# Patient Record
Sex: Female | Born: 1960 | Race: White | Hispanic: No | Marital: Married | Smoking: Never smoker
Health system: Southern US, Community
[De-identification: ages and names within clinical notes are randomized; demographics above are authoritative.]

## PROBLEM LIST (undated history)

## (undated) DIAGNOSIS — Z8489 Family history of other specified conditions: Secondary | ICD-10-CM

## (undated) DIAGNOSIS — K529 Noninfective gastroenteritis and colitis, unspecified: Secondary | ICD-10-CM

## (undated) DIAGNOSIS — Z923 Personal history of irradiation: Secondary | ICD-10-CM

## (undated) DIAGNOSIS — Z9221 Personal history of antineoplastic chemotherapy: Secondary | ICD-10-CM

## (undated) DIAGNOSIS — D1803 Hemangioma of intra-abdominal structures: Secondary | ICD-10-CM

## (undated) DIAGNOSIS — C50919 Malignant neoplasm of unspecified site of unspecified female breast: Secondary | ICD-10-CM

## (undated) DIAGNOSIS — F419 Anxiety disorder, unspecified: Secondary | ICD-10-CM

## (undated) DIAGNOSIS — K219 Gastro-esophageal reflux disease without esophagitis: Secondary | ICD-10-CM

## (undated) DIAGNOSIS — I10 Essential (primary) hypertension: Secondary | ICD-10-CM

## (undated) DIAGNOSIS — F32A Depression, unspecified: Secondary | ICD-10-CM

## (undated) DIAGNOSIS — K635 Polyp of colon: Secondary | ICD-10-CM

## (undated) HISTORY — DX: Hemangioma of intra-abdominal structures: D18.03

## (undated) HISTORY — PX: ABDOMINAL HYSTERECTOMY: SHX81

## (undated) HISTORY — PX: NASAL SINUS SURGERY: SHX719

## (undated) HISTORY — DX: Noninfective gastroenteritis and colitis, unspecified: K52.9

## (undated) HISTORY — PX: OTHER SURGICAL HISTORY: SHX169

## (undated) HISTORY — DX: Essential (primary) hypertension: I10

## (undated) HISTORY — DX: Polyp of colon: K63.5

---

## 2002-04-22 ENCOUNTER — Encounter: Payer: Self-pay | Admitting: Neurology

## 2002-04-22 ENCOUNTER — Encounter: Admission: RE | Admit: 2002-04-22 | Discharge: 2002-04-22 | Payer: Self-pay | Admitting: Neurology

## 2003-05-30 ENCOUNTER — Encounter: Admission: RE | Admit: 2003-05-30 | Discharge: 2003-05-30 | Payer: Self-pay | Admitting: Neurology

## 2004-07-30 ENCOUNTER — Encounter: Admission: RE | Admit: 2004-07-30 | Discharge: 2004-07-30 | Payer: Self-pay | Admitting: Neurology

## 2005-09-19 ENCOUNTER — Encounter: Admission: RE | Admit: 2005-09-19 | Discharge: 2005-09-19 | Payer: Self-pay | Admitting: Neurology

## 2006-10-07 ENCOUNTER — Encounter: Admission: RE | Admit: 2006-10-07 | Discharge: 2006-10-07 | Payer: Self-pay | Admitting: Neurology

## 2007-10-11 ENCOUNTER — Encounter: Admission: RE | Admit: 2007-10-11 | Discharge: 2007-10-11 | Payer: Self-pay | Admitting: Neurology

## 2008-04-07 DIAGNOSIS — K529 Noninfective gastroenteritis and colitis, unspecified: Secondary | ICD-10-CM

## 2008-04-07 DIAGNOSIS — D1803 Hemangioma of intra-abdominal structures: Secondary | ICD-10-CM

## 2008-04-07 HISTORY — DX: Hemangioma of intra-abdominal structures: D18.03

## 2008-04-07 HISTORY — DX: Noninfective gastroenteritis and colitis, unspecified: K52.9

## 2008-10-19 ENCOUNTER — Encounter: Admission: RE | Admit: 2008-10-19 | Discharge: 2008-10-19 | Payer: Self-pay | Admitting: Neurology

## 2009-01-21 ENCOUNTER — Emergency Department (HOSPITAL_COMMUNITY): Admission: EM | Admit: 2009-01-21 | Discharge: 2009-01-21 | Payer: Self-pay | Admitting: Emergency Medicine

## 2009-01-24 ENCOUNTER — Encounter (INDEPENDENT_AMBULATORY_CARE_PROVIDER_SITE_OTHER): Payer: Self-pay | Admitting: *Deleted

## 2009-11-07 ENCOUNTER — Encounter: Admission: RE | Admit: 2009-11-07 | Discharge: 2009-11-07 | Payer: Self-pay | Admitting: Neurology

## 2010-04-28 ENCOUNTER — Encounter: Payer: Self-pay | Admitting: Interventional Radiology

## 2010-07-11 LAB — CBC
Hemoglobin: 14 g/dL (ref 12.0–15.0)
MCV: 95.1 fL (ref 78.0–100.0)
Platelets: 295 10*3/uL (ref 150–400)
RBC: 4.31 MIL/uL (ref 3.87–5.11)
RDW: 13.1 % (ref 11.5–15.5)

## 2010-07-11 LAB — COMPREHENSIVE METABOLIC PANEL
Albumin: 4.2 g/dL (ref 3.5–5.2)
Chloride: 101 mEq/L (ref 96–112)
Creatinine, Ser: 0.86 mg/dL (ref 0.4–1.2)
Total Bilirubin: 1.3 mg/dL — ABNORMAL HIGH (ref 0.3–1.2)
Total Protein: 7.7 g/dL (ref 6.0–8.3)

## 2010-07-11 LAB — DIFFERENTIAL
Basophils Absolute: 0.2 10*3/uL — ABNORMAL HIGH (ref 0.0–0.1)
Eosinophils Absolute: 0 10*3/uL (ref 0.0–0.7)
Eosinophils Relative: 0 % (ref 0–5)
Lymphocytes Relative: 7 % — ABNORMAL LOW (ref 12–46)

## 2010-09-26 ENCOUNTER — Encounter: Payer: Self-pay | Admitting: Internal Medicine

## 2010-11-14 ENCOUNTER — Ambulatory Visit (INDEPENDENT_AMBULATORY_CARE_PROVIDER_SITE_OTHER): Payer: BLUE CROSS/BLUE SHIELD | Admitting: Internal Medicine

## 2010-11-14 ENCOUNTER — Encounter: Payer: Self-pay | Admitting: Internal Medicine

## 2010-11-14 DIAGNOSIS — K625 Hemorrhage of anus and rectum: Secondary | ICD-10-CM

## 2010-11-14 DIAGNOSIS — R198 Other specified symptoms and signs involving the digestive system and abdomen: Secondary | ICD-10-CM

## 2010-11-14 DIAGNOSIS — R933 Abnormal findings on diagnostic imaging of other parts of digestive tract: Secondary | ICD-10-CM

## 2010-11-14 DIAGNOSIS — R194 Change in bowel habit: Secondary | ICD-10-CM

## 2010-11-14 MED ORDER — PEG-KCL-NACL-NASULF-NA ASC-C 100 G PO SOLR: 1.0000 | Freq: Once | ORAL | Status: DC

## 2010-11-14 NOTE — Patient Instructions (Signed)
You have been scheduled for a colonoscopy. Please follow written instructions given to you at your visit today.  Please pick up your Moviprep kit at the pharmacy within the next 2-3 days. CC: Dr Marcy Siren

## 2010-11-14 NOTE — Progress Notes (Signed)
Erika [name] [birth year] MRN [id number]    History of Present Illness:  This is a 50 year old white female with a change in bowel habits toward loose and more frequent stools. She also sees mucous and blood per rectum. This has been occurring for about 6 months. There has been no weight loss or abdominal pain. She occasionally has rectal soreness and itching. She denies any history of hemorrhoids. Her father had colon polyps and underwent several colonoscopies with findings of hyperplastic and adenomatous polyps. She has a history of acute colitis dating back to 2010. A CT scan of the abdomen in the emergency room showed thickening of the left colon consistent with acute colitis. She takes Imodium when necessary.   Past Medical History  Diagnosis Date  . Hypertension   . Colitis 2010    seen on CT (at ER visit)  . Liver hemangioma 2010    seen on CT   Past Surgical History  Procedure Date  . Cesarean section   . "freezing of cervix"     reports that she has quit smoking. She has never used smokeless tobacco. She reports that she drinks alcohol. She reports that she does not use illicit drugs. family history includes Colon polyps in her father and sister; Diabetes in her paternal grandmother; Heart disease in her father; and Hypertension in her brother and father. Allergies  Allergen Reactions  . Penicillins Nausea Only        Review of Systems: Denies any upper GI symptoms of heartburn, dysphagia, negative for shortness of breath or chest pain  The remainder of the 10  point ROS is negative except as outlined in H&P   Physical Exam: General appearance  Well developed, in no distress. Eyes- non icteric. HEENT nontraumatic, normocephalic. Mouth no lesions, tongue papillated, no cheilosis. Neck supple without adenopathy, thyroid not enlarged, no carotid bruits, no JVD. Lungs Clear to auscultation bilaterally. Cor normal S1 normal S2, regular rhythm , no murmur,  quiet  precordium. Abdomen soft relaxed abdomen with no particular tenderness. No distention. Bowel sounds are normal. Liver edge at costal margin. Rectal: And anoscopic exam reveals normal perianal area. Normal rectal sphincter tone. Normal rectal ampulla with no evidence of proctitis or hemorrhoids. There is no prolapse of the mucosa. Stool is Hemoccult negative. Extremities no pedal edema. Skin no lesions. Neurological alert and oriented x 3. Psychological normal mood and affect.  Assessment and Plan:  Problem #1 Change in bowel habits and intermittent rectal bleeding not documented on today's exam. Patient has a recent history of an abnormal CT scan suggestive of acute colitis. I suspect either segmental colitis or a left colon polyp which produces some mucus. She will be 50 years old this year this year and has a family history of colon polyps. He will proceed with a colonoscopy. I have discussed the procedure, the sedation and the prep with the patient.   11/14/2010 Lina Sar

## 2010-12-06 ENCOUNTER — Other Ambulatory Visit: Payer: Self-pay | Admitting: Neurology

## 2010-12-06 DIAGNOSIS — Z1231 Encounter for screening mammogram for malignant neoplasm of breast: Secondary | ICD-10-CM

## 2010-12-13 ENCOUNTER — Ambulatory Visit (AMBULATORY_SURGERY_CENTER): Payer: BLUE CROSS/BLUE SHIELD | Admitting: Internal Medicine

## 2010-12-13 ENCOUNTER — Encounter: Payer: Self-pay | Admitting: Internal Medicine

## 2010-12-13 DIAGNOSIS — D126 Benign neoplasm of colon, unspecified: Secondary | ICD-10-CM

## 2010-12-13 DIAGNOSIS — K625 Hemorrhage of anus and rectum: Secondary | ICD-10-CM

## 2010-12-13 DIAGNOSIS — R198 Other specified symptoms and signs involving the digestive system and abdomen: Secondary | ICD-10-CM

## 2010-12-13 DIAGNOSIS — Z8371 Family history of colonic polyps: Secondary | ICD-10-CM

## 2010-12-13 MED ORDER — SODIUM CHLORIDE 0.9 % IV SOLN: 500.0000 mL | INTRAVENOUS | Status: DC

## 2010-12-13 NOTE — Progress Notes (Signed)
PT IS REQUESTING A PEDIATRIC SCOPE IF POSSIBLE. EWM

## 2010-12-13 NOTE — Patient Instructions (Signed)
RESUME ALL MEDICATIONS. INFORMATION GIVEN ON POLYPS AND HIGH FIBER DIET. INFORMATION ON IBS GIVEN. D/C INSTRUCTIONS GIVEN.

## 2010-12-16 ENCOUNTER — Telehealth: Payer: Self-pay | Admitting: *Deleted

## 2010-12-16 NOTE — Telephone Encounter (Signed)
Identifier on home phone, message left.

## 2010-12-18 ENCOUNTER — Encounter: Payer: Self-pay | Admitting: Internal Medicine

## 2010-12-26 ENCOUNTER — Ambulatory Visit: Payer: BLUE CROSS/BLUE SHIELD

## 2010-12-30 ENCOUNTER — Ambulatory Visit: Payer: BLUE CROSS/BLUE SHIELD

## 2010-12-31 ENCOUNTER — Ambulatory Visit: Payer: BLUE CROSS/BLUE SHIELD

## 2011-01-06 ENCOUNTER — Ambulatory Visit
Admission: RE | Admit: 2011-01-06 | Discharge: 2011-01-06 | Disposition: A | Discharge status: Home / Self Care | Payer: BC Managed Care – PPO | Source: Ambulatory Visit | Attending: Neurology | Admitting: Neurology

## 2011-01-06 DIAGNOSIS — Z1231 Encounter for screening mammogram for malignant neoplasm of breast: Secondary | ICD-10-CM

## 2011-12-29 ENCOUNTER — Other Ambulatory Visit: Payer: Self-pay | Admitting: Neurology

## 2011-12-29 DIAGNOSIS — Z1231 Encounter for screening mammogram for malignant neoplasm of breast: Secondary | ICD-10-CM

## 2012-01-13 ENCOUNTER — Ambulatory Visit
Admission: RE | Admit: 2012-01-13 | Discharge: 2012-01-13 | Disposition: A | Discharge status: Home / Self Care | Payer: BC Managed Care – PPO | Source: Ambulatory Visit | Attending: Neurology | Admitting: Neurology

## 2012-01-13 DIAGNOSIS — Z1231 Encounter for screening mammogram for malignant neoplasm of breast: Secondary | ICD-10-CM

## 2012-12-29 ENCOUNTER — Other Ambulatory Visit: Payer: Self-pay

## 2012-12-29 DIAGNOSIS — Z1231 Encounter for screening mammogram for malignant neoplasm of breast: Secondary | ICD-10-CM

## 2013-01-14 ENCOUNTER — Ambulatory Visit: Payer: Self-pay

## 2013-02-01 ENCOUNTER — Ambulatory Visit
Admission: RE | Admit: 2013-02-01 | Discharge: 2013-02-01 | Disposition: A | Discharge status: Home / Self Care | Payer: BC Managed Care – PPO | Source: Ambulatory Visit

## 2013-02-01 DIAGNOSIS — Z1231 Encounter for screening mammogram for malignant neoplasm of breast: Secondary | ICD-10-CM

## 2013-05-17 ENCOUNTER — Encounter: Payer: Self-pay | Admitting: *Deleted

## 2013-05-31 ENCOUNTER — Encounter: Payer: Self-pay | Admitting: Internal Medicine

## 2013-05-31 ENCOUNTER — Ambulatory Visit (INDEPENDENT_AMBULATORY_CARE_PROVIDER_SITE_OTHER): Payer: BC Managed Care – PPO | Admitting: Internal Medicine

## 2013-05-31 VITALS — BP 114/72 | HR 100 | Ht 67.0 in | Wt 176.4 lb

## 2013-05-31 DIAGNOSIS — K219 Gastro-esophageal reflux disease without esophagitis: Secondary | ICD-10-CM

## 2013-05-31 DIAGNOSIS — K625 Hemorrhage of anus and rectum: Secondary | ICD-10-CM

## 2013-05-31 MED ORDER — HYDROCORTISONE ACETATE 25 MG RE SUPP: 25.0000 mg | Freq: Every day | RECTAL | Status: DC

## 2013-05-31 NOTE — Progress Notes (Addendum)
ELANTRA CAPRARA 03-06-1961 [id number]  Note: This dictation was prepared with Dragon digital system. Any transcriptional errors that result from this procedure are unintentional.   History of Present Illness:  This is a 53 year old white female with intermittent low-volume rectal bleeding at times mixed with mucus. There is occasional abdominal discomfort as well as rectal discomfort. She underwent a screening colonoscopy in September 2012 because of her family history of colon polyps in her father and heme p[ositive stool. She was found to have 2 rectal polyps, both hyperplastic. Her last episode of rectal bleeding occurred 10 days ago. Her bowel habits are more frequent than usual but there has been no diarrhea. Her weight has been stable. She has occasional heartburn and dyspepsia. There is a family history of gallbladder disease in her mother.    Past Medical History  Diagnosis Date  . Hypertension   . Colitis 2010    seen on CT (at ER visit)  . Liver hemangioma 2010    seen on CT  . Hyperplastic colon polyp     Past Surgical History  Procedure Laterality Date  . Cesarean section    . "freezing of cervix"      Allergies  Allergen Reactions  . Penicillins Nausea Only    Family history and social history have been reviewed.  Review of Systems: Small-volume hematochezia  The remainder of the 10 point ROS is negative except as outlined in the H&P  Physical Exam: General Appearance Well developed, in no distress Eyes  Non icteric  HEENT  Non traumatic, normocephalic  Mouth No lesion, tongue papillated, no cheilosis Neck Supple without adenopathy, thyroid not enlarged, no carotid bruits, no JVD Lungs Clear to auscultation bilaterally COR Normal S1, normal S2, regular rhythm, no murmur, quiet precordium Abdomen soft nontender abdomen with normal active bowel sounds. No distention. Liver edge at costal margin. Lower abdomen normal Rectal and anoscopic exam reveals normal  perianal area. Normal rectal sphincter tone. No internal hemorrhoids. No evidence of proctitis. No stool in the ampulla Extremities  No pedal edema Skin No lesions Neurological Alert and oriented x 3 Psychological Normal mood and affect  Assessment and Plan:   Problem #1 Low-volume hematochezia in a 53 year old female who is up-to-date on her colonoscopy. Today's anoscopy exam fails to reveal a source of bleeding. From the description of the bleeding as well as and from the fact that she had recently a normal colonoscopy, I feel that she most likely has an anorectal source of bleeding. I suggest  empirical treatment with Anusol-HC suppositories and for her to start on Benefiber one heaping teaspoon daily to regulate her bowel habits. We will give her Hemoccult cards after she completes the course of suppositories. If  bleeding continues, I will consider repeating her colonoscopy.  Problem #2 Dyspepsia and gastroesophageal reflux. She takes Ranitidine 150 mg OTC on an as necessary basis. If symptoms become progressive or if she doesn't respond to PPIs, I suggest an upper endoscopy and upper abdominal ultrasound. I have discussed antireflux measures including dietary modifications and head of the bed elevation.  Delfin Edis 05/31/2013

## 2013-05-31 NOTE — Patient Instructions (Signed)
We have sent the following medications to your pharmacy for you to pick up at your convenience: Anusol Suppositories  Please purchase the following medications over the counter and take as directed: Benefiber 1 heaping teaspoon daily  Please complete hemoccult cards as directed and return to the lab.  CC: Dr Doreene Burke Nnodi

## 2014-01-10 ENCOUNTER — Other Ambulatory Visit: Payer: Self-pay

## 2014-01-10 DIAGNOSIS — Z1231 Encounter for screening mammogram for malignant neoplasm of breast: Secondary | ICD-10-CM

## 2014-02-02 ENCOUNTER — Encounter (INDEPENDENT_AMBULATORY_CARE_PROVIDER_SITE_OTHER): Payer: Self-pay

## 2014-02-02 ENCOUNTER — Ambulatory Visit
Admission: RE | Admit: 2014-02-02 | Discharge: 2014-02-02 | Disposition: A | Discharge status: Home / Self Care | Payer: BC Managed Care – PPO | Source: Ambulatory Visit

## 2014-02-02 DIAGNOSIS — Z1231 Encounter for screening mammogram for malignant neoplasm of breast: Secondary | ICD-10-CM

## 2014-02-03 ENCOUNTER — Ambulatory Visit: Payer: BC Managed Care – PPO

## 2014-02-08 ENCOUNTER — Ambulatory Visit: Payer: BC Managed Care – PPO

## 2015-01-18 ENCOUNTER — Other Ambulatory Visit: Payer: Self-pay

## 2015-01-18 DIAGNOSIS — Z1231 Encounter for screening mammogram for malignant neoplasm of breast: Secondary | ICD-10-CM

## 2015-02-06 ENCOUNTER — Ambulatory Visit
Admission: RE | Admit: 2015-02-06 | Discharge: 2015-02-06 | Disposition: A | Discharge status: Home / Self Care | Payer: BLUE CROSS/BLUE SHIELD | Source: Ambulatory Visit

## 2015-02-06 DIAGNOSIS — Z1231 Encounter for screening mammogram for malignant neoplasm of breast: Secondary | ICD-10-CM

## 2015-02-08 ENCOUNTER — Other Ambulatory Visit: Payer: Self-pay | Admitting: Neurology

## 2015-02-08 DIAGNOSIS — R928 Other abnormal and inconclusive findings on diagnostic imaging of breast: Secondary | ICD-10-CM

## 2015-02-13 ENCOUNTER — Other Ambulatory Visit: Payer: Self-pay | Admitting: Neurology

## 2015-02-13 ENCOUNTER — Ambulatory Visit
Admission: RE | Admit: 2015-02-13 | Discharge: 2015-02-13 | Disposition: A | Discharge status: Home / Self Care | Payer: BLUE CROSS/BLUE SHIELD | Source: Ambulatory Visit | Attending: Neurology | Admitting: Neurology

## 2015-02-13 DIAGNOSIS — R928 Other abnormal and inconclusive findings on diagnostic imaging of breast: Secondary | ICD-10-CM

## 2015-07-31 ENCOUNTER — Other Ambulatory Visit: Payer: Self-pay | Admitting: Neurology

## 2015-07-31 DIAGNOSIS — R921 Mammographic calcification found on diagnostic imaging of breast: Secondary | ICD-10-CM

## 2015-08-14 ENCOUNTER — Ambulatory Visit
Admission: RE | Admit: 2015-08-14 | Discharge: 2015-08-14 | Disposition: A | Discharge status: Home / Self Care | Payer: BLUE CROSS/BLUE SHIELD | Source: Ambulatory Visit | Attending: Neurology | Admitting: Neurology

## 2015-08-14 DIAGNOSIS — R921 Mammographic calcification found on diagnostic imaging of breast: Secondary | ICD-10-CM | POA: Diagnosis not present

## 2015-12-25 DIAGNOSIS — L03032 Cellulitis of left toe: Secondary | ICD-10-CM | POA: Diagnosis not present

## 2016-01-14 ENCOUNTER — Other Ambulatory Visit: Payer: Self-pay | Admitting: Urology

## 2016-01-14 ENCOUNTER — Other Ambulatory Visit: Payer: Self-pay

## 2016-01-14 DIAGNOSIS — R921 Mammographic calcification found on diagnostic imaging of breast: Secondary | ICD-10-CM

## 2016-01-22 ENCOUNTER — Ambulatory Visit: Payer: BLUE CROSS/BLUE SHIELD | Admitting: Sports Medicine

## 2016-01-25 DIAGNOSIS — R079 Chest pain, unspecified: Secondary | ICD-10-CM | POA: Diagnosis not present

## 2016-01-28 DIAGNOSIS — R079 Chest pain, unspecified: Secondary | ICD-10-CM | POA: Diagnosis not present

## 2016-02-07 ENCOUNTER — Ambulatory Visit
Admission: RE | Admit: 2016-02-07 | Discharge: 2016-02-07 | Disposition: A | Discharge status: Home / Self Care | Payer: BLUE CROSS/BLUE SHIELD | Source: Ambulatory Visit | Attending: Urology | Admitting: Urology

## 2016-02-07 DIAGNOSIS — R921 Mammographic calcification found on diagnostic imaging of breast: Secondary | ICD-10-CM

## 2016-02-11 DIAGNOSIS — N951 Menopausal and female climacteric states: Secondary | ICD-10-CM | POA: Diagnosis not present

## 2016-02-11 DIAGNOSIS — Z01411 Encounter for gynecological examination (general) (routine) with abnormal findings: Secondary | ICD-10-CM | POA: Diagnosis not present

## 2016-02-14 ENCOUNTER — Other Ambulatory Visit: Payer: Self-pay | Admitting: Cardiology

## 2016-02-14 DIAGNOSIS — E781 Pure hyperglyceridemia: Secondary | ICD-10-CM | POA: Diagnosis not present

## 2016-02-14 DIAGNOSIS — Z8249 Family history of ischemic heart disease and other diseases of the circulatory system: Secondary | ICD-10-CM | POA: Diagnosis not present

## 2016-02-14 DIAGNOSIS — R0789 Other chest pain: Secondary | ICD-10-CM | POA: Diagnosis not present

## 2016-02-14 DIAGNOSIS — R079 Chest pain, unspecified: Secondary | ICD-10-CM

## 2016-02-14 DIAGNOSIS — I10 Essential (primary) hypertension: Secondary | ICD-10-CM | POA: Diagnosis not present

## 2016-02-22 ENCOUNTER — Ambulatory Visit
Admission: RE | Admit: 2016-02-22 | Discharge: 2016-02-22 | Disposition: A | Discharge status: Home / Self Care | Payer: No Typology Code available for payment source | Source: Ambulatory Visit | Attending: Cardiology | Admitting: Cardiology

## 2016-02-22 ENCOUNTER — Other Ambulatory Visit: Payer: BLUE CROSS/BLUE SHIELD

## 2016-02-22 DIAGNOSIS — R079 Chest pain, unspecified: Secondary | ICD-10-CM

## 2016-03-25 DIAGNOSIS — I10 Essential (primary) hypertension: Secondary | ICD-10-CM | POA: Diagnosis not present

## 2016-03-25 DIAGNOSIS — F419 Anxiety disorder, unspecified: Secondary | ICD-10-CM | POA: Diagnosis not present

## 2016-03-25 DIAGNOSIS — E781 Pure hyperglyceridemia: Secondary | ICD-10-CM | POA: Diagnosis not present

## 2016-05-12 DIAGNOSIS — Z23 Encounter for immunization: Secondary | ICD-10-CM | POA: Diagnosis not present

## 2017-01-06 ENCOUNTER — Other Ambulatory Visit: Payer: Self-pay | Admitting: Urology

## 2017-01-06 DIAGNOSIS — Z1231 Encounter for screening mammogram for malignant neoplasm of breast: Secondary | ICD-10-CM

## 2017-02-09 ENCOUNTER — Ambulatory Visit
Admission: RE | Admit: 2017-02-09 | Discharge: 2017-02-09 | Disposition: A | Discharge status: Home / Self Care | Payer: BLUE CROSS/BLUE SHIELD | Source: Ambulatory Visit | Attending: Urology | Admitting: Urology

## 2017-02-09 DIAGNOSIS — Z1231 Encounter for screening mammogram for malignant neoplasm of breast: Secondary | ICD-10-CM | POA: Diagnosis not present

## 2017-02-16 DIAGNOSIS — N951 Menopausal and female climacteric states: Secondary | ICD-10-CM | POA: Diagnosis not present

## 2017-02-16 DIAGNOSIS — Z01411 Encounter for gynecological examination (general) (routine) with abnormal findings: Secondary | ICD-10-CM | POA: Diagnosis not present

## 2017-05-19 DIAGNOSIS — E781 Pure hyperglyceridemia: Secondary | ICD-10-CM | POA: Diagnosis not present

## 2017-05-19 DIAGNOSIS — I10 Essential (primary) hypertension: Secondary | ICD-10-CM | POA: Diagnosis not present

## 2017-05-19 DIAGNOSIS — F419 Anxiety disorder, unspecified: Secondary | ICD-10-CM | POA: Diagnosis not present

## 2017-06-16 DIAGNOSIS — J029 Acute pharyngitis, unspecified: Secondary | ICD-10-CM | POA: Diagnosis not present

## 2017-10-14 DIAGNOSIS — I10 Essential (primary) hypertension: Secondary | ICD-10-CM | POA: Diagnosis not present

## 2017-10-14 DIAGNOSIS — R0789 Other chest pain: Secondary | ICD-10-CM | POA: Diagnosis not present

## 2017-10-14 DIAGNOSIS — Z8249 Family history of ischemic heart disease and other diseases of the circulatory system: Secondary | ICD-10-CM | POA: Diagnosis not present

## 2017-10-27 DIAGNOSIS — I10 Essential (primary) hypertension: Secondary | ICD-10-CM | POA: Diagnosis not present

## 2017-10-27 DIAGNOSIS — R0789 Other chest pain: Secondary | ICD-10-CM | POA: Diagnosis not present

## 2017-10-27 DIAGNOSIS — R9439 Abnormal result of other cardiovascular function study: Secondary | ICD-10-CM | POA: Diagnosis not present

## 2017-10-27 DIAGNOSIS — E781 Pure hyperglyceridemia: Secondary | ICD-10-CM | POA: Diagnosis not present

## 2017-11-03 ENCOUNTER — Other Ambulatory Visit: Payer: Self-pay | Admitting: Cardiology

## 2017-11-03 DIAGNOSIS — Z8249 Family history of ischemic heart disease and other diseases of the circulatory system: Secondary | ICD-10-CM

## 2017-11-03 DIAGNOSIS — R9430 Abnormal result of cardiovascular function study, unspecified: Secondary | ICD-10-CM

## 2017-11-27 ENCOUNTER — Ambulatory Visit (HOSPITAL_COMMUNITY)
Admission: RE | Admit: 2017-11-27 | Discharge: 2017-11-27 | Disposition: A | Discharge status: Home / Self Care | Payer: BLUE CROSS/BLUE SHIELD | Source: Ambulatory Visit | Attending: Cardiology | Admitting: Cardiology

## 2017-11-27 DIAGNOSIS — R948 Abnormal results of function studies of other organs and systems: Secondary | ICD-10-CM | POA: Insufficient documentation

## 2017-11-27 DIAGNOSIS — I2510 Atherosclerotic heart disease of native coronary artery without angina pectoris: Secondary | ICD-10-CM | POA: Insufficient documentation

## 2017-11-27 DIAGNOSIS — R9430 Abnormal result of cardiovascular function study, unspecified: Secondary | ICD-10-CM

## 2017-11-27 DIAGNOSIS — Z8249 Family history of ischemic heart disease and other diseases of the circulatory system: Secondary | ICD-10-CM | POA: Diagnosis not present

## 2017-11-27 DIAGNOSIS — R0789 Other chest pain: Secondary | ICD-10-CM | POA: Diagnosis not present

## 2017-11-27 MED ORDER — IOPAMIDOL (ISOVUE-370) INJECTION 76%
100.0000 mL | Freq: Once | INTRAVENOUS | Status: AC | PRN
  Administered 2017-11-27: 80 mL via INTRAVENOUS

## 2017-11-27 MED ORDER — NITROGLYCERIN 0.4 MG SL SUBL
0.8000 mg | SUBLINGUAL_TABLET | Freq: Once | SUBLINGUAL | Status: AC
  Administered 2017-11-27: 0.8 mg via SUBLINGUAL
  Filled 2017-11-27: qty 25

## 2017-11-27 MED ORDER — NITROGLYCERIN 0.4 MG SL SUBL
SUBLINGUAL_TABLET | SUBLINGUAL | Status: AC
  Filled 2017-11-27: qty 2

## 2017-11-27 MED ORDER — METOPROLOL TARTRATE 5 MG/5ML IV SOLN
10.0000 mg | Freq: Once | INTRAVENOUS | Status: AC
  Administered 2017-11-27: 5 mg via INTRAVENOUS
  Filled 2017-11-27: qty 10

## 2017-11-27 MED ORDER — METOPROLOL TARTRATE 5 MG/5ML IV SOLN
INTRAVENOUS | Status: AC
Start: 2017-11-27 — End: 2017-11-27
  Filled 2017-11-27: qty 10

## 2017-11-27 MED ORDER — IOPAMIDOL (ISOVUE-370) INJECTION 76%
INTRAVENOUS | Status: AC
  Filled 2017-11-27: qty 200

## 2017-12-03 DIAGNOSIS — R1032 Left lower quadrant pain: Secondary | ICD-10-CM | POA: Diagnosis not present

## 2017-12-04 DIAGNOSIS — I10 Essential (primary) hypertension: Secondary | ICD-10-CM | POA: Diagnosis not present

## 2017-12-04 DIAGNOSIS — I2510 Atherosclerotic heart disease of native coronary artery without angina pectoris: Secondary | ICD-10-CM | POA: Diagnosis not present

## 2017-12-04 DIAGNOSIS — R0789 Other chest pain: Secondary | ICD-10-CM | POA: Diagnosis not present

## 2017-12-04 DIAGNOSIS — R9439 Abnormal result of other cardiovascular function study: Secondary | ICD-10-CM | POA: Diagnosis not present

## 2017-12-17 DIAGNOSIS — I2510 Atherosclerotic heart disease of native coronary artery without angina pectoris: Secondary | ICD-10-CM | POA: Diagnosis not present

## 2017-12-17 DIAGNOSIS — I10 Essential (primary) hypertension: Secondary | ICD-10-CM | POA: Diagnosis not present

## 2018-01-11 ENCOUNTER — Other Ambulatory Visit: Payer: Self-pay | Admitting: Obstetrics and Gynecology

## 2018-01-11 DIAGNOSIS — Z1231 Encounter for screening mammogram for malignant neoplasm of breast: Secondary | ICD-10-CM

## 2018-01-14 DIAGNOSIS — E781 Pure hyperglyceridemia: Secondary | ICD-10-CM | POA: Diagnosis not present

## 2018-01-14 DIAGNOSIS — I10 Essential (primary) hypertension: Secondary | ICD-10-CM | POA: Diagnosis not present

## 2018-01-14 DIAGNOSIS — E7800 Pure hypercholesterolemia, unspecified: Secondary | ICD-10-CM | POA: Diagnosis not present

## 2018-01-14 DIAGNOSIS — I2510 Atherosclerotic heart disease of native coronary artery without angina pectoris: Secondary | ICD-10-CM | POA: Diagnosis not present

## 2018-02-09 ENCOUNTER — Ambulatory Visit: Payer: BLUE CROSS/BLUE SHIELD

## 2018-03-09 DIAGNOSIS — E782 Mixed hyperlipidemia: Secondary | ICD-10-CM | POA: Diagnosis not present

## 2018-03-09 DIAGNOSIS — I10 Essential (primary) hypertension: Secondary | ICD-10-CM | POA: Diagnosis not present

## 2018-03-09 DIAGNOSIS — I2510 Atherosclerotic heart disease of native coronary artery without angina pectoris: Secondary | ICD-10-CM | POA: Diagnosis not present

## 2018-03-09 DIAGNOSIS — Z8249 Family history of ischemic heart disease and other diseases of the circulatory system: Secondary | ICD-10-CM | POA: Diagnosis not present

## 2018-03-23 ENCOUNTER — Ambulatory Visit
Admission: RE | Admit: 2018-03-23 | Discharge: 2018-03-23 | Disposition: A | Discharge status: Home / Self Care | Payer: BLUE CROSS/BLUE SHIELD | Source: Ambulatory Visit | Attending: Obstetrics and Gynecology | Admitting: Obstetrics and Gynecology

## 2018-03-23 DIAGNOSIS — Z1231 Encounter for screening mammogram for malignant neoplasm of breast: Secondary | ICD-10-CM

## 2018-04-20 DIAGNOSIS — H52223 Regular astigmatism, bilateral: Secondary | ICD-10-CM | POA: Diagnosis not present

## 2018-04-20 DIAGNOSIS — H5203 Hypermetropia, bilateral: Secondary | ICD-10-CM | POA: Diagnosis not present

## 2018-04-20 DIAGNOSIS — H524 Presbyopia: Secondary | ICD-10-CM | POA: Diagnosis not present

## 2018-05-04 DIAGNOSIS — Z6827 Body mass index (BMI) 27.0-27.9, adult: Secondary | ICD-10-CM | POA: Diagnosis not present

## 2018-05-04 DIAGNOSIS — Z1151 Encounter for screening for human papillomavirus (HPV): Secondary | ICD-10-CM | POA: Diagnosis not present

## 2018-05-04 DIAGNOSIS — Z01419 Encounter for gynecological examination (general) (routine) without abnormal findings: Secondary | ICD-10-CM | POA: Diagnosis not present

## 2018-07-22 DIAGNOSIS — I10 Essential (primary) hypertension: Secondary | ICD-10-CM | POA: Diagnosis not present

## 2018-07-22 DIAGNOSIS — E781 Pure hyperglyceridemia: Secondary | ICD-10-CM | POA: Diagnosis not present

## 2018-07-22 DIAGNOSIS — E7800 Pure hypercholesterolemia, unspecified: Secondary | ICD-10-CM | POA: Diagnosis not present

## 2018-07-22 DIAGNOSIS — I2510 Atherosclerotic heart disease of native coronary artery without angina pectoris: Secondary | ICD-10-CM | POA: Diagnosis not present

## 2019-02-22 ENCOUNTER — Other Ambulatory Visit: Payer: Self-pay | Admitting: Obstetrics and Gynecology

## 2019-02-22 DIAGNOSIS — Z1231 Encounter for screening mammogram for malignant neoplasm of breast: Secondary | ICD-10-CM

## 2019-03-29 ENCOUNTER — Other Ambulatory Visit: Payer: Self-pay

## 2019-04-19 ENCOUNTER — Ambulatory Visit: Payer: BLUE CROSS/BLUE SHIELD

## 2019-05-09 ENCOUNTER — Other Ambulatory Visit: Payer: Self-pay

## 2019-05-09 ENCOUNTER — Ambulatory Visit
Admission: RE | Admit: 2019-05-09 | Discharge: 2019-05-09 | Disposition: A | Discharge status: Home / Self Care | Payer: 59 | Source: Ambulatory Visit | Attending: Obstetrics and Gynecology | Admitting: Obstetrics and Gynecology

## 2019-05-09 DIAGNOSIS — Z1231 Encounter for screening mammogram for malignant neoplasm of breast: Secondary | ICD-10-CM

## 2020-05-03 ENCOUNTER — Other Ambulatory Visit: Payer: Self-pay | Admitting: Obstetrics and Gynecology

## 2020-05-03 DIAGNOSIS — Z1231 Encounter for screening mammogram for malignant neoplasm of breast: Secondary | ICD-10-CM

## 2020-05-24 ENCOUNTER — Ambulatory Visit
Admission: RE | Admit: 2020-05-24 | Discharge: 2020-05-24 | Disposition: A | Discharge status: Home / Self Care | Payer: 59 | Source: Ambulatory Visit

## 2020-05-24 ENCOUNTER — Other Ambulatory Visit: Payer: Self-pay

## 2020-05-24 DIAGNOSIS — Z1231 Encounter for screening mammogram for malignant neoplasm of breast: Secondary | ICD-10-CM

## 2020-08-02 IMAGING — MG DIGITAL SCREENING BILATERAL MAMMOGRAM WITH TOMO AND CAD
8 series · 8 of 24 positions shown · non-contrast
Comparison: Previous exam(s).

CLINICAL DATA: Screening.

EXAM:
DIGITAL SCREENING BILATERAL MAMMOGRAM WITH TOMO AND CAD

[L MLO synth-2D]
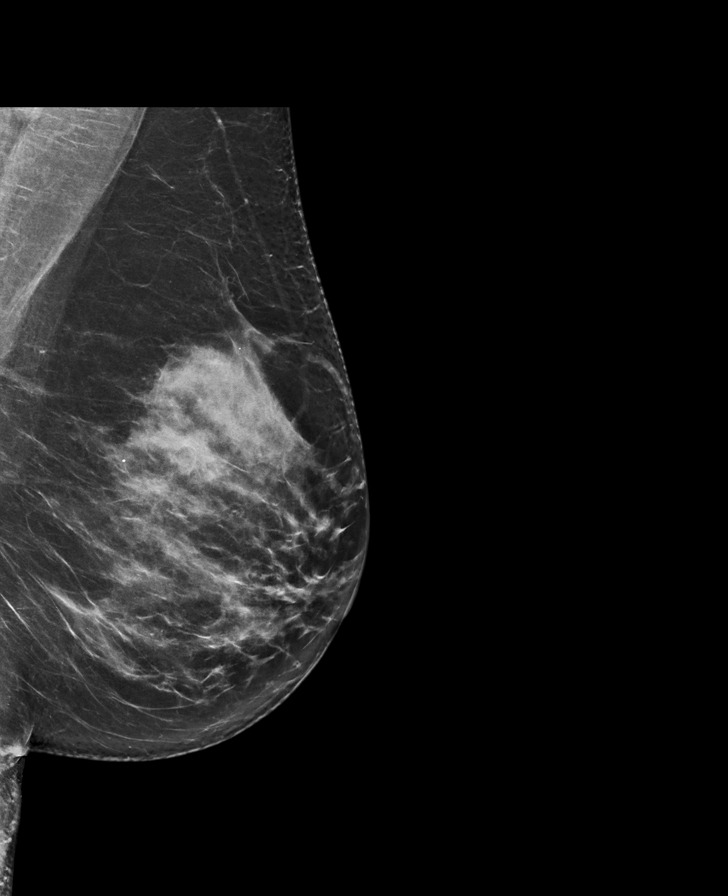

[R CC synth-2D]
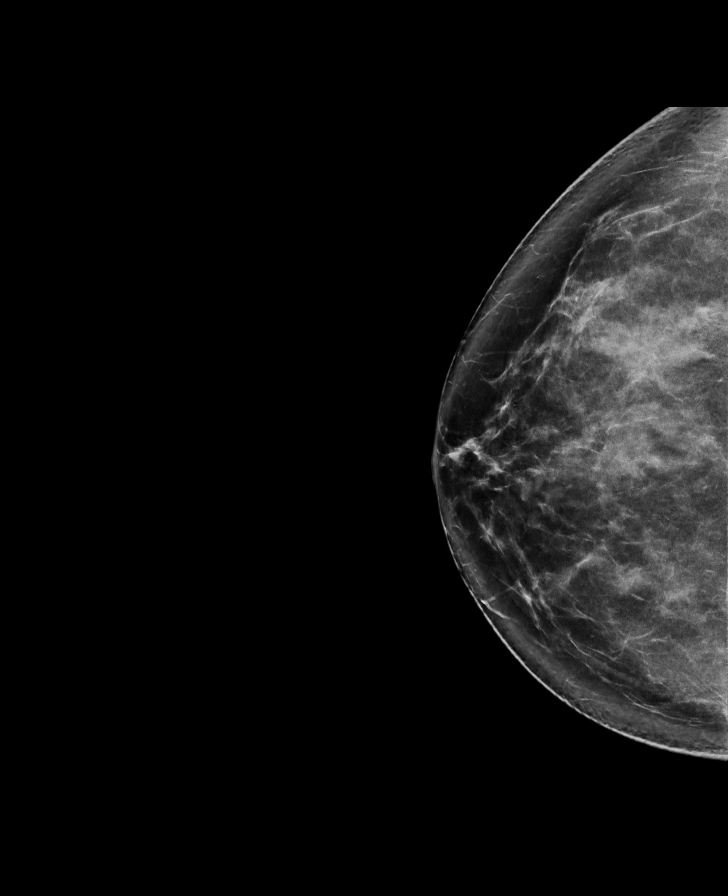

[L CC synth-2D]
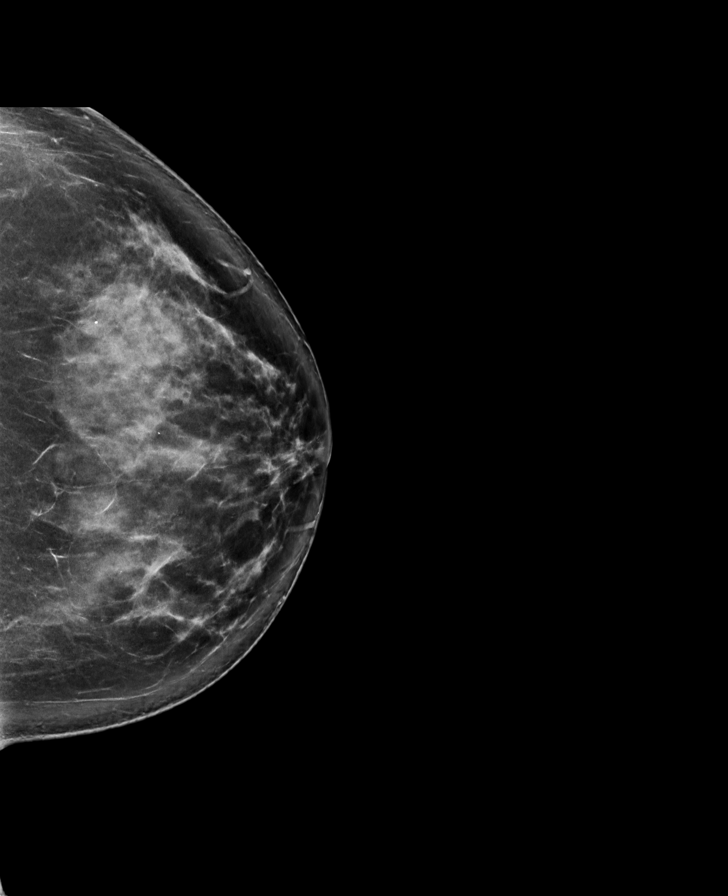

[R MLO synth-2D]
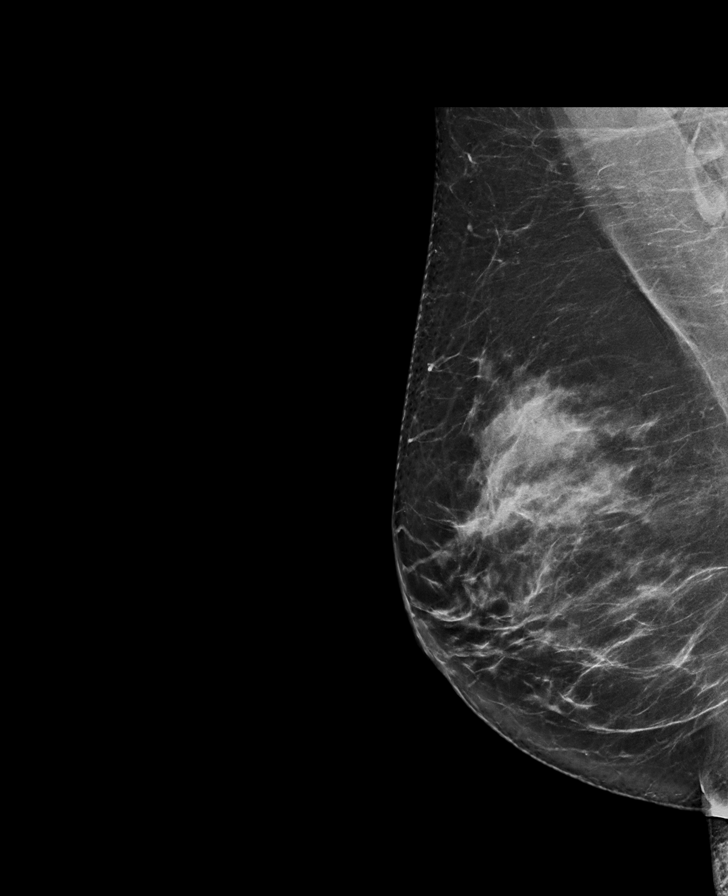

[L MLO tomo · tomo slice 45/88.0]
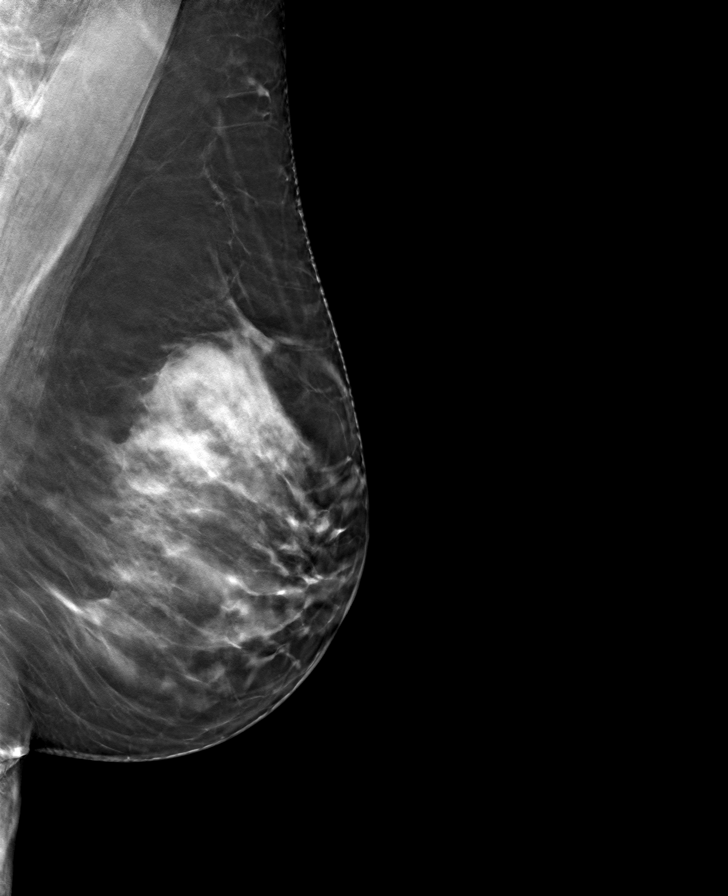

[R MLO tomo · tomo slice 45/89.0]
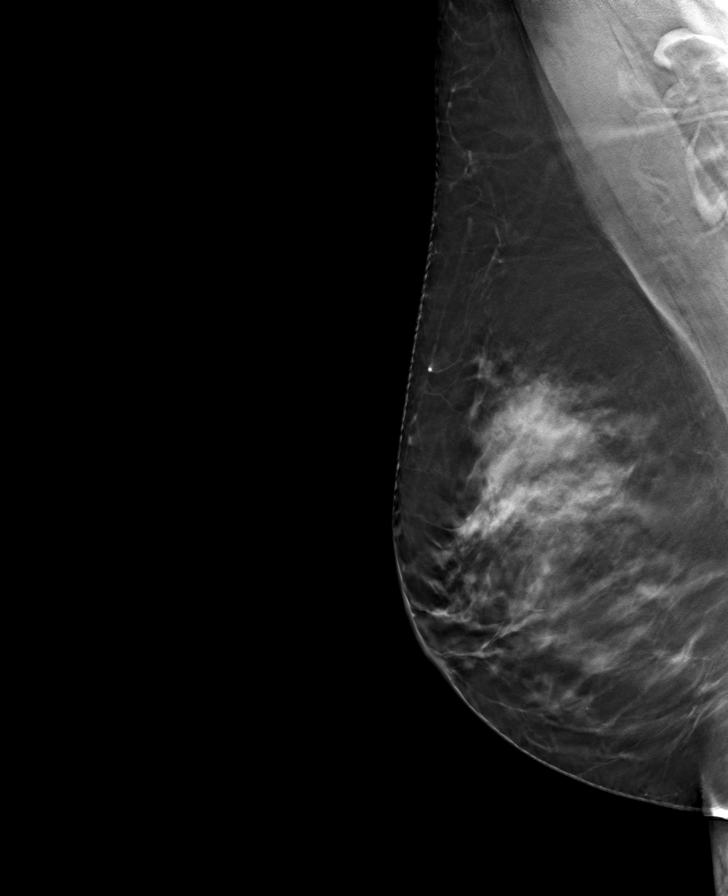

[R CC tomo · tomo slice 47/92.0]
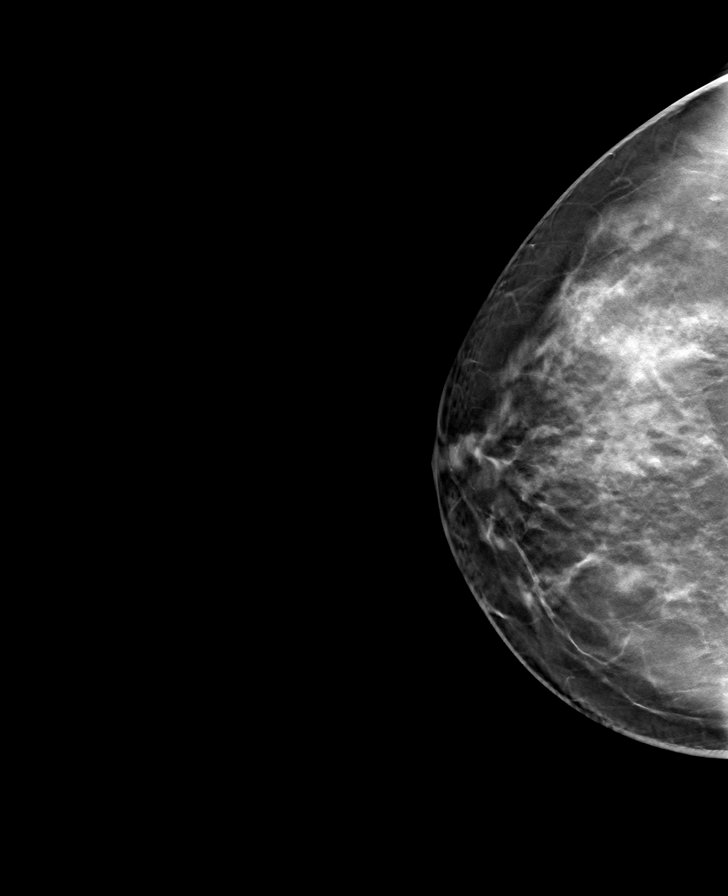

[L CC tomo · tomo slice 47/93.0]
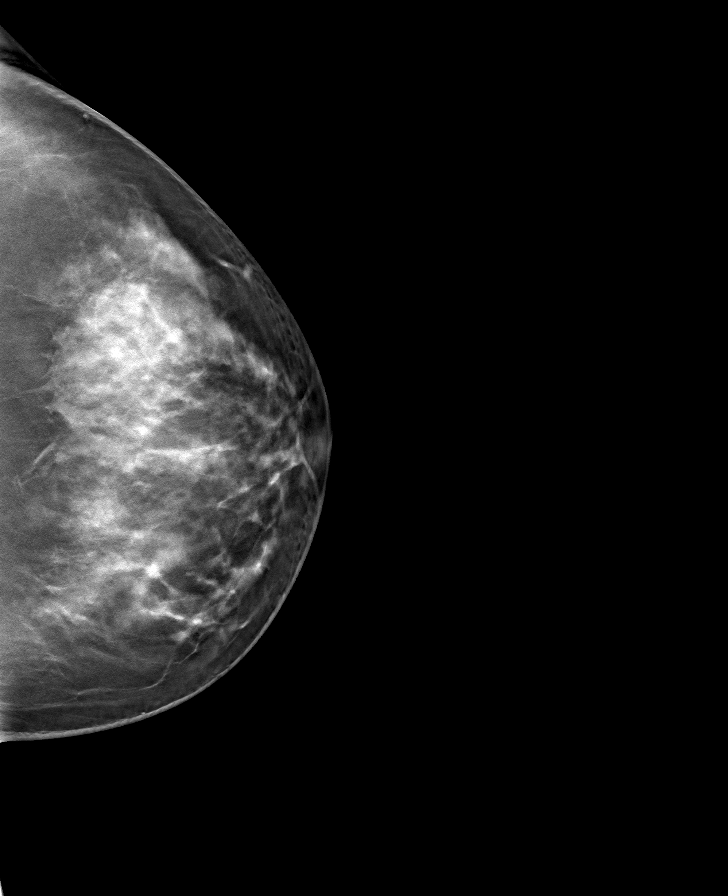

[8 of 24 positions shown; findings below may reference images not displayed]

ACR Breast Density Category c: The breast tissue is heterogeneously
dense, which may obscure small masses.
FINDINGS: There are no findings suspicious for malignancy. Images were
processed with CAD.
IMPRESSION: No mammographic evidence of malignancy. A result letter of this
screening mammogram will be mailed directly to the patient.

RECOMMENDATION:
Screening mammogram in one year. (Code:FT-U-LHB)

BI-RADS CATEGORY  1: Negative.

## 2021-01-15 ENCOUNTER — Encounter: Payer: Self-pay | Admitting: Gastroenterology

## 2021-04-11 ENCOUNTER — Other Ambulatory Visit: Payer: Self-pay | Admitting: Obstetrics and Gynecology

## 2021-04-11 DIAGNOSIS — Z1231 Encounter for screening mammogram for malignant neoplasm of breast: Secondary | ICD-10-CM

## 2021-05-07 ENCOUNTER — Ambulatory Visit
Admission: RE | Admit: 2021-05-07 | Discharge: 2021-05-07 | Disposition: A | Discharge status: Home / Self Care | Payer: 59 | Source: Ambulatory Visit

## 2021-05-07 DIAGNOSIS — Z1231 Encounter for screening mammogram for malignant neoplasm of breast: Secondary | ICD-10-CM

## 2021-05-28 ENCOUNTER — Ambulatory Visit: Payer: 59

## 2021-06-03 ENCOUNTER — Ambulatory Visit: Payer: 59

## 2021-06-06 ENCOUNTER — Other Ambulatory Visit: Payer: Self-pay

## 2021-06-06 ENCOUNTER — Ambulatory Visit
Admission: RE | Admit: 2021-06-06 | Discharge: 2021-06-06 | Disposition: A | Discharge status: Home / Self Care | Payer: 59 | Source: Ambulatory Visit | Attending: Obstetrics and Gynecology | Admitting: Obstetrics and Gynecology

## 2022-04-30 ENCOUNTER — Other Ambulatory Visit: Payer: Self-pay | Admitting: Obstetrics and Gynecology

## 2022-04-30 DIAGNOSIS — N6320 Unspecified lump in the left breast, unspecified quadrant: Secondary | ICD-10-CM

## 2022-05-08 ENCOUNTER — Other Ambulatory Visit: Payer: Self-pay | Admitting: Obstetrics and Gynecology

## 2022-05-08 ENCOUNTER — Ambulatory Visit
Admission: RE | Admit: 2022-05-08 | Discharge: 2022-05-08 | Disposition: A | Discharge status: Home / Self Care | Payer: 59 | Source: Ambulatory Visit | Attending: Obstetrics and Gynecology | Admitting: Obstetrics and Gynecology

## 2022-05-08 DIAGNOSIS — N6320 Unspecified lump in the left breast, unspecified quadrant: Secondary | ICD-10-CM

## 2022-05-08 DIAGNOSIS — R599 Enlarged lymph nodes, unspecified: Secondary | ICD-10-CM

## 2022-05-15 ENCOUNTER — Ambulatory Visit
Admission: RE | Admit: 2022-05-15 | Discharge: 2022-05-15 | Disposition: A | Discharge status: Home / Self Care | Payer: 59 | Source: Ambulatory Visit | Attending: Obstetrics and Gynecology | Admitting: Obstetrics and Gynecology

## 2022-05-15 DIAGNOSIS — R599 Enlarged lymph nodes, unspecified: Secondary | ICD-10-CM

## 2022-05-15 DIAGNOSIS — N6320 Unspecified lump in the left breast, unspecified quadrant: Secondary | ICD-10-CM

## 2022-05-15 HISTORY — PX: BREAST BIOPSY: SHX20

## 2022-05-16 ENCOUNTER — Telehealth: Payer: Self-pay | Admitting: *Deleted

## 2022-05-16 NOTE — Telephone Encounter (Signed)
Spoke with patient to confirm appt for 05/21/12 at 12:15pm and discuss navigation resources. Emailed paperwork per pt. Request.

## 2022-05-19 ENCOUNTER — Other Ambulatory Visit: Payer: Self-pay | Admitting: *Deleted

## 2022-05-19 ENCOUNTER — Encounter: Payer: Self-pay | Admitting: *Deleted

## 2022-05-19 DIAGNOSIS — C50412 Malignant neoplasm of upper-outer quadrant of left female breast: Secondary | ICD-10-CM | POA: Insufficient documentation

## 2022-05-20 NOTE — Progress Notes (Signed)
Radiation Oncology         [phone] ________________________________  Multidisciplinary Breast Oncology Clinic [facility]) Initial Outpatient Consultation  Name: Erika [name] MRN: [id number]  Date: 05/21/2022  DOB: 1960-06-16  CC:Erika Stalker, PA-C  Stark Klein, MD   REFERRING PHYSICIAN: Stark Klein, MD  DIAGNOSIS: There were no encounter diagnoses.  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  HISTORY OF PRESENT ILLNESS::Erika [name] is a 62 y.o. female who is presenting to the office today for evaluation of her newly diagnosed breast cancer. She is accompanied by ***. She is doing well overall.   The patient presented with a palpable left breast mass. This prompted a bilateral diagnostic mammography with tomography and left breast ultrasonography at [facility] [place] on 05/08/22 showing: a 2.9 x 2.2 x 4.2 cm irregular hypoechoic mass in the 1 o'clock left breast, 6 cmfn, corresponding with the palpable area of concern. A focally thickened left axillary lymph node was also appreciated, with a cortex measuring up to 5 mm. Physical exam performed at the time of diagnotic imaging confirmed the presence of a firm palpable mass within the upper-outer left breast.   Biopsy of the 1 o'clock left breast, 1 cmfn, on 05/15/22 showed: grade 3 invasive ductal adenocarcinoma measuring 12 mm in the greatest linear extent of the sample. Biopsy of the abnormal left axillary lymph node showed metastatic ductal adenocarcinoma.  Prognostic indicators significant for: estrogen receptor, 0% negative and progesterone receptor, 0% negative. Proliferation marker Ki67 at 35%. HER2 positive.  Menarche: *** years old Age at first live birth: *** years old GP: *** LMP: *** Contraceptive: *** HRT: ***   The patient was referred today for presentation in the multidisciplinary conference.  Radiology  studies and pathology slides were presented there for review and discussion of treatment options.  A consensus was discussed regarding potential next steps.  PREVIOUS RADIATION THERAPY: {EXAM; YES/NO:19492::"No"}  PAST MEDICAL HISTORY:  Past Medical History:  Diagnosis Date   Colitis 2010   seen on CT (at ER visit)   Hyperplastic colon polyp    Hypertension    Liver hemangioma 2010   seen on CT    PAST SURGICAL HISTORY: Past Surgical History:  Procedure Laterality Date   "freezing of cervix"     BREAST BIOPSY Left 05/15/2022   [place] LT BREAST BX W LOC DEV 1ST LESION IMG BX [place] US GUIDE 05/15/2022 [facility] MAMMOGRAPHY   CESAREAN SECTION      FAMILY HISTORY:  Family History  Problem Relation Age of Onset   Hypertension Father    Heart disease Father    Colon polyps Father    Diabetes Paternal Grandmother    Hypertension Brother    Colon polyps Sister     SOCIAL HISTORY:  Social History   Socioeconomic History   Marital status: Married    Spouse name: Not on file   Number of children: 0   Years of education: Not on file   Highest education level: Not on file  Occupational History   Occupation: Realestate  Tobacco Use   Smoking status: Former   Smokeless tobacco: Never   Tobacco comments:    smoked 1 year while in college  Substance and Sexual Activity   Alcohol use: Yes    Alcohol/week: 14.0 standard drinks of alcohol    Types: 14 Glasses of wine per week    Comment: 2 drinks  per day    Drug use: No   Sexual activity: Not on file  Other Topics Concern   Not on file  Social History Narrative   1 caffeine drinks daily    Social Determinants of Health   Financial Resource Strain: Not on file  Food Insecurity: Not on file  Transportation Needs: Not on file  Physical Activity: Not on file  Stress: Not on file  Social Connections: Not on file    ALLERGIES:  Allergies  Allergen Reactions   Penicillins Nausea Only    MEDICATIONS:  Current Outpatient  Medications  Medication Sig Dispense Refill   albuterol (PROVENTIL HFA;VENTOLIN HFA) 108 (90 BASE) MCG/ACT inhaler Inhale 2 puffs into the lungs every 6 (six) hours as needed.      ALPRAZolam (XANAX) 0.5 MG tablet Take 0.5 mg by mouth 3 (three) times daily as needed.       estradiol-norethindrone (ACTIVELLA) 1-0.5 MG per tablet Take 1 tablet by mouth daily.       hydrocortisone (ANUSOL-HC) 25 MG suppository Place 1 suppository (25 mg total) rectally at bedtime. 12 suppository 1   lisinopril-hydrochlorothiazide (PRINZIDE,ZESTORETIC) 20-25 MG per tablet Take 1 tablet by mouth daily.       loperamide (IMODIUM A-D) 2 MG tablet Take 2 mg by mouth as needed.       No current facility-administered medications for this encounter.    REVIEW OF SYSTEMS: A 10+ POINT REVIEW OF SYSTEMS WAS OBTAINED including neurology, dermatology, psychiatry, cardiac, respiratory, lymph, extremities, GI, GU, musculoskeletal, constitutional, reproductive, HEENT. On the provided form, she reports ***. She denies *** and any other symptoms.    PHYSICAL EXAM:  vitals were not taken for this visit.  {may need to copy over vitals} Lungs are clear to auscultation bilaterally. Heart has regular rate and rhythm. No palpable cervical, supraclavicular, or axillary adenopathy. Abdomen soft, non-tender, normal bowel sounds. Breast: *** breast with no palpable mass, nipple discharge, or bleeding. *** breast with ***.   KPS = ***  100 - Normal; no complaints; no evidence of disease. 90   - Able to carry on normal activity; minor signs or symptoms of disease. 80   - Normal activity with effort; some signs or symptoms of disease. 18   - Cares for self; unable to carry on normal activity or to do active work. 60   - Requires occasional assistance, but is able to care for most of his personal needs. 50   - Requires considerable assistance and frequent medical care. 21   - Disabled; requires special care and assistance. 51   - Severely  disabled; hospital admission is indicated although death not imminent. 37   - Very sick; hospital admission necessary; active supportive treatment necessary. 10   - Moribund; fatal processes progressing rapidly. 0     - Dead  Karnofsky DA, Abelmann [place], Craver LS and Burchenal JH [phone]) The use of the nitrogen mustards in the palliative treatment of carcinoma: with particular reference to bronchogenic carcinoma Cancer 1 [phone]  LABORATORY DATA:  Lab Results  Component Value Date   WBC 13.5 (H) 01/21/2009   HGB 14.0 01/21/2009   HCT 41.0 01/21/2009   MCV 95.1 01/21/2009   PLT 295 01/21/2009   Lab Results  Component Value Date   NA 137 01/21/2009   K 4.5 01/21/2009   CL 101 01/21/2009   CO2 24 01/21/2009   Lab Results  Component Value Date   ALT 23 01/21/2009   AST 32 01/21/2009  ALKPHOS 46 01/21/2009   BILITOT 1.3 (H) 01/21/2009    PULMONARY FUNCTION TEST:   Review Flowsheet        No data to display          RADIOGRAPHY: [place] LT BREAST BX W LOC DEV 1ST LESION IMG BX [place] US GUIDE  Addendum Date: 05/16/2022   ADDENDUM REPORT: 05/16/2022 14:34 ADDENDUM: Pathology revealed: GRADE III INVASIVE POORLY DIFFERENTIATED DUCTAL ADENOCARCINOMA of the LEFT breast, upper outer quadrant at 1 o'clock, 6cmfn (heart clip). This was found to be concordant by Dr. Peggye Fothergill. Pathology revealed: METASTATIC DUCTAL ADENOCARCINOMA, NODAL TISSUE PRESENT of the LEFT axilla, (hydromark clip). This was found to be concordant by Dr. Peggye Fothergill. Pathology results were discussed with the patient by telephone. The patient reported doing well after the biopsies with tenderness at the sites. Post biopsy instructions and care were reviewed and questions were answered. The patient was encouraged to call [facility] [place] for any additional concerns. My direct phone number was provided. The patient was referred to [facility] on May 21, 2022. Pathology results reported by Terie Purser, RN on 05/16/2022. Electronically Signed   By: Evangeline Dakin M.D.   On: 05/16/2022 14:34   Result Date: 05/16/2022 CLINICAL DATA:  62 year old with a palpable highly suspicious 3.2 cm mass UPPER OUTER QUADRANT of the LEFT breast at 1 o'clock 6 cm from nipple and a solitary abnormal LEFT axillary lymph node with eccentric cortical thickening up to at least 0.5 cm. EXAM: ULTRASOUND-GUIDED LEFT BREAST CORE NEEDLE BIOPSY ULTRASOUND-GUIDED LEFT AXILLARY NODE CORE NEEDLE BIOPSY COMPARISON:  Previous exam(s). PROCEDURE: I met with the patient and we discussed the procedure of ultrasound-guided biopsy, including benefits and alternatives. We discussed the high likelihood of a successful procedure. We discussed the risks of the procedure, including infection, bleeding, tissue injury, clip migration, and inadequate sampling. Informed written consent was given. The usual time-out protocol was performed immediately prior to the procedure. #1) LEFT breast, lesion quadrant: UPPER OUTER QUADRANT. Using sterile technique with chlorhexidine as skin antisepsis, 1% Lidocaine as local anesthetic, under direct ultrasound visualization, a 12 gauge Bard Marquee core needle device placed through an 11 gauge introducer needle was used to perform biopsy of the mass in the [place] using a lateral approach. At the conclusion of the procedure, a heart shaped tissue marker clip was deployed into the biopsy cavity. # 2) LEFT axillary lymph node: Using sterile technique with chlorhexidine as skin antisepsis, 1% lidocaine and 1% lidocaine with epinephrine as local anesthetic, under ultrasound visualization, a 14 gauge Bard Marquee core needle device placed through a 13 gauge introducer needle was used perform biopsy of the abnormal lymph node using an inferolateral approach. At the conclusion of the procedure, a HydroMark spiral shaped tissue  marking clip was placed into the biopsy cavity. The patient tolerated the procedures well and were no apparent immediate complications. Follow up 2 view mammogram was performed and dictated separately. IMPRESSION: 1. Ultrasound-guided core needle biopsy of a 3.2 cm mass in the UPPER OUTER QUADRANT of the LEFT breast. 2. Ultrasound-guided core needle biopsy of a solitary abnormal LEFT axillary lymph node. Electronically Signed: By: Evangeline Dakin M.D. On: 05/15/2022 09:14   [place] AXILLARY NODE CORE BIOPSY LEFT  Addendum Date: 05/16/2022   ADDENDUM REPORT: 05/16/2022 14:34 ADDENDUM: Pathology revealed: GRADE III INVASIVE POORLY DIFFERENTIATED DUCTAL ADENOCARCINOMA of the LEFT breast, upper outer quadrant at 1  o'clock, 6cmfn (heart clip). This was found to be concordant by Dr. Peggye Fothergill. Pathology revealed: METASTATIC DUCTAL ADENOCARCINOMA, NODAL TISSUE PRESENT of the LEFT axilla, (hydromark clip). This was found to be concordant by Dr. Peggye Fothergill. Pathology results were discussed with the patient by telephone. The patient reported doing well after the biopsies with tenderness at the sites. Post biopsy instructions and care were reviewed and questions were answered. The patient was encouraged to call [facility] for any additional concerns. My direct phone number was provided. The patient was referred to [facility] on May 21, 2022. Pathology results reported by Terie Purser, RN on 05/16/2022. Electronically Signed   By: Evangeline Dakin M.D.   On: 05/16/2022 14:34   Result Date: 05/16/2022 CLINICAL DATA:  62 year old with a palpable highly suspicious 3.2 cm mass UPPER OUTER QUADRANT of the LEFT breast at 1 o'clock 6 cm from nipple and a solitary abnormal LEFT axillary lymph node with eccentric cortical thickening up to at least 0.5 cm. EXAM: ULTRASOUND-GUIDED LEFT BREAST CORE NEEDLE BIOPSY  ULTRASOUND-GUIDED LEFT AXILLARY NODE CORE NEEDLE BIOPSY COMPARISON:  Previous exam(s). PROCEDURE: I met with the patient and we discussed the procedure of ultrasound-guided biopsy, including benefits and alternatives. We discussed the high likelihood of a successful procedure. We discussed the risks of the procedure, including infection, bleeding, tissue injury, clip migration, and inadequate sampling. Informed written consent was given. The usual time-out protocol was performed immediately prior to the procedure. #1) LEFT breast, lesion quadrant: UPPER OUTER QUADRANT. Using sterile technique with chlorhexidine as skin antisepsis, 1% Lidocaine as local anesthetic, under direct ultrasound visualization, a 12 gauge Bard Marquee core needle device placed through an 11 gauge introducer needle was used to perform biopsy of the mass in the [place] using a lateral approach. At the conclusion of the procedure, a heart shaped tissue marker clip was deployed into the biopsy cavity. # 2) LEFT axillary lymph node: Using sterile technique with chlorhexidine as skin antisepsis, 1% lidocaine and 1% lidocaine with epinephrine as local anesthetic, under ultrasound visualization, a 14 gauge Bard Marquee core needle device placed through a 13 gauge introducer needle was used perform biopsy of the abnormal lymph node using an inferolateral approach. At the conclusion of the procedure, a HydroMark spiral shaped tissue marking clip was placed into the biopsy cavity. The patient tolerated the procedures well and were no apparent immediate complications. Follow up 2 view mammogram was performed and dictated separately. IMPRESSION: 1. Ultrasound-guided core needle biopsy of a 3.2 cm mass in the UPPER OUTER QUADRANT of the LEFT breast. 2. Ultrasound-guided core needle biopsy of a solitary abnormal LEFT axillary lymph node. Electronically Signed: By: Evangeline Dakin M.D. On: 05/15/2022 09:14   MM CLIP PLACEMENT LEFT  Result  Date: 05/15/2022 CLINICAL DATA:  Confirmation of clip placement after ultrasound-guided core needle biopsy of a mass in the UPPER OUTER QUADRANT of the LEFT breast and ultrasound-guided core needle biopsy of an abnormal LEFT axillary lymph node. EXAM: 2D and 3D DIAGNOSTIC LEFT MAMMOGRAM POST ULTRASOUND BIOPSY COMPARISON:  Previous exam(s). FINDINGS: 2D and 3D full field CC and mediolateral mammographic images were obtained following ultrasound guided biopsy of a mass in the UPPER OUTER QUADRANT of the LEFT breast and an abnormal LEFT axillary lymph node. The heart shaped tissue marking clip is appropriately positioned within the biopsied mass in the [place]. [facility] spiral shaped tissue marking  clip is appropriately positioned within the biopsied lymph node in the low axilla. Expected post biopsy changes are present at both sites without evidence of hematoma. IMPRESSION: 1. Appropriate positioning of the heart shaped tissue marking clip within biopsied mass in the [place] of the LEFT breast. 2. Appropriate positioning of the HydroMark spiral shaped tissue marking clip within the biopsied LEFT axillary lymph node. Final Assessment: Post Procedure Mammograms for Marker Placement Electronically Signed   By: Evangeline Dakin M.D.   On: 05/15/2022 09:13  MM DIAG BREAST TOMO BILATERAL  Result Date: 05/08/2022 CLINICAL DATA:  Patient presents for palpable mass within the left breast. EXAM: DIGITAL DIAGNOSTIC BILATERAL MAMMOGRAM WITH TOMOSYNTHESIS; ULTRASOUND LEFT BREAST LIMITED TECHNIQUE: Bilateral digital diagnostic mammography and breast tomosynthesis was performed.; Targeted ultrasound examination of the left breast was performed. COMPARISON:  Previous exam(s). ACR Breast Density Category c: The breast tissue is heterogeneously dense, which may obscure small masses. FINDINGS: Partially obscured irregular mass within the upper-outer left breast at the site of palpable concern. No  additional masses, calcifications or distortion identified within either breast. On physical exam, there is a firm palpable mass within the upper-outer left breast. Targeted ultrasound is performed, showing a 2.9 x 2.2 x 4.2 cm irregular hypoechoic mass left breast 1 o'clock position 6 cm from nipple corresponding with the palpable area of concern. There is a focally thickened left axillary lymph node with a cortex measuring up to 5 mm. IMPRESSION: Suspicious palpable left breast mass 1 o'clock position. Indeterminate focally thickened left axillary lymph node. RECOMMENDATION: Ultrasound-guided core needle biopsy left breast mass 1 o'clock position. Ultrasound-guided core needle biopsy focally thickened left axillary lymph node. I have discussed the findings and recommendations with the patient. If applicable, a reminder letter will be sent to the patient regarding the next appointment. BI-RADS CATEGORY  5: Highly suggestive of malignancy. Electronically Signed   By: Lovey Newcomer M.D.   On: 05/08/2022 11:49  US BREAST LTD UNI LEFT INC AXILLA  Result Date: 05/08/2022 CLINICAL DATA:  Patient presents for palpable mass within the left breast. EXAM: DIGITAL DIAGNOSTIC BILATERAL MAMMOGRAM WITH TOMOSYNTHESIS; ULTRASOUND LEFT BREAST LIMITED TECHNIQUE: Bilateral digital diagnostic mammography and breast tomosynthesis was performed.; Targeted ultrasound examination of the left breast was performed. COMPARISON:  Previous exam(s). ACR Breast Density Category c: The breast tissue is heterogeneously dense, which may obscure small masses. FINDINGS: Partially obscured irregular mass within the upper-outer left breast at the site of palpable concern. No additional masses, calcifications or distortion identified within either breast. On physical exam, there is a firm palpable mass within the upper-outer left breast. Targeted ultrasound is performed, showing a 2.9 x 2.2 x 4.2 cm irregular hypoechoic mass left breast 1 o'clock  position 6 cm from nipple corresponding with the palpable area of concern. There is a focally thickened left axillary lymph node with a cortex measuring up to 5 mm. IMPRESSION: Suspicious palpable left breast mass 1 o'clock position. Indeterminate focally thickened left axillary lymph node. RECOMMENDATION: Ultrasound-guided core needle biopsy left breast mass 1 o'clock position. Ultrasound-guided core needle biopsy focally thickened left axillary lymph node. I have discussed the findings and recommendations with the patient. If applicable, a reminder letter will be sent to the patient regarding the next appointment. BI-RADS CATEGORY  5: Highly suggestive of malignancy. Electronically Signed   By: Lovey Newcomer M.D.   On: 05/08/2022 11:49     IMPRESSION: *** {DIAGNOSIS HERE}  Patient will be a good candidate for breast conservation with  radiotherapy to the left breast. We discussed the general course of radiation, potential side effects, and toxicities with radiation and the patient is interested in this approach. ***   PLAN:  ***   ------------------------------------------------  Blair Promise, PhD, MD  This document serves as a record of services personally performed by Gery Pray, MD. It was created on his behalf by Roney Mans, a trained medical scribe. The creation of this record is based on the scribe's personal observations and the provider's statements to them. This document has been checked and approved by the attending provider.

## 2022-05-21 ENCOUNTER — Ambulatory Visit: Payer: 59 | Attending: General Surgery | Admitting: Physical Therapy

## 2022-05-21 ENCOUNTER — Encounter: Payer: Self-pay | Admitting: Physical Therapy

## 2022-05-21 ENCOUNTER — Encounter: Payer: Self-pay | Admitting: Hematology and Oncology

## 2022-05-21 ENCOUNTER — Inpatient Hospital Stay: Payer: 59

## 2022-05-21 ENCOUNTER — Other Ambulatory Visit: Payer: Self-pay

## 2022-05-21 ENCOUNTER — Inpatient Hospital Stay: Payer: 59 | Attending: Hematology and Oncology | Admitting: Hematology and Oncology

## 2022-05-21 ENCOUNTER — Other Ambulatory Visit: Payer: Self-pay | Admitting: General Surgery

## 2022-05-21 ENCOUNTER — Encounter: Payer: Self-pay | Admitting: General Practice

## 2022-05-21 ENCOUNTER — Encounter: Payer: Self-pay | Admitting: *Deleted

## 2022-05-21 ENCOUNTER — Ambulatory Visit
Admission: RE | Admit: 2022-05-21 | Discharge: 2022-05-21 | Disposition: A | Discharge status: Home / Self Care | Payer: 59 | Source: Ambulatory Visit | Attending: Radiation Oncology | Admitting: Radiation Oncology

## 2022-05-21 ENCOUNTER — Inpatient Hospital Stay (HOSPITAL_BASED_OUTPATIENT_CLINIC_OR_DEPARTMENT_OTHER): Payer: 59 | Admitting: Genetic Counselor

## 2022-05-21 VITALS — BP 141/86 | HR 80 | Temp 97.7°F | Resp 16 | Ht 67.0 in | Wt 183.5 lb

## 2022-05-21 DIAGNOSIS — C50412 Malignant neoplasm of upper-outer quadrant of left female breast: Secondary | ICD-10-CM

## 2022-05-21 DIAGNOSIS — Z171 Estrogen receptor negative status [ER-]: Secondary | ICD-10-CM

## 2022-05-21 DIAGNOSIS — I10 Essential (primary) hypertension: Secondary | ICD-10-CM | POA: Diagnosis not present

## 2022-05-21 DIAGNOSIS — R293 Abnormal posture: Secondary | ICD-10-CM | POA: Diagnosis present

## 2022-05-21 DIAGNOSIS — Z7982 Long term (current) use of aspirin: Secondary | ICD-10-CM | POA: Diagnosis not present

## 2022-05-21 DIAGNOSIS — Z8051 Family history of malignant neoplasm of kidney: Secondary | ICD-10-CM

## 2022-05-21 DIAGNOSIS — Z5111 Encounter for antineoplastic chemotherapy: Secondary | ICD-10-CM | POA: Diagnosis present

## 2022-05-21 DIAGNOSIS — Z87891 Personal history of nicotine dependence: Secondary | ICD-10-CM | POA: Diagnosis not present

## 2022-05-21 DIAGNOSIS — Z5112 Encounter for antineoplastic immunotherapy: Secondary | ICD-10-CM | POA: Insufficient documentation

## 2022-05-21 DIAGNOSIS — Z79899 Other long term (current) drug therapy: Secondary | ICD-10-CM | POA: Diagnosis not present

## 2022-05-21 LAB — CBC WITH DIFFERENTIAL (CANCER CENTER ONLY)
Abs Immature Granulocytes: 0.02 10*3/uL (ref 0.00–0.07)
Basophils Absolute: 0 10*3/uL (ref 0.0–0.1)
Basophils Relative: 0 %
Eosinophils Absolute: 0.2 10*3/uL (ref 0.0–0.5)
Eosinophils Relative: 3 %
HCT: 37.6 % (ref 36.0–46.0)
Hemoglobin: 12.6 g/dL (ref 12.0–15.0)
Immature Granulocytes: 0 %
Lymphocytes Relative: 34 %
Lymphs Abs: 2 10*3/uL (ref 0.7–4.0)
MCH: 32 pg (ref 26.0–34.0)
MCHC: 33.5 g/dL (ref 30.0–36.0)
MCV: 95.4 fL (ref 80.0–100.0)
Monocytes Absolute: 0.3 10*3/uL (ref 0.1–1.0)
Monocytes Relative: 5 %
Neutro Abs: 3.3 10*3/uL (ref 1.7–7.7)
Neutrophils Relative %: 58 %
Platelet Count: 227 10*3/uL (ref 150–400)
RBC: 3.94 MIL/uL (ref 3.87–5.11)
RDW: 13.1 % (ref 11.5–15.5)
WBC Count: 5.8 10*3/uL (ref 4.0–10.5)
nRBC: 0 % (ref 0.0–0.2)

## 2022-05-21 LAB — CMP (CANCER CENTER ONLY)
ALT: 27 U/L (ref 0–44)
AST: 30 U/L (ref 15–41)
Albumin: 4.2 g/dL (ref 3.5–5.0)
Alkaline Phosphatase: 75 U/L (ref 38–126)
Anion gap: 6 (ref 5–15)
BUN: 11 mg/dL (ref 6–20)
CO2: 27 mmol/L (ref 22–32)
Calcium: 9.1 mg/dL (ref 8.9–10.3)
Chloride: 105 mmol/L (ref 98–111)
Creatinine: 0.76 mg/dL (ref 0.44–1.00)
GFR, Estimated: >60 mL/min (ref >60)
Glucose, Bld: 124 mg/dL — ABNORMAL HIGH (ref 70–99)
Potassium: 4.1 mmol/L (ref 3.5–5.1)
Sodium: 138 mmol/L (ref 135–145)
Total Bilirubin: 0.4 mg/dL (ref 0.3–1.2)
Total Protein: 6.7 g/dL (ref 6.5–8.1)

## 2022-05-21 LAB — GENETIC SCREENING ORDER

## 2022-05-21 MED ORDER — PROCHLORPERAZINE MALEATE 10 MG PO TABS: 10.0000 mg | ORAL_TABLET | Freq: Four times a day (QID) | ORAL | 1 refills | Status: DC | PRN

## 2022-05-21 MED ORDER — ONDANSETRON HCL 8 MG PO TABS: 8.0000 mg | ORAL_TABLET | Freq: Three times a day (TID) | ORAL | 1 refills | Status: DC | PRN

## 2022-05-21 MED ORDER — LIDOCAINE-PRILOCAINE 2.5-2.5 % EX CREA: TOPICAL_CREAM | CUTANEOUS | 3 refills | Status: DC

## 2022-05-21 MED ORDER — DEXAMETHASONE 4 MG PO TABS: 4.0000 mg | ORAL_TABLET | Freq: Every day | ORAL | 0 refills | Status: DC

## 2022-05-21 NOTE — Progress Notes (Signed)
START ON PATHWAY REGIMEN - Breast     Cycle 1: A cycle is 21 days:     Pertuzumab      Trastuzumab-xxxx      Docetaxel      Carboplatin    Cycles 2 through 6: A cycle is every 21 days:     Pertuzumab      Trastuzumab-xxxx      Docetaxel      Carboplatin   **Always confirm dose/schedule in your pharmacy ordering system**  Patient Characteristics: Preoperative or Nonsurgical Candidate (Clinical Staging), Neoadjuvant Therapy followed by Surgery, Invasive Disease, Chemotherapy, HER2 Positive, ER Negative Therapeutic Status: Preoperative or Nonsurgical Candidate (Clinical Staging) AJCC M Category: cM0 AJCC Grade: G3 Breast Surgical Plan: Neoadjuvant Therapy followed by Surgery ER Status: Negative (-) AJCC 8 Stage Grouping: IIB HER2 Status: Positive (+) AJCC T Category: cT2 AJCC N Category: cN1 PR Status: Negative (-) Intent of Therapy: Curative Intent, Discussed with Patient

## 2022-05-21 NOTE — Therapy (Signed)
OUTPATIENT PHYSICAL THERAPY BREAST CANCER BASELINE EVALUATION   Patient Name: Erika [name] MRN: [id number] DOB:1960/05/07, 62 y.o., female Today's Date: 05/21/2022  END OF SESSION:  PT End of Session - 05/21/22 1341     Visit Number 1    Number of Visits 2    Date for PT Re-Evaluation 11/19/22    PT Start Time [id number]    PT Stop Time 1330   Also saw pt from 1410 to 1420 for a total of 34 minutes   PT Time Calculation (min) 24 min    Activity Tolerance Patient tolerated treatment well    Behavior During Therapy [facility] for tasks assessed/performed             Past Medical History:  Diagnosis Date   Colitis 2010   seen on CT (at ER visit)   Hyperplastic colon polyp    Hypertension    Liver hemangioma 2010   seen on CT   Past Surgical History:  Procedure Laterality Date   "freezing of cervix"     BREAST BIOPSY Left 05/15/2022   [place] LT BREAST BX W LOC DEV 1ST LESION IMG BX [place] US GUIDE 05/15/2022 [facility] MAMMOGRAPHY   CESAREAN SECTION     Patient Active Problem List   Diagnosis Date Noted   Malignant neoplasm of upper-outer quadrant of left breast in female, estrogen receptor negative ([place]) 05/19/2022    REFERRING PROVIDER: Dr. Stark Klein  REFERRING DIAG: Left breast cancer  THERAPY DIAG:  Malignant neoplasm of upper-outer quadrant of left breast in female, estrogen receptor negative ([place])  Abnormal posture  Rationale for Evaluation and Treatment: Rehabilitation  ONSET DATE: 05/08/2022  SUBJECTIVE:                                                                                                                                                                                           SUBJECTIVE STATEMENT: Patient reports she is here today to be seen by her medical team for her newly diagnosed left breast cancer.   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 It measures 4.2 cm and is located in the  upper outer quadrant. It is ER/PR negative and HER2 positive with a Ki67 of 35%.   PATIENT GOALS:   reduce lymphedema risk and learn post op HEP.   PAIN:  Are you having pain? No  PRECAUTIONS: Active CA   HAND DOMINANCE: right  WEIGHT BEARING RESTRICTIONS: No  FALLS:  Has patient fallen in last 6 months? No  LIVING ENVIRONMENT: Patient lives with: her husband Lives in: House/apartment Has following equipment  at home: None  OCCUPATION: Realtor and Secondary school teacher  LEISURE: She does not exercise  PRIOR LEVEL OF FUNCTION: Independent   OBJECTIVE:  COGNITION: Overall cognitive status: Within functional limits for tasks assessed    POSTURE:  Forward head and rounded shoulders posture  UPPER EXTREMITY AROM/PROM:  A/PROM RIGHT   eval   Shoulder extension 58  Shoulder flexion 157  Shoulder abduction 162  Shoulder internal rotation 72  Shoulder external rotation 90    (Blank rows = not tested)  A/PROM LEFT   eval  Shoulder extension 66  Shoulder flexion 140  Shoulder abduction 163  Shoulder internal rotation 67  Shoulder external rotation 82    (Blank rows = not tested)  CERVICAL AROM: All within normal limits  UPPER EXTREMITY STRENGTH: WFL  LYMPHEDEMA ASSESSMENTS:   LANDMARK RIGHT   eval  10 cm proximal to olecranon process 28.9  Olecranon process 24.8  10 cm proximal to ulnar styloid process 20.4  Just proximal to ulnar styloid process 15.4  Across hand at thumb web space 18.3  At base of 2nd digit 5.8  (Blank rows = not tested)  LANDMARK LEFT   eval  10 cm proximal to olecranon process 28.3  Olecranon process 25  10 cm proximal to ulnar styloid process 19.4  Just proximal to ulnar styloid process 15.2  Across hand at thumb web space 18.2  At base of 2nd digit 5.8  (Blank rows = not tested)  L-DEX LYMPHEDEMA SCREENING:  The patient was assessed using the L-Dex machine today to produce a lymphedema index baseline score. The patient will  be reassessed on a regular basis (typically every 3 months) to obtain new L-Dex scores. If the score is > 6.5 points away from his/her baseline score indicating onset of subclinical lymphedema, it will be recommended to wear a compression garment for 4 weeks, 12 hours per day and then be reassessed. If the score continues to be > 6.5 points from baseline at reassessment, we will initiate lymphedema treatment. Assessing in this manner has a 95% rate of preventing clinically significant lymphedema.   L-DEX FLOWSHEETS - 05/21/22 1300       L-DEX LYMPHEDEMA SCREENING   Measurement Type Unilateral    L-DEX MEASUREMENT EXTREMITY Upper Extremity    POSITION  Standing    DOMINANT SIDE Right    At Risk Side Left    BASELINE SCORE (UNILATERAL) 1.2             QUICK DASH SURVEY:  Katina Dung - 05/21/22 0001     Open a tight or new jar No difficulty    Do heavy household chores (wash walls, wash floors) No difficulty    Carry a shopping bag or briefcase No difficulty    Wash your back No difficulty    Use a knife to cut food No difficulty    Recreational activities in which you take some force or impact through your arm, shoulder, or hand (golf, hammering, tennis) No difficulty    During the past week, to what extent has your arm, shoulder or hand problem interfered with your normal social activities with family, friends, neighbors, or groups? Not at all    During the past week, to what extent has your arm, shoulder or hand problem limited your work or other regular daily activities Not at all    Arm, shoulder, or hand pain. None    Tingling (pins and needles) in your arm, shoulder, or hand None  Difficulty Sleeping No difficulty    DASH Score 0 %              PATIENT EDUCATION:  Education details: Lymphedema risk reduction and post op shoulder/posture HEP Person educated: Patient Education method: Explanation, Demonstration, Handout Education comprehension: Patient verbalized  understanding and returned demonstration  HOME EXERCISE PROGRAM: Patient was instructed today in a home exercise program today for post op shoulder range of motion. These included active assist shoulder flexion in sitting, scapular retraction, wall walking with shoulder abduction, and hands behind head external rotation.  She was encouraged to do these twice a day, holding 3 seconds and repeating 5 times when permitted by her physician.   ASSESSMENT:  CLINICAL IMPRESSION: Patient was diagnosed on 05/08/2022 with left grade 3 invasive ductal carcinoma breast cancer. It measures 4.2 cm and is located in the upper outer quadrant. It is ER/PR negative and HER2 positive with a Ki67 of 35%. Her multidisciplinary medical team met prior to her assessments to determine a recommended treatment plan. She is planning to have neoadjuvant chemotherapy followed by a left lumpectomy and targeted node dissection, radiation, and anti-estrogen therapy. She will benefit from a post op PT reassessment to determine needs and from L-Dex screens every 3 months for 2 years to detect subclinical lymphedema.  Pt will benefit from skilled therapeutic intervention to improve on the following deficits: Decreased knowledge of precautions, impaired UE functional use, pain, decreased ROM, postural dysfunction.   PT treatment/interventions: ADL/self-care home management, pt/family education, therapeutic exercise  REHAB POTENTIAL: Excellent  CLINICAL DECISION MAKING: Stable/uncomplicated  EVALUATION COMPLEXITY: Low   GOALS: Goals reviewed with patient? YES  LONG TERM GOALS: (STG=LTG)    Name Target Date Goal status  1 Pt will be able to verbalize understanding of pertinent lymphedema risk reduction practices relevant to her dx specifically related to skin care.  Baseline:  No knowledge 05/21/2022 Achieved at eval  2 Pt will be able to return demo and/or verbalize understanding of the post op HEP related to regaining  shoulder ROM. Baseline:  No knowledge 05/21/2022 Achieved at eval  3 Pt will be able to verbalize understanding of the importance of attending the post op After Breast CA Class for further lymphedema risk reduction education and therapeutic exercise.  Baseline:  No knowledge 05/21/2022 Achieved at eval  4 Pt will demo she has regained full shoulder ROM and function post operatively compared to baselines.  Baseline: See objective measurements taken today. 11/19/2022     PLAN:  PT FREQUENCY/DURATION: EVAL and 1 follow up appointment.   PLAN FOR NEXT SESSION: will reassess 3-4 weeks post op to determine needs.   Patient will follow up at outpatient cancer rehab 3-4 weeks following surgery.  If the patient requires physical therapy at that time, a specific plan will be dictated and sent to the referring physician for approval. The patient was educated today on appropriate basic range of motion exercises to begin post operatively and the importance of attending the After Breast Cancer class following surgery.  Patient was educated today on lymphedema risk reduction practices as it pertains to recommendations that will benefit the patient immediately following surgery.  She verbalized good understanding.    Physical Therapy Information for After Breast Cancer Surgery/Treatment:  Lymphedema is a swelling condition that you may be at risk for in your arm if you have lymph nodes removed from the armpit area.  After a sentinel node biopsy, the risk is approximately 5-9% and is higher after an  axillary node dissection.  There is treatment available for this condition and it is not life-threatening.  Contact your physician or physical therapist with concerns. You may begin the 4 shoulder/posture exercises (see additional sheet) when permitted by your physician (typically a week after surgery).  If you have drains, you may need to wait until those are removed before beginning range of motion exercises.  A  general recommendation is to not lift your arms above shoulder height until drains are removed.  These exercises should be done to your tolerance and gently.  This is not a "no pain/no gain" type of recovery so listen to your body and stretch into the range of motion that you can tolerate, stopping if you have pain.  If you are having immediate reconstruction, ask your plastic surgeon about doing exercises as he or she may want you to wait. We encourage you to attend the free one time ABC (After Breast Cancer) class offered by [facility].  You will learn information related to lymphedema risk, prevention and treatment and additional exercises to regain mobility following surgery.  You can call [phone] for more information.  This is offered the 1st and 3rd Monday of each month.  You only attend the class one time. While undergoing any medical procedure or treatment, try to avoid blood pressure being taken or needle sticks from occurring on the arm on the side of cancer.   This recommendation begins after surgery and continues for the rest of your life.  This may help reduce your risk of getting lymphedema (swelling in your arm). An excellent resource for those seeking information on lymphedema is the National Lymphedema Network's web site. It can be accessed at [place].org If you notice swelling in your hand, arm or breast at any time following surgery (even if it is many years from now), please contact your doctor or physical therapist to discuss this.  Lymphedema can be treated at any time but it is easier for you if it is treated early on.  If you feel like your shoulder motion is not returning to normal in a reasonable amount of time, please contact your surgeon or physical therapist.  [facility] [phone]. [address], [place] [address]  ABC CLASS After Breast Cancer Class  After Breast Cancer Class is a specially  designed exercise class to assist you in a safe recover after having breast cancer surgery.  In this class you will learn how to get back to full function whether your drains were just removed or if you had surgery a month ago.  This one-time class is held the 1st and 3rd Monday of every month from 11:00 a.m. until 12:00 noon virtually.  This class is FREE and space is limited. For more information or to register for the next available class, call [phone].  Class Goals  Understand specific stretches to improve the flexibility of you chest and shoulder. Learn ways to safely strengthen your upper body and improve your posture. Understand the warning signs of infection and why you may be at risk for an arm infection. Learn about Lymphedema and prevention.  ** You do not attend this class until after surgery.  Drains must be removed to participate  Patient was instructed today in a home exercise program today for post op shoulder range of motion. These included active assist shoulder flexion in sitting, scapular retraction, wall walking with shoulder abduction, and hands behind head external  rotation.  She was encouraged to do these twice a day, holding 3 seconds and repeating 5 times when permitted by her physician.  Annia Friend[place], [place] 05/21/22 2:24 PM

## 2022-05-21 NOTE — Progress Notes (Signed)
[place] Psychosocial Distress Screening Spiritual Care  Met with Erika [name], her husband Erika [name], and her sister Erika [name] in [facility] to introduce [place] team/resources, reviewing distress screen per protocol.  The patient scored a 10 on the Psychosocial Distress Thermometer which indicates severe distress. Also assessed for distress and other psychosocial needs.      05/21/2022    4:33 PM  ONCBCN DISTRESS SCREENING  Screening Type Initial Screening  Distress experienced in past week (1-10) 10  Emotional problem type Nervousness/Anxiety;Adjusting to illness  Spiritual/Religous concerns type Facing my mortality  Information Concerns Type Lack of info about treatment  Physical Problem type Sleep/insomnia  Referral to support programs Yes    Chaplain and patient discussed common feelings and emotions when being diagnosed with cancer, and the importance of support during treatment.  Chaplain informed patient of the support team and support services at [facility].  Chaplain provided contact information and encouraged patient to call with any questions or concerns.  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 We talked in detail about common coping strategies regarding how to talk with family about a new diagnosis and how to manage ongoing updates (such as to try [facility] [place], to choose a spokesperson, and to set boundaries, such as saying, "I'm a little overwhelmed by phone calls right now, but I love receiving cards because they really boost my spirits.").  Follow up needed: Yes.  Placed [facility] peer mentor referral and added to mailing list for programming information, per Erika [name]'s request. Plan to follow up by phone in ca two weeks. Erika [name] also has direct Spiritual Care number and knows to phone whenever needed/desired.   [place], [place], [facility] Pager [phone] Voicemail [phone]

## 2022-05-21 NOTE — Progress Notes (Signed)
[place] NOTE  Patient Care Team: Marda Stalker, PA-C as PCP - General (Family Medicine) Stark Klein, MD as Consulting Physician (General Surgery) Nicholas Lose, MD as Consulting Physician (Hematology and Oncology) Gery Pray, MD as Consulting Physician (Radiation Oncology) Rockwell Germany, RN as Oncology Nurse Navigator Mauro Kaufmann, RN as Oncology Nurse Navigator  CHIEF COMPLAINTS/PURPOSE OF CONSULTATION:  Newly diagnosed breast cancer  HISTORY OF PRESENTING ILLNESS:  Erika [name] 62 y.o. female is here because of recent diagnosis of Left Breast cancer.  Patient felt a lump in the lump breast which was evaluated by mammograms and ultrasound which revealed 4.2 cm mass which on biopsy is grade 3 IDC that was ER PR Neg and Her 2 Positive. The patient was presented to the multidisciplinary tumor board and she is here today accompanied by her family to discuss treatment plan.  I reviewed her records extensively and collaborated the history with the patient.  SUMMARY OF ONCOLOGIC HISTORY: Oncology History  Malignant neoplasm of upper-outer quadrant of left breast in female, estrogen receptor negative ([place])  05/15/2022 Initial Diagnosis   Palpable mass in the left breast at 1 o'clock position measured 4.2 cm ultrasound-guided biopsy revealed grade 3 IDC ER/PR negative HER2 positive Ki-67 35%, left axillary lymph node: Biopsy positive   05/21/2022 Cancer Staging   Staging form: Breast, AJCC 8th Edition - Clinical stage from 05/21/2022: Stage IIB (cT2, cN1, cM0, G3, ER-, PR-, HER2+) - Signed by Nicholas Lose, MD on 05/21/2022 Stage prefix: Initial diagnosis Histologic grading system: 3 grade system      MEDICAL HISTORY:  Past Medical History:  Diagnosis Date   Colitis 2010   seen on CT (at ER visit)   Hyperplastic colon polyp    Hypertension    Liver hemangioma 2010   seen on CT    SURGICAL HISTORY: Past Surgical History:  Procedure  Laterality Date   "freezing of cervix"     BREAST BIOPSY Left 05/15/2022   [place] LT BREAST BX W LOC DEV 1ST LESION IMG BX [place] US GUIDE 05/15/2022 [facility] MAMMOGRAPHY   CESAREAN SECTION      SOCIAL HISTORY: Social History   Socioeconomic History   Marital status: Married    Spouse name: Not on file   Number of children: 0   Years of education: Not on file   Highest education level: Not on file  Occupational History   Occupation: Realestate  Tobacco Use   Smoking status: Former   Smokeless tobacco: Never   Tobacco comments:    smoked 1 year while in college  Substance and Sexual Activity   Alcohol use: Yes    Alcohol/week: 14.0 standard drinks of alcohol    Types: 14 Glasses of wine per week    Comment: 2 drinks per day    Drug use: No   Sexual activity: Not on file  Other Topics Concern   Not on file  Social History Narrative   1 caffeine drinks daily    Social Determinants of Health   Financial Resource Strain: Not on file  Food Insecurity: Not on file  Transportation Needs: Not on file  Physical Activity: Not on file  Stress: Not on file  Social Connections: Not on file  Intimate Partner Violence: Not on file    FAMILY HISTORY: Family History  Problem Relation Age of Onset   Cancer Father    Hypertension Father    Heart disease Father    Colon polyps Father  Lung cancer Father    Colon polyps Sister    Hypertension Brother    Diabetes Paternal Grandmother     ALLERGIES:  is allergic to penicillins.  MEDICATIONS:  Current Outpatient Medications  Medication Sig Dispense Refill   aspirin EC 81 MG tablet 1 tablet Orally Once a day     lisinopril-hydrochlorothiazide (PRINZIDE,ZESTORETIC) 20-25 MG per tablet Take 1 tablet by mouth daily.       rosuvastatin (CRESTOR) 20 MG tablet Take 20 mg by mouth daily.     loperamide (IMODIUM A-D) 2 MG tablet Take 2 mg by mouth as needed.   (Patient not taking: Reported on 05/21/2022)     No current facility-administered  medications for this visit.    REVIEW OF SYSTEMS:   Constitutional: Denies fevers, chills or abnormal night sweats Breast: Palpable lump in the left breast All other systems were reviewed with the patient and are negative.  PHYSICAL EXAMINATION: ECOG PERFORMANCE STATUS: 1 - Symptomatic but completely ambulatory  Vitals:   05/21/22 1305  BP: (!) 141/86  Pulse: 80  Resp: 16  Temp: 97.7 F (36.5 C)  SpO2: 98%   Filed Weights   05/21/22 1305  Weight: 183 lb 8 oz (83.2 kg)    GENERAL:alert, no distress and comfortable BREAST: Large palpable lump in the left breast with bruising from recent biopsies and a palpable axillary lymph node.  LABORATORY DATA:  I have reviewed the data as listed Lab Results  Component Value Date   WBC 5.8 05/21/2022   HGB 12.6 05/21/2022   HCT 37.6 05/21/2022   MCV 95.4 05/21/2022   PLT 227 05/21/2022   Lab Results  Component Value Date   NA 138 05/21/2022   K 4.1 05/21/2022   CL 105 05/21/2022   CO2 27 05/21/2022    RADIOGRAPHIC STUDIES: I have personally reviewed the radiological reports and agreed with the findings in the report.  ASSESSMENT AND PLAN:  Malignant neoplasm of upper-outer quadrant of left breast in female, estrogen receptor negative ([place]) 05/15/2022:Palpable mass in the left breast at 1 o'clock position measured 4.2 cm ultrasound-guided biopsy revealed grade 3 IDC ER/PR negative HER2 positive Ki-67 35%, left axillary lymph node: Biopsy positive  Pathology and radiology counseling: Discussed with the patient, the details of pathology including the type of breast cancer,the clinical staging, the significance of ER, PR and HER-2/neu receptors and the implications for treatment. After reviewing the pathology in detail, we proceeded to discuss the different treatment options between surgery, radiation, chemotherapy, antiestrogen therapies.  Recommendation based on multidisciplinary tumor board: 1. Neoadjuvant chemotherapy with TCH  Perjeta 6 cycles followed by Herceptin Perjeta maintenance versus Kadcyla maintenance (based on response to neoadjuvant chemo) for 1 year 2. Followed by breast conserving surgery if possible with sentinel lymph node study 3. Followed by adjuvant radiation therapy  Chemotherapy Counseling: I discussed the risks and benefits of chemotherapy including the risks of nausea/ vomiting, risk of infection from low WBC count, fatigue due to chemo or anemia, bruising or bleeding due to low platelets, mouth sores, loss/ change in taste and decreased appetite. Liver and kidney function will be monitored through out chemotherapy as abnormalities in liver and kidney function may be a side effect of treatment. Cardiac dysfunction due to Herceptin and Perjeta and neuropathy risk from Taxotere were discussed in detail. Risk of permanent bone marrow dysfunction due to chemo were also discussed.  Plan: 1. Port placement 2. Echocardiogram 3. Chemotherapy class 4. Breast MRI  Return to clinic in  2 weeks to start chemotherapy.   All questions were answered. The patient knows to call the clinic with any problems, questions or concerns.    Harriette Ohara, MD 05/21/22

## 2022-05-21 NOTE — Assessment & Plan Note (Signed)
05/15/2022:Palpable mass in the left breast at 1 o'clock position measured 4.2 cm ultrasound-guided biopsy revealed grade 3 IDC ER/PR negative HER2 positive Ki-67 35%, left axillary lymph node: Biopsy positive  Pathology and radiology counseling: Discussed with the patient, the details of pathology including the type of breast cancer,the clinical staging, the significance of ER, PR and HER-2/neu receptors and the implications for treatment. After reviewing the pathology in detail, we proceeded to discuss the different treatment options between surgery, radiation, chemotherapy, antiestrogen therapies.  Recommendation based on multidisciplinary tumor board: 1. Neoadjuvant chemotherapy with TCH Perjeta 6 cycles followed by Herceptin Perjeta maintenance versus Kadcyla maintenance (based on response to neoadjuvant chemo) for 1 year 2. Followed by breast conserving surgery if possible with sentinel lymph node study 3. Followed by adjuvant radiation therapy  Chemotherapy Counseling: I discussed the risks and benefits of chemotherapy including the risks of nausea/ vomiting, risk of infection from low WBC count, fatigue due to chemo or anemia, bruising or bleeding due to low platelets, mouth sores, loss/ change in taste and decreased appetite. Liver and kidney function will be monitored through out chemotherapy as abnormalities in liver and kidney function may be a side effect of treatment. Cardiac dysfunction due to Herceptin and Perjeta and neuropathy risk from Taxotere were discussed in detail. Risk of permanent bone marrow dysfunction due to chemo were also discussed.  Plan: 1. Port placement 2. Echocardiogram 3. Chemotherapy class 4. Breast MRI  Return to clinic in 2 weeks to start chemotherapy.

## 2022-05-21 NOTE — Research (Signed)
[employer] 2021-05 - Specimen Collection Study to Evaluate Biomarkers in Subjects with Cancer    Patient Erika [name] was identified by Dr. Lindi Adie as a potential candidate for the above listed study.  This Clinical Research Coordinator met with Erika [name], [id number], on 05/21/22 in a manner and location that ensures patient privacy to discuss participation in the above listed research study.  Patient is Accompanied by her family .  A copy of the informed consent document with embedded HIPAA language was provided to the patient.  Patient reads, speaks, and understands [place].   Patient was provided with the business card of this Coordinator and encouraged to contact the research team with any questions.  Approximately 15 minutes were spent with the patient reviewing the informed consent documents.  Patient was provided the option of taking informed consent documents home to review and was encouraged to review at their convenience with their support network, including other care providers. Patient took the consent documents home to review.   Will follow up with them on Tuesday, 05/27/2022 to confirm interest.   Zane Carthern, [facility] 05/21/2022 4:00 PM

## 2022-05-22 ENCOUNTER — Other Ambulatory Visit: Payer: Self-pay

## 2022-05-22 ENCOUNTER — Encounter: Payer: Self-pay | Admitting: Hematology and Oncology

## 2022-05-22 ENCOUNTER — Encounter: Payer: Self-pay | Admitting: Genetic Counselor

## 2022-05-22 ENCOUNTER — Ambulatory Visit (HOSPITAL_COMMUNITY)
Admission: RE | Admit: 2022-05-22 | Discharge: 2022-05-22 | Disposition: A | Discharge status: Home / Self Care | Payer: 59 | Source: Ambulatory Visit | Attending: Hematology and Oncology | Admitting: Hematology and Oncology

## 2022-05-22 DIAGNOSIS — Z171 Estrogen receptor negative status [ER-]: Secondary | ICD-10-CM | POA: Diagnosis not present

## 2022-05-22 DIAGNOSIS — C50412 Malignant neoplasm of upper-outer quadrant of left female breast: Secondary | ICD-10-CM | POA: Insufficient documentation

## 2022-05-22 NOTE — Progress Notes (Signed)
REFERRING PROVIDER: Nicholas Lose, MD [address] [place],  [place] [address]  PRIMARY PROVIDER:  Marda Stalker, PA-C  PRIMARY REASON FOR VISIT:  1. Malignant neoplasm of upper-outer quadrant of left breast in female, estrogen receptor negative ([place])   2. Family history of kidney cancer     HISTORY OF PRESENT ILLNESS:   Erika [name], a 62 y.o. female, was seen for a [place] cancer genetics consultation at the request of Dr. Lindi Adie due to a personal history of breast cancer.  Erika [name] presents to clinic today to discuss the possibility of a hereditary predisposition to cancer, to discuss genetic [name], and to further clarify her future cancer risks, as well as potential cancer risks for family members.   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 The preliminary treatment plan includes neoadjuvant chemotherapy.    CANCER HISTORY:  Oncology History  Malignant neoplasm of upper-outer quadrant of left breast in female, estrogen receptor negative ([place])  05/15/2022 Initial Diagnosis   Palpable mass in the left breast at 1 o'clock position measured 4.2 cm ultrasound-guided biopsy revealed grade 3 IDC ER/PR negative HER2 positive Ki-67 35%, left axillary lymph node: Biopsy positive   05/21/2022 Cancer Staging   Staging form: Breast, AJCC 8th Edition - Clinical stage from 05/21/2022: Stage IIB (cT2, cN1, cM0, G3, ER-, PR-, HER2+) - Signed by Nicholas Lose, MD on 05/21/2022 Stage prefix: Initial diagnosis Histologic grading system: 3 grade system   06/04/2022 -  Chemotherapy   Patient is on Treatment Plan : BREAST  Docetaxel + Carboplatin + Trastuzumab + Pertuzumab  (TCHP) q21d        Past Medical History:  Diagnosis Date   Colitis 2010   seen on CT (at ER visit)   Hyperplastic colon polyp    Hypertension    Liver hemangioma 2010   seen on CT    Past Surgical History:  Procedure  Laterality Date   "freezing of cervix"     BREAST BIOPSY Left 05/15/2022   [place] LT BREAST BX W LOC DEV 1ST LESION IMG BX [place] US GUIDE 05/15/2022 [facility] MAMMOGRAPHY   CESAREAN SECTION      FAMILY HISTORY:  We obtained a detailed, 4-generation family history.  Significant diagnoses are listed below: Family History  Problem Relation Age of Onset   Kidney cancer Mother        dx after 67; early stage   Colon polyps Father    Lung cancer Father 48       smoking hx   Colon polyps Sister    Cancer Maternal Uncle        ? blood cancer; dx after 78   Cancer Maternal Grandmother        ? gallbladder ? colon; d. after 24     Erika [name] is unaware of previous family history of genetic [name] for hereditary cancer risks.  There is no reported Ashkenazi Jewish ancestry. There is no known consanguinity.  GENETIC COUNSELING ASSESSMENT: Erika [name] is a 62 y.o. female with a personal history of breast cancer which is somewhat suggestive of a hereditary cancer syndrome and predisposition to cancer given her age of diagnosis. We, therefore, discussed and recommended the following at today's visit.   DISCUSSION: We discussed that 5 - 10% of cancer is hereditary.  Most cases of hereditary breast cancer are associated with mutations in BRCA1/2.  There are other genes that can be  associated with hereditary breast cancer syndromes.  We discussed that [name] is beneficial for several reasons including knowing how to follow individuals for their cancer risks, identifying whether potential treatment/surgery options would be beneficial, and understanding if other family members could be at risk for cancer and allowing them to undergo genetic [name].   We reviewed the characteristics, features and inheritance patterns of hereditary cancer syndromes. We also discussed genetic [name], including the appropriate family members to test, the process of [name], insurance coverage and turn-around-time for results. We  discussed the implications of a negative, positive, carrier and/or variant of uncertain significant result.  We also discussed that while Erika [name] does not meet criteria for [name] based on available information, it is reasonable for her to consider [name] based on her age of diagnosis alone.   Erika [name]. We recommended Erika [name] pursue genetic [name] for a panel that includes genes associated with breast, kidney, and other cancers.   The Multi-Cancer + RNA Panel offered by [facility] includes sequencing and/or deletion/duplication analysis of the following 70 genes:  AIP*, ALK, APC*, ATM*, AXIN2*, BAP1*, BARD1*, BLM*, BMPR1A*, BRCA1*, BRCA2*, BRIP1*, CDC73*, CDH1*, CDK4, CDKN1B*, CDKN2A, CHEK2*, CTNNA1*, DICER1*, EPCAM (del/dup only), EGFR, FH*, FLCN*, GREM1 (promoter dup only), HOXB13, KIT, LZTR1, MAX*, MBD4, MEN1*, MET, MITF, MLH1*, MSH2*, MSH3*, MSH6*, MUTYH*, NF1*, NF2*, NTHL1*, PALB2*, PDGFRA, PMS2*, POLD1*, POLE*, POT1*, PRKAR1A*, PTCH1*, PTEN*, RAD51C*, RAD51D*, RB1*, RET, SDHA* (sequencing only), SDHAF2*, SDHB*, SDHC*, SDHD*, SMAD4*, SMARCA4*, SMARCB1*, SMARCE1*, STK11*, SUFU*, TMEM127*, TP53*, TSC1*, TSC2*, VHL*. RNA analysis is performed for * genes.  We discussed that if her out of pocket cost for [name] is over $100, the laboratory should contact her and discuss the self-pay prices and/or patient pay assistance programs.    PLAN: After considering the risks, benefits, and limitations, Erika [name] provided informed consent to pursue genetic [name] and the blood sample was sent to [facility] for analysis of the Multi-Cancer +RNA Panel. Results should be available within approximately 3 weeks' time, at which point they will be disclosed by telephone to Erika [name], as will any additional recommendations warranted by these results. Erika [name] will receive a summary of her genetic counseling visit and a copy of her results once available. This information  will also be available in Epic.   Erika [name] were answered to her satisfaction today. Our contact information was provided should additional [name] or concerns arise. Thank you for the referral and allowing [place] to share in the care of your patient.   Erika [name], [place], [facility] Genetic Counselor [email or web address] (P) [phone]  The patient was seen for a total of 15 minutes in face-to-face genetic counseling.  She was accompanied by her husband and sister.  Drs. Lindi Adie and/or Burr Medico were available to discuss this case as needed.    _______________________________________________________________________ For Office Staff:  Number of people involved in session: 3 Was an Intern/ student involved with case: no

## 2022-05-22 NOTE — Progress Notes (Signed)
Echocardiogram 2D Echocardiogram has been performed.  Fidel Levy 05/22/2022, 11:56 AM

## 2022-05-23 ENCOUNTER — Other Ambulatory Visit: Payer: Self-pay

## 2022-05-25 ENCOUNTER — Ambulatory Visit
Admission: RE | Admit: 2022-05-25 | Discharge: 2022-05-25 | Disposition: A | Discharge status: Home / Self Care | Payer: 59 | Source: Ambulatory Visit | Attending: Hematology and Oncology | Admitting: Hematology and Oncology

## 2022-05-25 DIAGNOSIS — C50412 Malignant neoplasm of upper-outer quadrant of left female breast: Secondary | ICD-10-CM

## 2022-05-25 MED ORDER — GADOPICLENOL 0.5 MMOL/ML IV SOLN
7.5000 mL | Freq: Once | INTRAVENOUS | Status: AC | PRN
  Administered 2022-05-25: 7.5 mL via INTRAVENOUS

## 2022-05-26 ENCOUNTER — Other Ambulatory Visit: Payer: Self-pay

## 2022-05-26 ENCOUNTER — Encounter (HOSPITAL_COMMUNITY): Payer: Self-pay

## 2022-05-26 ENCOUNTER — Encounter (HOSPITAL_COMMUNITY)
Admission: RE | Admit: 2022-05-26 | Discharge: 2022-05-26 | Disposition: A | Discharge status: Home / Self Care | Payer: 59 | Source: Ambulatory Visit | Attending: General Surgery | Admitting: General Surgery

## 2022-05-26 VITALS — BP 145/93 | HR 69 | Temp 98.2°F | Resp 16 | Ht 68.25 in | Wt 177.0 lb

## 2022-05-26 DIAGNOSIS — I4510 Unspecified right bundle-branch block: Secondary | ICD-10-CM | POA: Diagnosis not present

## 2022-05-26 DIAGNOSIS — Z01818 Encounter for other preprocedural examination: Secondary | ICD-10-CM | POA: Insufficient documentation

## 2022-05-26 HISTORY — DX: Anxiety disorder, unspecified: F41.9

## 2022-05-26 HISTORY — DX: Depression, unspecified: F32.A

## 2022-05-26 HISTORY — DX: Gastro-esophageal reflux disease without esophagitis: K21.9

## 2022-05-26 NOTE — Patient Instructions (Signed)
SURGICAL WAITING ROOM VISITATION  Patients having surgery or a procedure may have no more than 2 support people in the waiting area - these visitors may rotate.    Children under the age of 83 must have an adult with them who is not the patient.  Due to an increase in RSV and influenza rates and associated hospitalizations, children ages 75 and under may not visit patients in [place].  If the patient needs to stay at the hospital during part of their recovery, the visitor guidelines for inpatient rooms apply. Pre-op nurse will coordinate an appropriate time for 1 support person to accompany patient in pre-op.  This support person may not rotate.    Please refer to the [facility] website for the visitor guidelines for Inpatients (after your surgery is over and you are in a regular room).       Your procedure is scheduled on:  05/29/22    Report to [facility] Main Entrance    Report to admitting at  0700 AM   Call this number if you have problems the morning of surgery [phone]   Do not eat food :After Midnight.   After Midnight you may have the following liquids until __ 0600____ AM  DAY OF SURGERY  Water Non-Citrus Juices (without pulp, NO RED-Apple, White grape, White cranberry) Black Coffee (NO MILK/CREAM OR CREAMERS, sugar ok)  Clear Tea (NO MILK/CREAM OR CREAMERS, sugar ok) regular and decaf                             Plain Jell-O (NO RED)                                           Fruit ices (not with fruit pulp, NO RED)                                     Popsicles (NO RED)                                                               Sports drinks like Gatorade (NO RED)                    The day of surgery:  Drink ONE (1) Pre-Surgery Clear Ensure or G2 at  0600 AM ( have completed by )  the morning of surgery. Drink in one sitting. Do not sip.  This drink was given to you during your hospital  pre-op appointment visit. Nothing else to  drink after completing the  Pre-Surgery Clear Ensure or G2.          If you have questions, please contact your surgeon's office.       Oral Hygiene is also important to reduce your risk of infection.                                    Remember - BRUSH YOUR TEETH THE MORNING OF SURGERY  WITH YOUR REGULAR TOOTHPASTE  DENTURES WILL BE REMOVED PRIOR TO SURGERY PLEASE DO NOT APPLY "Poly grip" OR ADHESIVES!!!   Do NOT smoke after Midnight   Take these medicines the morning of surgery with A SIP OF WATER:  Zoloft   DO NOT TAKE ANY ORAL DIABETIC MEDICATIONS DAY OF YOUR SURGERY  Bring CPAP mask and tubing day of surgery.                              You may not have any metal on your body including hair pins, jewelry, and body piercing             Do not wear make-up, lotions, powders, perfumes/cologne, or deodorant  Do not wear nail polish including gel and S&S, artificial/acrylic nails, or any other type of covering on natural nails including finger and toenails. If you have artificial nails, gel coating, etc. that needs to be removed by a nail salon please have this removed prior to surgery or surgery may need to be canceled/ delayed if the surgeon/ anesthesia feels like they are unable to be safely monitored.   Do not shave  48 hours prior to surgery.               Men may shave face and neck.   Do not bring valuables to the hospital. [place].   Contacts, glasses, dentures or bridgework may not be worn into surgery.   Bring small overnight bag day of surgery.   DO NOT [place]. PHARMACY WILL DISPENSE MEDICATIONS LISTED ON YOUR MEDICATION LIST TO YOU DURING YOUR ADMISSION [place]!    Patients discharged on the day of surgery will not be allowed to drive home.  Someone NEEDS to stay with you for the first 24 hours after anesthesia.   Special Instructions: Bring a copy of your healthcare  power of attorney and living will documents the day of surgery if you haven't scanned them before.              Please read over the following fact sheets you were given: IF [place] [phone]   If you received a COVID test during your pre-op visit  it is requested that you wear a mask when out in public, stay away from anyone that may not be feeling well and notify your surgeon if you develop symptoms. If you test positive for Covid or have been in contact with anyone that has tested positive in the last 10 days please notify you surgeon.    [place] - Preparing for Surgery Before surgery, you can play an important role.  Because skin is not sterile, your skin needs to be as free of germs as possible.  You can reduce the number of germs on your skin by washing with CHG (chlorahexidine gluconate) soap before surgery.  CHG is an antiseptic cleaner which kills germs and bonds with the skin to continue killing germs even after washing. Please DO NOT use if you have an allergy to CHG or antibacterial soaps.  If your skin becomes reddened/irritated stop using the CHG and inform your nurse when you arrive at Short Stay. Do not shave (including legs and underarms) for at least 48 hours prior to the first CHG  shower.  You may shave your face/neck. Please follow these instructions carefully:  1.  Shower with CHG Soap the night before surgery and the  morning of Surgery.  2.  If you choose to wash your hair, wash your hair first as usual with your  normal  shampoo.  3.  After you shampoo, rinse your hair and body thoroughly to remove the  shampoo.                           4.  Use CHG as you would any other liquid soap.  You can apply chg directly  to the skin and wash                       Gently with a scrungie or clean washcloth.  5.  Apply the CHG Soap to your body ONLY FROM THE NECK DOWN.   Do not use on face/ open                           Wound  or open sores. Avoid contact with eyes, ears mouth and genitals (private parts).                       Wash face,  Genitals (private parts) with your normal soap.             6.  Wash thoroughly, paying special attention to the area where your surgery  will be performed.  7.  Thoroughly rinse your body with warm water from the neck down.  8.  DO NOT shower/wash with your normal soap after using and rinsing off  the CHG Soap.                9.  Pat yourself dry with a clean towel.            10.  Wear clean pajamas.            11.  Place clean sheets on your bed the night of your first shower and do not  sleep with pets. Day of Surgery : Do not apply any lotions/deodorants the morning of surgery.  Please wear clean clothes to the hospital/surgery center.  FAILURE TO FOLLOW THESE INSTRUCTIONS MAY RESULT IN THE CANCELLATION OF YOUR SURGERY PATIENT SIGNATURE_________________________________  NURSE SIGNATURE__________________________________  ________________________________________________________________________

## 2022-05-26 NOTE — Progress Notes (Addendum)
Anesthesia Review:  PCP: Marda Stalker, PAC  Cardiologist :Einar Gip - pt was seen by DR Einar Gip few years back but Dr Einar Gip turned back over to Marda Stalker, [facility] per pt  ( was seen for chest pain )  Chest x-ray : EKG : 05/26/22  Ct cors- 2019  Ct Cardiac 2017  Echo : 05/22/22  Stress test: Cardiac Cath :  Activity level: can do a flight of stairs without difficulty  Sleep Study/ CPAP : none  Fasting Blood Sugar :      / Checks Blood Sugar -- times a day:   Blood Thinner/ Instructions /Last Dose: ASA / Instructions/ Last Dose :  81 mg aspirin    PT has not started any chemo yet.   CBC and CMp done 05/21/22 in epic.

## 2022-05-27 ENCOUNTER — Other Ambulatory Visit: Payer: 59

## 2022-05-27 ENCOUNTER — Other Ambulatory Visit (HOSPITAL_COMMUNITY): Payer: 59

## 2022-05-27 ENCOUNTER — Other Ambulatory Visit: Payer: Self-pay

## 2022-05-27 ENCOUNTER — Inpatient Hospital Stay: Payer: 59

## 2022-05-27 DIAGNOSIS — Z5181 Encounter for therapeutic drug level monitoring: Secondary | ICD-10-CM | POA: Insufficient documentation

## 2022-05-27 LAB — ECHOCARDIOGRAM COMPLETE
Area-P 1/2: 5.13 cm2
Calc EF: 65.2 %
P 1/2 time: 384 msec
S' Lateral: 2.9 cm
Single Plane A2C EF: 59.3 %
Single Plane A4C EF: 68.9 %

## 2022-05-28 ENCOUNTER — Encounter: Payer: Self-pay | Admitting: Hematology and Oncology

## 2022-05-29 ENCOUNTER — Ambulatory Visit (HOSPITAL_COMMUNITY)
Admission: RE | Admit: 2022-05-29 | Discharge: 2022-05-29 | Disposition: A | Discharge status: Home / Self Care | Payer: 59 | Source: Ambulatory Visit | Attending: General Surgery | Admitting: General Surgery

## 2022-05-29 ENCOUNTER — Ambulatory Visit (HOSPITAL_COMMUNITY): Payer: 59

## 2022-05-29 ENCOUNTER — Encounter (HOSPITAL_COMMUNITY): Payer: Self-pay | Admitting: General Surgery

## 2022-05-29 ENCOUNTER — Other Ambulatory Visit: Payer: Self-pay

## 2022-05-29 ENCOUNTER — Telehealth: Payer: Self-pay | Admitting: *Deleted

## 2022-05-29 ENCOUNTER — Encounter (HOSPITAL_COMMUNITY)
Admission: RE | Disposition: A | Discharge status: Home / Self Care | Payer: Self-pay | Source: Ambulatory Visit | Attending: General Surgery

## 2022-05-29 ENCOUNTER — Ambulatory Visit (HOSPITAL_COMMUNITY): Payer: 59 | Admitting: Physician Assistant

## 2022-05-29 ENCOUNTER — Ambulatory Visit (HOSPITAL_BASED_OUTPATIENT_CLINIC_OR_DEPARTMENT_OTHER): Payer: 59 | Admitting: Certified Registered Nurse Anesthetist

## 2022-05-29 ENCOUNTER — Encounter: Payer: Self-pay | Admitting: *Deleted

## 2022-05-29 DIAGNOSIS — C50412 Malignant neoplasm of upper-outer quadrant of left female breast: Secondary | ICD-10-CM | POA: Insufficient documentation

## 2022-05-29 DIAGNOSIS — C50411 Malignant neoplasm of upper-outer quadrant of right female breast: Secondary | ICD-10-CM | POA: Diagnosis not present

## 2022-05-29 DIAGNOSIS — Z01818 Encounter for other preprocedural examination: Secondary | ICD-10-CM

## 2022-05-29 HISTORY — PX: PORTACATH PLACEMENT: SHX2246

## 2022-05-29 SURGERY — INSERTION, TUNNELED CENTRAL VENOUS DEVICE, WITH PORT
Anesthesia: General

## 2022-05-29 MED ORDER — FENTANYL CITRATE PF 50 MCG/ML IJ SOSY: 25.0000 ug | PREFILLED_SYRINGE | INTRAMUSCULAR | Status: DC | PRN

## 2022-05-29 MED ORDER — BUPIVACAINE-EPINEPHRINE 0.25% -1:200000 IJ SOLN
INTRAMUSCULAR | Status: DC | PRN
  Administered 2022-05-29: 10 mL

## 2022-05-29 MED ORDER — ONDANSETRON HCL 4 MG/2ML IJ SOLN
INTRAMUSCULAR | Status: DC | PRN
  Administered 2022-05-29: 4 mg via INTRAVENOUS

## 2022-05-29 MED ORDER — PROPOFOL 10 MG/ML IV BOLUS
INTRAVENOUS | Status: DC | PRN
  Administered 2022-05-29: 170 mg via INTRAVENOUS

## 2022-05-29 MED ORDER — DEXAMETHASONE SODIUM PHOSPHATE 10 MG/ML IJ SOLN
INTRAMUSCULAR | Status: AC
  Filled 2022-05-29: qty 1

## 2022-05-29 MED ORDER — ONDANSETRON HCL 4 MG/2ML IJ SOLN
INTRAMUSCULAR | Status: AC
  Filled 2022-05-29: qty 2

## 2022-05-29 MED ORDER — FENTANYL CITRATE (PF) 100 MCG/2ML IJ SOLN
INTRAMUSCULAR | Status: AC
  Filled 2022-05-29: qty 2

## 2022-05-29 MED ORDER — LACTATED RINGERS IV SOLN: INTRAVENOUS | Status: DC

## 2022-05-29 MED ORDER — CHLORHEXIDINE GLUCONATE CLOTH 2 % EX PADS: 6.0000 | MEDICATED_PAD | Freq: Once | CUTANEOUS | Status: DC

## 2022-05-29 MED ORDER — FENTANYL CITRATE (PF) 100 MCG/2ML IJ SOLN
INTRAMUSCULAR | Status: DC | PRN
  Administered 2022-05-29 (×2): 50 ug via INTRAVENOUS

## 2022-05-29 MED ORDER — LIDOCAINE 2% (20 MG/ML) 5 ML SYRINGE
INTRAMUSCULAR | Status: DC | PRN
  Administered 2022-05-29: 100 mg via INTRAVENOUS

## 2022-05-29 MED ORDER — CHLORHEXIDINE GLUCONATE 0.12 % MT SOLN
15.0000 mL | Freq: Once | OROMUCOSAL | Status: AC
  Administered 2022-05-29: 15 mL via OROMUCOSAL

## 2022-05-29 MED ORDER — HEPARIN 6000 UNIT IRRIGATION SOLUTION
Freq: Once | Status: AC
  Administered 2022-05-29: 1
  Filled 2022-05-29: qty 6000

## 2022-05-29 MED ORDER — AMISULPRIDE (ANTIEMETIC) 5 MG/2ML IV SOLN: 10.0000 mg | Freq: Once | INTRAVENOUS | Status: DC | PRN

## 2022-05-29 MED ORDER — OXYCODONE HCL 5 MG PO TABS: 5.0000 mg | ORAL_TABLET | Freq: Four times a day (QID) | ORAL | 0 refills | Status: DC | PRN

## 2022-05-29 MED ORDER — CIPROFLOXACIN IN D5W 400 MG/200ML IV SOLN
400.0000 mg | INTRAVENOUS | Status: AC
  Administered 2022-05-29: 400 mg via INTRAVENOUS
  Filled 2022-05-29: qty 200

## 2022-05-29 MED ORDER — LIDOCAINE HCL (PF) 2 % IJ SOLN
INTRAMUSCULAR | Status: AC
  Filled 2022-05-29: qty 5

## 2022-05-29 MED ORDER — ORAL CARE MOUTH RINSE: 15.0000 mL | Freq: Once | OROMUCOSAL | Status: AC

## 2022-05-29 MED ORDER — HEPARIN SOD (PORK) LOCK FLUSH 100 UNIT/ML IV SOLN
INTRAVENOUS | Status: AC
  Filled 2022-05-29: qty 5

## 2022-05-29 MED ORDER — OXYCODONE HCL 5 MG PO TABS: 5.0000 mg | ORAL_TABLET | Freq: Once | ORAL | Status: DC | PRN

## 2022-05-29 MED ORDER — MIDAZOLAM HCL 2 MG/2ML IJ SOLN
INTRAMUSCULAR | Status: AC
  Filled 2022-05-29: qty 2

## 2022-05-29 MED ORDER — 0.9 % SODIUM CHLORIDE (POUR BTL) OPTIME
TOPICAL | Status: DC | PRN
  Administered 2022-05-29: 1000 mL

## 2022-05-29 MED ORDER — OXYCODONE HCL 5 MG/5ML PO SOLN: 5.0000 mg | Freq: Once | ORAL | Status: DC | PRN

## 2022-05-29 MED ORDER — DEXAMETHASONE SODIUM PHOSPHATE 4 MG/ML IJ SOLN
INTRAMUSCULAR | Status: DC | PRN
  Administered 2022-05-29: 5 mg via INTRAVENOUS

## 2022-05-29 MED ORDER — EPHEDRINE SULFATE-NACL 50-0.9 MG/10ML-% IV SOSY
PREFILLED_SYRINGE | INTRAVENOUS | Status: DC | PRN
  Administered 2022-05-29: 5 mg via INTRAVENOUS

## 2022-05-29 MED ORDER — MIDAZOLAM HCL 2 MG/2ML IJ SOLN
INTRAMUSCULAR | Status: DC | PRN
  Administered 2022-05-29: 2 mg via INTRAVENOUS

## 2022-05-29 MED ORDER — BUPIVACAINE-EPINEPHRINE (PF) 0.25% -1:200000 IJ SOLN
INTRAMUSCULAR | Status: AC
  Filled 2022-05-29: qty 30

## 2022-05-29 MED ORDER — HEPARIN SOD (PORK) LOCK FLUSH 100 UNIT/ML IV SOLN
INTRAVENOUS | Status: DC | PRN
  Administered 2022-05-29: 500 [IU] via INTRAVENOUS

## 2022-05-29 MED ORDER — ENSURE PRE-SURGERY PO LIQD: 296.0000 mL | Freq: Once | ORAL | Status: DC

## 2022-05-29 MED ORDER — ACETAMINOPHEN 500 MG PO TABS
1000.0000 mg | ORAL_TABLET | ORAL | Status: AC
  Administered 2022-05-29: 1000 mg via ORAL
  Filled 2022-05-29: qty 2

## 2022-05-29 MED ORDER — LIDOCAINE HCL 1 % IJ SOLN
INTRAMUSCULAR | Status: DC | PRN
  Administered 2022-05-29: 10 mL

## 2022-05-29 MED ORDER — LIDOCAINE HCL 1 % IJ SOLN
INTRAMUSCULAR | Status: AC
  Filled 2022-05-29: qty 20

## 2022-05-29 SURGICAL SUPPLY — 39 items
ADH SKN CLS APL DERMABOND .7 (GAUZE/BANDAGES/DRESSINGS) ×1
APL PRP STRL LF DISP 70% ISPRP (MISCELLANEOUS) ×1
BAG COUNTER SPONGE SURGICOUNT (BAG) IMPLANT
BAG DECANTER FOR FLEXI CONT (MISCELLANEOUS) ×2 IMPLANT
BAG SPNG CNTER NS LX DISP (BAG)
BLADE HEX COATED 2.75 (ELECTRODE) ×2 IMPLANT
BLADE SURG 15 STRL LF DISP TIS (BLADE) ×2 IMPLANT
BLADE SURG 15 STRL SS (BLADE) ×1
BLADE SURG SZ11 CARB STEEL (BLADE) ×2 IMPLANT
CHLORAPREP W/TINT 26 (MISCELLANEOUS) ×2 IMPLANT
COVER SURGICAL LIGHT HANDLE (MISCELLANEOUS) ×2 IMPLANT
DERMABOND ADVANCED .7 DNX12 (GAUZE/BANDAGES/DRESSINGS) ×2 IMPLANT
DRAPE C-ARM 42X120 X-RAY (DRAPES) ×2 IMPLANT
DRAPE LAPAROTOMY TRNSV 102X78 (DRAPES) ×2 IMPLANT
DRAPE UTILITY XL STRL (DRAPES) ×2 IMPLANT
ELECT REM PT RETURN 15FT ADLT (MISCELLANEOUS) ×2 IMPLANT
GAUZE 4X4 16PLY ~~LOC~~+RFID DBL (SPONGE) ×2 IMPLANT
GLOVE BIO SURGEON STRL SZ 6 (GLOVE) ×2 IMPLANT
GLOVE INDICATOR 6.5 STRL GRN (GLOVE) ×2 IMPLANT
GOWN SPEC L3 XXLG W/TWL (GOWN DISPOSABLE) ×2 IMPLANT
GOWN STRL REUS W/ TWL XL LVL3 (GOWN DISPOSABLE) ×2 IMPLANT
GOWN STRL REUS W/TWL XL LVL3 (GOWN DISPOSABLE) ×1
KIT BASIN OR (CUSTOM PROCEDURE TRAY) ×2 IMPLANT
KIT PORT POWER 8FR ISP CVUE (Port) IMPLANT
KIT TURNOVER KIT A (KITS) IMPLANT
NDL HYPO 22X1.5 SAFETY MO (MISCELLANEOUS) ×2 IMPLANT
NEEDLE HYPO 22X1.5 SAFETY MO (MISCELLANEOUS) ×1 IMPLANT
NEEDLE SAFETY HYPO 22GAX1.5 (MISCELLANEOUS) ×1
PACK BASIC VI WITH GOWN DISP (CUSTOM PROCEDURE TRAY) ×2 IMPLANT
PENCIL SMOKE EVACUATOR (MISCELLANEOUS) IMPLANT
SPIKE FLUID TRANSFER (MISCELLANEOUS) ×2 IMPLANT
SUT MNCRL AB 4-0 PS2 18 (SUTURE) ×2 IMPLANT
SUT PROLENE 2 0 SH DA (SUTURE) ×4 IMPLANT
SUT VIC AB 3-0 SH 27 (SUTURE) ×1
SUT VIC AB 3-0 SH 27X BRD (SUTURE) ×2 IMPLANT
SYR 10ML LL (SYRINGE) ×2 IMPLANT
SYR CONTROL 10ML LL (SYRINGE) ×2 IMPLANT
TOWEL OR 17X26 10 PK STRL BLUE (TOWEL DISPOSABLE) ×2 IMPLANT
TOWEL OR NON WOVEN STRL DISP B (DISPOSABLE) ×2 IMPLANT

## 2022-05-29 NOTE — Discharge Instructions (Addendum)
[place] Office Phone Number [phone]   POST OP INSTRUCTIONS  Always review your discharge instruction sheet given to you by the facility where your surgery was performed.  IF YOU HAVE DISABILITY OR FAMILY LEAVE FORMS, YOU MUST BRING THEM TO THE OFFICE FOR PROCESSING.  DO NOT GIVE THEM TO YOUR DOCTOR.  Take 2 tylenol (acetominophen) three times a day for 3 days.  If you still have pain, add ibuprofen with food in between if able to take this (if you have kidney issues or stomach issues, do not take ibuprofen).  If both of those are not enough, add the narcotic pain pill.  If you find you are needing a lot of this overnight after surgery, call the next morning for a refill.   Take your usually prescribed medications unless otherwise directed If you need a refill on your pain medication, please contact your pharmacy.  They will contact our office to request authorization.  Prescriptions will not be filled after 5pm or on week-ends. You should eat very light the first 24 hours after surgery, such as soup, crackers, pudding, etc.  Resume your normal diet the day after surgery It is common to experience some constipation if taking pain medication after surgery.  Increasing fluid intake and taking a stool softener will usually help or prevent this problem from occurring.  A mild laxative (Milk of Magnesia or Miralax) should be taken according to package directions if there are no bowel movements after 48 hours. You may shower in 48 hours.  The surgical glue will flake off in 2-3 weeks.   ACTIVITIES:  No strenuous activity or heavy lifting for 1 week.   You may drive when you no longer are taking prescription pain medication, you can comfortably wear a seatbelt, and you can safely maneuver your car and apply brakes. RETURN TO WORK:  __________as tolerated.  _______________ Dennis Bast should see your doctor in the office for a follow-up appointment approximately three-four weeks after your  surgery.    WHEN TO CALL YOUR DOCTOR: Fever over 101.0 Nausea and/or vomiting. Extreme swelling or bruising. Continued bleeding from incision. Increased pain, redness, or drainage from the incision.  The clinic staff is available to answer your questions during regular business hours.  Please don't hesitate to call and ask to speak to one of the nurses for clinical concerns.  If you have a medical emergency, go to the nearest emergency room or call 911.  A surgeon from [facility] Surgery is always on call at the hospital.  For further questions, please visit [email or web address]

## 2022-05-29 NOTE — Op Note (Signed)
PREOPERATIVE DIAGNOSIS:  left breast cancer, upper outer quadrant, -/-/+, cT2N1     POSTOPERATIVE DIAGNOSIS:  Same     PROCEDURE: Left subclavian port placement, Bard ClearVue Power Port, MRI safe, 8-French.      SURGEON:  Stark Klein, MD      ANESTHESIA:  General   FINDINGS:  Good venous return, easy flush, and tip of the catheter and   SVC 25 cm.      SPECIMEN:  None.      ESTIMATED BLOOD LOSS:  Minimal.      COMPLICATIONS:  None known.      PROCEDURE:  Pt was identified in the holding area and taken to   the operating room, where patient was placed supine on the operating room   table.  General anesthesia was induced.  Patient's arms were tucked and the upper   chest and neck were prepped and draped in sterile fashion.  Time-out was   performed according to the surgical safety check list.  When all was   correct, we continued.   Local anesthetic was administered over this   area at the angle of the clavicle.  The vein was accessed with 1 pass(es) of the needle. There was good venous return and the wire passed easily with no ectopy.   Fluoroscopy was used to confirm that the wire was in the vena cava.      The patient was placed back level and the area for the pocket was anethetized   with local anesthetic.  A 3-cm transverse incision was made with a #15   blade.  Cautery was used to divide the subcutaneous tissues down to the   pectoralis muscle.  An [employer] retractor was used to elevate the skin   while a pocket was created on top of the pectoralis fascia.  The port   was placed into the pocket to confirm that it was of adequate size.  The   catheter was preattached to the port.  The port was then secured to the   pectoralis fascia with four 2-0 Prolene sutures.  These were clamped and   not tied down yet.    The catheter was tunneled through to the wire exit   site.  The catheter was placed along the wire to determine what length it should be to be in the SVC.  The  catheter was cut at 25 cm.  The tunneler sheath and dilator were passed over the wire and the dilator and wire were removed.  The catheter was advanced through the tunneler sheath and the tunneler sheath was pulled away.  Care was taken to keep the catheter in the tunneler sheath as this occurred. This was advanced and the tunneler sheath was removed.  There was good venous   return and easy flush of the catheter.  The Prolene sutures were tied   down to the pectoral fascia.  The skin was reapproximated using 3-0   Vicryl interrupted deep dermal sutures.    Fluoroscopy was used to re-confirm good position of the catheter.  The skin   was then closed using 4-0 Monocryl in a subcuticular fashion.  The port was flushed with concentrated heparin flush as well.  The wounds were then cleaned, dried, and dressed with Dermabond.  The patient was awakened from anesthesia and taken to the PACU in stable condition.  Needle, sponge, and instrument counts were correct.               Dorris Fetch  Barry Dienes, MD

## 2022-05-29 NOTE — Interval H&P Note (Signed)
History and Physical Interval Note:  05/29/2022 8:00 AM  Erika [name]  has presented today for surgery, with the diagnosis of left breast cancer.  The various methods of treatment have been discussed with the patient and family. After consideration of risks, benefits and other options for treatment, the patient has consented to  Procedure(s): INSERTION PORT-A-CATH (N/A) as a surgical intervention.  The patient's history has been reviewed, patient examined, no change in status, stable for surgery.  I have reviewed the patient's chart and labs.  Questions were answered to the patient's satisfaction.     Stark Klein

## 2022-05-29 NOTE — Anesthesia Preprocedure Evaluation (Addendum)
Anesthesia Evaluation  Patient identified by MRN, date of birth, ID band Patient awake    Reviewed: Allergy & Precautions, NPO status , Patient's Chart, lab work & pertinent test results  History of Anesthesia Complications Negative for: history of anesthetic complications  Airway Mallampati: II  TM Distance: >3 FB Neck ROM: Full    Dental no notable dental hx.    Pulmonary neg pulmonary ROS   Pulmonary exam normal        Cardiovascular hypertension, Pt. on medications Normal cardiovascular exam     Neuro/Psych   Anxiety Depression    negative neurological ROS     GI/Hepatic Neg liver ROS,GERD  ,,  Endo/Other  negative endocrine ROS    Renal/GU negative Renal ROS  negative genitourinary   Musculoskeletal negative musculoskeletal ROS (+)    Abdominal   Peds  Hematology negative hematology ROS (+)   Anesthesia Other Findings Left breast ca  Reproductive/Obstetrics negative OB ROS                             Anesthesia Physical Anesthesia Plan  ASA: 2  Anesthesia Plan: General   Post-op Pain Management: Tylenol PO (pre-op)*   Induction: Intravenous  PONV Risk Score and Plan: 3 and Treatment may vary due to age or medical condition, Ondansetron, Dexamethasone and Midazolam  Airway Management Planned: LMA  Additional Equipment: None  Intra-op Plan:   Post-operative Plan: Extubation in OR  Informed Consent: I have reviewed the patients History and Physical, chart, labs and discussed the procedure including the risks, benefits and alternatives for the proposed anesthesia with the patient or authorized representative who has indicated his/her understanding and acceptance.     Dental advisory given  Plan Discussed with: CRNA  Anesthesia Plan Comments:        Anesthesia Quick Evaluation

## 2022-05-29 NOTE — Anesthesia Procedure Notes (Signed)
Procedure Name: LMA Insertion Date/Time: 05/29/2022 9:23 AM  Performed by: Claudia Desanctis, CRNAPre-anesthesia Checklist: Emergency Drugs available, Patient identified, Suction available and Patient being monitored Patient Re-evaluated:Patient Re-evaluated prior to induction Oxygen Delivery Method: Circle system utilized Preoxygenation: Pre-oxygenation with 100% oxygen Induction Type: IV induction Ventilation: Mask ventilation without difficulty LMA: LMA inserted LMA Size: 4.0 Number of attempts: 1 Placement Confirmation: positive ETCO2 and breath sounds checked- equal and bilateral Tube secured with: Tape Dental Injury: Teeth and Oropharynx as per pre-operative assessment

## 2022-05-29 NOTE — H&P (Signed)
REFERRING PHYSICIAN: Sarajane Jews, MD  PROVIDER: Georgianne Fick, MD  Care Team: Patient Care Team: Marda Stalker, [place] as PCP - General (Family Medicine) Georgianne Fick, MD as Consulting Provider (Surgical Oncology) Rulon Eisenmenger, MD (Hematology and Oncology) Blair Promise, MD (Radiation Oncology)  MRN: [id number] DOB: Jan 20, 1961 DATE OF ENCOUNTER: 05/22/2022  Subjective  Chief Complaint: Breast Cancer  History of Present Illness: Erika [name] is a 62 y.o. female who is seen today as an office consultation at the request of Dr. Lindi Adie for evaluation of Breast Cancer  Pt presents with a new dx of left breast cancer 05/2022. She had a palpable mass that she discovered. She had diagnostic imaging showing a 2.9 cm mass by mammo and 4.2 cm mass by u/s in the upper outer quadrant. There was also an abnormal lymph node present in the axilla. 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 Node was positive for cancer as well. She presents to discuss. Breast density is c. She denies breast pain or other breast issues.  She is here with her family.  Family cancer history: father had lung cancer, o/w negative. Menarche 43 Menopause 21 ish Parity 0  Work: Engineer, structural.  Diagnostic mammogram/US 05/08/22 [facility] ACR Breast Density Category c: The breast tissue is heterogeneously dense, which may obscure small masses.  FINDINGS: Partially obscured irregular mass within the upper-outer left breast at the site of palpable concern. No additional masses, calcifications or distortion identified within either breast.  On physical exam, there is a firm palpable mass within the upper-outer left breast.  Targeted ultrasound is performed, showing a 2.9 x 2.2 x 4.2 cm irregular hypoechoic mass left breast 1 o'clock position 6 cm from nipple corresponding with the palpable area of concern. There is a focally thickened left  axillary lymph node with a cortex measuring up to 5 mm.  IMPRESSION: Suspicious palpable left breast mass 1 o'clock position.  Indeterminate focally thickened left axillary lymph node.  RECOMMENDATION: Ultrasound-guided core needle biopsy left breast mass 1 o'clock position.  Ultrasound-guided core needle biopsy focally thickened left axillary lymph node.  I have discussed the findings and recommendations with the patient. If applicable, a reminder letter will be sent to the patient regarding the next appointment.  BI-RADS CATEGORY 5: Highly suggestive of malignancy.   Pathology core needle biopsy: 05/15/2022 1. Breast, left, needle core biopsy, upper outer quadrant at 1 o'clock, 6cmfn (heart clip) INVASIVE POORLY DIFFERENTIATED DUCTAL ADENOCARCINOMA, GRADE 3 (3+2+3) TUBULE FORMATION: SCORE 3 NUCLEAR PLEOMORPHISM: SCORE 2 MITOTIC COUNT: SCORE 3 TOTAL SCORE 8 OF 9 OVERALL GRADE GRADE 3 LYMPHOVASCULAR INVASION NOT IDENTIFIED NEGATIVE FOR MICROCALCIFICATIONS TUMOR MEASURES 12 MM IN GREATEST LINEAR EXTENT 2. Lymph node, needle/core biopsy, left axilla (hydromark spiral clip) METASTATIC DUCTAL ADENOCARCINOMA NODAL TISSUE PRESENT  Receptors: The tumor cells are POSITIVE for Her2 (3+). Estrogen Receptor: 0%, NEGATIVE Progesterone Receptor: 0%, NEGATIVE Proliferation Marker Ki67: 35%  Review of Systems: A complete review of systems was obtained from the patient. I have reviewed this information and discussed as appropriate with the patient. See HPI as well for other ROS.  Medical History: Past Medical History: Diagnosis Date Anxiety GERD (gastroesophageal reflux disease) History of cancer Hypertension  Patient Active Problem List Diagnosis Malignant neoplasm of upper-outer quadrant of left breast in female, estrogen receptor negative Serrated polyp of colon  Past Surgical History: Procedure Laterality Date ."Freezing of Cervix." CESAREAN  SECTION   Allergies Allergen Reactions  Penicillins Nausea and Other (See Comments) Makes patient ill  Current Outpatient Medications on File Prior to Visit Medication Sig Dispense Refill aspirin 81 MG EC tablet Take 81 mg by mouth once daily lisinopriL (ZESTRIL) 20 MG tablet Take 1 tablet by mouth once daily omeprazole (PRILOSEC) 20 MG DR capsule Take 20 mg by mouth once daily rosuvastatin (CRESTOR) 20 MG tablet Take 1 tablet by mouth once daily sertraline (ZOLOFT) 100 MG tablet Take 100 mg by mouth once daily  No current facility-administered medications on file prior to visit.  Family History Problem Relation Age of Onset High blood pressure (Hypertension) Father Coronary Artery Disease (Blocked arteries around heart) Father High blood pressure (Hypertension) Brother Coronary Artery Disease (Blocked arteries around heart) Brother   Social History  Tobacco Use Smoking Status Former Types: Cigarettes Smokeless Tobacco Never Tobacco Comments Quit in college, smoked 1 year while in college   Social History  Socioeconomic History Marital status: Married Tobacco Use Smoking status: Former Types: Cigarettes Smokeless tobacco: Never Tobacco comments: Quit in college, smoked 1 year while in college Vaping Use Vaping Use: Never used Substance and Sexual Activity Alcohol use: Yes Drug use: Never  Objective:  Vitals: 05/22/22 1049 BP: (!) 141/86 Pulse: 80 Resp: 16 Temp: 36.5 C (97.7 F) Weight: 83.2 kg (183 lb 8 oz) Height: 246.4 cm (8' 1"$ )  Body mass index is 13.71 kg/m.  Gen: No acute distress. Well nourished and well groomed. Neurological: Alert and oriented to person, place, and time. Coordination normal. Head: Normocephalic and atraumatic. Eyes: Conjunctivae are normal. Pupils are equal, round, and reactive to light. No scleral icterus. Neck: Normal range of motion. Neck supple. No tracheal deviation or thyromegaly present. Cardiovascular: Normal  rate, regular rhythm, normal heart sounds and intact distal pulses. Exam reveals no gallop and no friction rub. No murmur heard. Breast: left breast larger than right. Mild ptosis. Palpable mass UOQ left. Mobile. Around 4 cm. Some bruising at biopsy site. No palpable LAD. No nipple retraction or nipple discharge. No lymphedema or breast. Right breast benign. Respiratory: Effort normal. No respiratory distress. No chest wall tenderness. Breath sounds normal. No wheezes, rales or rhonchi. GI: Soft. Bowel sounds are normal. The abdomen is soft and nontender. There is no rebound and no guarding. Musculoskeletal: Normal range of motion. Extremities are nontender. Lymphadenopathy: No cervical, preauricular, postauricular or axillary adenopathy is present Skin: Skin is warm and dry. No rash noted. No diaphoresis. No erythema. No pallor. No clubbing, cyanosis, or edema. Psychiatric: Normal mood and affect. Behavior is normal. Judgment and thought content normal.  Labs Cbc, CMET essentially normal other than Gluc 124  Assessment and Plan:  ICD-10-CM 1. Malignant neoplasm of upper-outer quadrant of left breast in female, estrogen receptor negative C50.412 Z17.1   Pt has a new diagnosis of cT2N1 left breast cancer. Due to the prognostic profile, size of mass, and positive node, neoadjuvant treatment is recommended. We will get MR to assess the same breast and the other breast pre-chemo. Genetics is offered. Barring unforeseen findings on MR or change in patient desires, we will plan breast conservation and targeted/SLN biopsy post chemo. This will be followed by radiation, antihormonal tx, and completion of 1 year of anti her 2 tx.  I will plan port placement very soon for chemo. She needs chemo class and echo.  I discussed port placement with a port model and anatomy diagram. We discussed the procedure as well as the risks. I discussed risk of pneumothorax, malposition, malfunction, possible need for  chest tube or additional procedures to revise position.  I also discussed briefly what breast conserving surgery would entail with seed placement in the breast and lymph node as well as the block, nipple injection for SLN, and incision/post op restrictions and expectations.  No follow-ups on file.  Milus Height, MD FACS Surgical Oncology, General Surgery, Trauma and [facility] [place]

## 2022-05-29 NOTE — Transfer of Care (Signed)
Immediate Anesthesia Transfer of Care Note  Patient: Erika [name]  Procedure(s) Performed: INSERTION PORT-A-CATH  Patient Location: PACU  Anesthesia Type:General  Level of Consciousness: awake and patient cooperative  Airway & Oxygen Therapy: Patient Spontanous Breathing and Patient connected to face mask  Post-op Assessment: Report given to RN and Post -op Vital signs reviewed and stable  Post vital signs: Reviewed and stable  Last Vitals:  Vitals Value Taken Time  BP 148/87 05/29/22 1010  Temp    Pulse 84 05/29/22 1011  Resp 11 05/29/22 1011  SpO2 100 % 05/29/22 1011  Vitals shown include unvalidated device data.  Last Pain:  Vitals:   05/29/22 0727  TempSrc:   PainSc: 0-No pain         Complications: No notable events documented.

## 2022-05-29 NOTE — Telephone Encounter (Signed)
Spoke to pt regarding [facility] from 2.14.24. Denies questions or concerns regarding dx or treatment care plan. Discussed future appts Encourage pt to call with needs. Received verbal understanding.

## 2022-05-29 NOTE — Progress Notes (Signed)
Pharmacist Chemotherapy Monitoring - Initial Assessment    Anticipated start date: 06/05/22   The following has been reviewed per standard work regarding the patient's treatment regimen: The patient's diagnosis, treatment plan and drug doses, and organ/hematologic function Lab orders and baseline tests specific to treatment regimen  The treatment plan start date, drug sequencing, and pre-medications Prior authorization status  Patient's documented medication list, including drug-drug interaction screen and prescriptions for anti-emetics and supportive care specific to the treatment regimen The drug concentrations, fluid compatibility, administration routes, and timing of the medications to be used The patient's access for treatment and lifetime cumulative dose history, if applicable  The patient's medication allergies and previous infusion related reactions, if applicable   Changes made to treatment plan:  treatment plan date  Follow up needed:  Pending authorization for treatment    Judge Stall, [place], 05/29/2022  4:05 PM

## 2022-05-29 NOTE — Anesthesia Postprocedure Evaluation (Signed)
Anesthesia Post Note  Patient: Erika [name]  Procedure(s) Performed: INSERTION PORT-A-CATH     Patient location during evaluation: PACU Anesthesia Type: General Level of consciousness: awake and alert Pain management: pain level controlled Vital Signs Assessment: post-procedure vital signs reviewed and stable Respiratory status: spontaneous breathing, nonlabored ventilation and respiratory function stable Cardiovascular status: blood pressure returned to baseline Postop Assessment: no apparent nausea or vomiting Anesthetic complications: no   No notable events documented.  Last Vitals:  Vitals:   05/29/22 1045 05/29/22 1105  BP: (!) 152/87 (!) 163/97  Pulse: 72 64  Resp: 13 12  Temp: 36.7 C 36.4 C  SpO2: 100% 98%    Last Pain:  Vitals:   05/29/22 1105  TempSrc: Oral  PainSc:                  [name]

## 2022-05-30 ENCOUNTER — Other Ambulatory Visit: Payer: 59

## 2022-05-30 ENCOUNTER — Encounter (HOSPITAL_COMMUNITY): Payer: Self-pay | Admitting: General Surgery

## 2022-05-30 ENCOUNTER — Encounter: Payer: Self-pay | Admitting: Genetic Counselor

## 2022-05-30 ENCOUNTER — Ambulatory Visit: Payer: Self-pay | Admitting: Genetic Counselor

## 2022-05-30 ENCOUNTER — Telehealth: Payer: Self-pay

## 2022-05-30 ENCOUNTER — Telehealth: Payer: Self-pay | Admitting: Genetic Counselor

## 2022-05-30 ENCOUNTER — Other Ambulatory Visit (HOSPITAL_COMMUNITY): Payer: 59

## 2022-05-30 DIAGNOSIS — Z1379 Encounter for other screening for genetic and chromosomal anomalies: Secondary | ICD-10-CM

## 2022-05-30 DIAGNOSIS — Z171 Estrogen receptor negative status [ER-]: Secondary | ICD-10-CM

## 2022-05-30 DIAGNOSIS — C50412 Malignant neoplasm of upper-outer quadrant of left female breast: Secondary | ICD-10-CM

## 2022-05-30 DIAGNOSIS — Z8051 Family history of malignant neoplasm of kidney: Secondary | ICD-10-CM

## 2022-05-30 NOTE — Telephone Encounter (Signed)
S2205, ICE COMPRESS: RANDOMIZED TRIAL OF LIMB CRYOCOMPRESSION VERSUS CONTINUOUS COMPRESSION VERSUS LOW CYCLIC COMPRESSION FOR THE PREVENTION  OF TAXANE-INDUCED PERIPHERAL NEUROPATHY  Rec'd referral from Dr Lindi Adie. Patient had port placed yesterday, 05/29/22, and she is scheduled to start chemo on 06/05/22.   I called her to discuss the study and got her voicemail. Left message with my direct number requesting call back.  Marjie Skiff Curly Mackowski, RN, BSN, [facility] She  Her  Hers Clinical Research Nurse [facility] Direct Dial [phone]  Pager [phone] 05/30/2022 10:36 AM

## 2022-05-30 NOTE — Telephone Encounter (Signed)
S2205, ICE COMPRESS: RANDOMIZED TRIAL OF LIMB CRYOCOMPRESSION VERSUS CONTINUOUS COMPRESSION VERSUS LOW CYCLIC COMPRESSION FOR THE PREVENTION  OF TAXANE-INDUCED PERIPHERAL NEUROPATHY   Patient Erika [name] was identified by Dr Lindi Adie as a potential candidate for the above listed study.  This Clinical Research Nurse met with [name], [id number], on today via phone to discuss participation in the above listed research study.  A copy of the informed consent document with embedded HIPAA language was emailed to the patient at her request.  Patient reads, speaks, and understands [place].   Patient was provided with the contact information of this Nurse and encouraged to contact the research team with any questions.  Approximately 10 minutes were spent with the patient reviewing the informed consent documents.  Patient was provided the option of taking informed consent documents home to review and was encouraged to review at their convenience with their support network, including other care providers.  Ms Waldrum has an appt already at [facility] on Tuesday 06/03/22, so we plan to meet after that appointment.  Marjie Skiff Menaal Russum, RN, BSN, [facility] She  Her  Hers Clinical Research Nurse [facility] Direct Dial [phone]  Pager [phone] 05/30/2022 11:18 AM

## 2022-05-30 NOTE — Telephone Encounter (Signed)
Revealed negative genetics

## 2022-05-30 NOTE — Progress Notes (Signed)
HPI:   Ms. Falardeau was previously seen in the [facility] due to a personal history of breast cancer and concerns regarding a hereditary predisposition to cancer. Please refer to our prior cancer genetics clinic note for more information regarding our discussion, assessment and recommendations, at the time. Ms. Wurzel recent genetic test results were disclosed to her, as were recommendations warranted by these results. These results and recommendations are discussed in more detail below.  CANCER HISTORY:  Oncology History  Malignant neoplasm of upper-outer quadrant of left breast in female, estrogen receptor negative ([place])  05/15/2022 Initial Diagnosis   Palpable mass in the left breast at 1 o'clock position measured 4.2 cm ultrasound-guided biopsy revealed grade 3 IDC ER/PR negative HER2 positive Ki-67 35%, left axillary lymph node: Biopsy positive   05/21/2022 Cancer Staging   Staging form: Breast, AJCC 8th Edition - Clinical stage from 05/21/2022: Stage IIB (cT2, cN1, cM0, G3, ER-, PR-, HER2+) - Signed by Nicholas Lose, MD on 05/21/2022 Stage prefix: Initial diagnosis Histologic grading system: 3 grade system   05/29/2022 Genetic Testing   Negative [facility] Multi-Cancer +RNA Panel.  Report date is 05/29/2022.   The Multi-Cancer + RNA Panel offered by [facility] includes sequencing and/or deletion/duplication analysis of the following 70 genes:  AIP*, ALK, APC*, ATM*, AXIN2*, BAP1*, BARD1*, BLM*, BMPR1A*, BRCA1*, BRCA2*, BRIP1*, CDC73*, CDH1*, CDK4, CDKN1B*, CDKN2A, CHEK2*, CTNNA1*, DICER1*, EPCAM (del/dup only), EGFR, FH*, FLCN*, GREM1 (promoter dup only), HOXB13, KIT, LZTR1, MAX*, MBD4, MEN1*, MET, MITF, MLH1*, MSH2*, MSH3*, MSH6*, MUTYH*, NF1*, NF2*, NTHL1*, PALB2*, PDGFRA, PMS2*, POLD1*, POLE*, POT1*, PRKAR1A*, PTCH1*, PTEN*, RAD51C*, RAD51D*, RB1*, RET, SDHA* (sequencing only), SDHAF2*, SDHB*, SDHC*, SDHD*, SMAD4*, SMARCA4*, SMARCB1*, SMARCE1*, STK11*, SUFU*, TMEM127*, TP53*,  TSC1*, TSC2*, VHL*. RNA analysis is performed for * genes.   06/05/2022 -  Chemotherapy   Patient is on Treatment Plan : BREAST  Docetaxel + Carboplatin + Trastuzumab + Pertuzumab  (TCHP) q21d        FAMILY HISTORY:  We obtained a detailed, 4-generation family history.  Significant diagnoses are listed below:      Family History  Problem Relation Age of Onset   Kidney cancer Mother          dx after 44; early stage   Colon polyps Father     Lung cancer Father 52        smoking hx   Colon polyps Sister     Cancer Maternal Uncle          ? blood cancer; dx after 10   Cancer Maternal Grandmother          ? gallbladder ? colon; d. after 18       Ms. Waara is unaware of previous family history of genetic testing for hereditary cancer risks.  There is no reported Ashkenazi Jewish ancestry. There is no known consanguinity.    GENETIC TEST RESULTS:  The [facility] Multi-Cancer +RNA Panel found no pathogenic mutations.   The Multi-Cancer + RNA Panel offered by [facility] includes sequencing and/or deletion/duplication analysis of the following 70 genes:  AIP*, ALK, APC*, ATM*, AXIN2*, BAP1*, BARD1*, BLM*, BMPR1A*, BRCA1*, BRCA2*, BRIP1*, CDC73*, CDH1*, CDK4, CDKN1B*, CDKN2A, CHEK2*, CTNNA1*, DICER1*, EPCAM (del/dup only), EGFR, FH*, FLCN*, GREM1 (promoter dup only), HOXB13, KIT, LZTR1, MAX*, MBD4, MEN1*, MET, MITF, MLH1*, MSH2*, MSH3*, MSH6*, MUTYH*, NF1*, NF2*, NTHL1*, PALB2*, PDGFRA, PMS2*, POLD1*, POLE*, POT1*, PRKAR1A*, PTCH1*, PTEN*, RAD51C*, RAD51D*, RB1*, RET, SDHA* (sequencing only), SDHAF2*, SDHB*, SDHC*, SDHD*, SMAD4*, SMARCA4*, SMARCB1*, SMARCE1*, STK11*, SUFU*, [id number]*,  TP53*, TSC1*, TSC2*, VHL*. RNA analysis is performed for * genes.  The test report has been scanned into EPIC and is located under the Molecular Pathology section of the Results Review tab.  A portion of the result report is included below for reference. Genetic testing reported out on May 29, 2022.        Even though a pathogenic variant was not identified, possible explanations for the cancer in the family may include: There may be no hereditary risk for cancer in the family. The cancers in Ms. Amparo and/or her family may be sporadic/familial or due to other genetic and environmental factors. There may be a gene mutation in one of these genes that current testing methods cannot detect but that chance is small. There could be another gene that has not yet been discovered, or that we have not yet tested, that is responsible for the cancer diagnoses in the family.  It is also possible there is a hereditary cause for the cancer in the family that Ms. Diem did not inherit.  Therefore, it is important to remain in touch with cancer genetics in the future so that we can continue to offer Ms. Yanda the most up to date genetic testing.     ADDITIONAL GENETIC TESTING:  We discussed with Ms. Hudon that her genetic testing was fairly extensive.  If there are additional relevant genes identified to increase cancer risk that can be analyzed in the future, we would be happy to discuss and coordinate this testing at that time.    CANCER SCREENING RECOMMENDATIONS:  Ms. Safko test result is considered negative (normal).  This means that we have not identified a hereditary cause for her personal history of breast cancer at this time.   An individual's cancer risk and medical management are not determined by genetic test results alone. Overall cancer risk assessment incorporates additional factors, including personal medical history, family history, and any available genetic information that may result in a personalized plan for cancer prevention and surveillance. Therefore, it is recommended she continue to follow the cancer management and screening guidelines provided by her oncology and primary healthcare provider.  RECOMMENDATIONS FOR FAMILY MEMBERS:   Individuals in this family might be at  some increased risk of developing cancer, over the general population risk, due to the family history of cancer.  Individuals in the family should notify their providers of the family history of cancer. We recommend women in this family have a yearly mammogram beginning at age 22, or 54 years younger than the earliest onset of cancer, an annual clinical breast exam, and perform monthly breast self-exams.     FOLLOW-UP:  Lastly, we discussed with Ms. Redmon that cancer genetics is a rapidly advancing field and it is possible that new genetic tests will be appropriate for her and/or her family members in the future. We encouraged her to remain in contact with cancer genetics on an annual basis so we can update her personal and family histories and let her know of advances in cancer genetics that may benefit this family.   Our contact number was provided. Ms. Yao questions were answered to her satisfaction, and she knows she is welcome to call us at anytime with additional questions or concerns.   Khyri Hinzman M. Joette Catching, [place], [facility] Genetic Counselor [email or web address] (P) [phone]

## 2022-06-02 DIAGNOSIS — Z171 Estrogen receptor negative status [ER-]: Secondary | ICD-10-CM

## 2022-06-02 NOTE — Research (Signed)
S2205, ICE COMPRESS: RANDOMIZED TRIAL OF LIMB CRYOCOMPRESSION VERSUS CONTINUOUS COMPRESSION VERSUS LOW CYCLIC COMPRESSION FOR THE PREVENTION  OF TAXANE-INDUCED PERIPHERAL NEUROPATHY  Rec'd email from patient. She would prefer to continue with the plan that is already in place and does not wish to enroll in S2205 at this time.  Marjie Skiff Airyonna Franklyn, RN, BSN, [facility] She  Her  Hers Clinical Research Nurse [facility] Direct Dial [phone]  Pager [phone] 06/02/2022 8:45 AM

## 2022-06-03 ENCOUNTER — Inpatient Hospital Stay (HOSPITAL_BASED_OUTPATIENT_CLINIC_OR_DEPARTMENT_OTHER): Payer: 59 | Admitting: Adult Health

## 2022-06-03 ENCOUNTER — Encounter: Payer: Self-pay | Admitting: Adult Health

## 2022-06-03 ENCOUNTER — Inpatient Hospital Stay: Payer: 59

## 2022-06-03 VITALS — BP 117/76 | HR 69 | Temp 98.3°F | Resp 16 | Ht 68.0 in | Wt 181.3 lb

## 2022-06-03 DIAGNOSIS — Z5112 Encounter for antineoplastic immunotherapy: Secondary | ICD-10-CM | POA: Diagnosis not present

## 2022-06-03 DIAGNOSIS — C50412 Malignant neoplasm of upper-outer quadrant of left female breast: Secondary | ICD-10-CM

## 2022-06-03 DIAGNOSIS — Z95828 Presence of other vascular implants and grafts: Secondary | ICD-10-CM

## 2022-06-03 DIAGNOSIS — Z171 Estrogen receptor negative status [ER-]: Secondary | ICD-10-CM | POA: Diagnosis not present

## 2022-06-03 HISTORY — DX: Presence of other vascular implants and grafts: Z95.828

## 2022-06-03 LAB — CBC WITH DIFFERENTIAL (CANCER CENTER ONLY)
Abs Immature Granulocytes: 0.02 10*3/uL (ref 0.00–0.07)
Basophils Absolute: 0 10*3/uL (ref 0.0–0.1)
Basophils Relative: 0 %
Eosinophils Absolute: 0.2 10*3/uL (ref 0.0–0.5)
Eosinophils Relative: 3 %
HCT: 39.1 % (ref 36.0–46.0)
Hemoglobin: 13 g/dL (ref 12.0–15.0)
Immature Granulocytes: 0 %
Lymphocytes Relative: 31 %
Lymphs Abs: 2.3 10*3/uL (ref 0.7–4.0)
MCH: 31.9 pg (ref 26.0–34.0)
MCHC: 33.2 g/dL (ref 30.0–36.0)
MCV: 95.8 fL (ref 80.0–100.0)
Monocytes Absolute: 0.6 10*3/uL (ref 0.1–1.0)
Monocytes Relative: 8 %
Neutro Abs: 4.3 10*3/uL (ref 1.7–7.7)
Neutrophils Relative %: 58 %
Platelet Count: 238 10*3/uL (ref 150–400)
RBC: 4.08 MIL/uL (ref 3.87–5.11)
RDW: 13 % (ref 11.5–15.5)
WBC Count: 7.5 10*3/uL (ref 4.0–10.5)
nRBC: 0 % (ref 0.0–0.2)

## 2022-06-03 LAB — CMP (CANCER CENTER ONLY)
ALT: 30 U/L (ref 0–44)
AST: 23 U/L (ref 15–41)
Albumin: 4.4 g/dL (ref 3.5–5.0)
Alkaline Phosphatase: 79 U/L (ref 38–126)
Anion gap: 7 (ref 5–15)
BUN: 13 mg/dL (ref 6–20)
CO2: 28 mmol/L (ref 22–32)
Calcium: 9.2 mg/dL (ref 8.9–10.3)
Chloride: 105 mmol/L (ref 98–111)
Creatinine: 0.74 mg/dL (ref 0.44–1.00)
GFR, Estimated: >60 mL/min (ref >60)
Glucose, Bld: 107 mg/dL — ABNORMAL HIGH (ref 70–99)
Potassium: 4.5 mmol/L (ref 3.5–5.1)
Sodium: 140 mmol/L (ref 135–145)
Total Bilirubin: 0.4 mg/dL (ref 0.3–1.2)
Total Protein: 7 g/dL (ref 6.5–8.1)

## 2022-06-03 MED ORDER — SODIUM CHLORIDE 0.9% FLUSH
10.0000 mL | Freq: Once | INTRAVENOUS | Status: AC
  Administered 2022-06-03: 10 mL

## 2022-06-03 MED ORDER — LORAZEPAM 0.5 MG PO TABS: 0.5000 mg | ORAL_TABLET | Freq: Every day | ORAL | 0 refills | Status: DC | PRN

## 2022-06-03 NOTE — Progress Notes (Signed)
[facility] Follow up:    Erika Stalker, PA-C [place] [address]   DIAGNOSIS: Cancer Staging  Malignant neoplasm of upper-outer quadrant of left breast in female, estrogen receptor negative ([place]) Staging form: Breast, AJCC 8th Edition - Clinical stage from 05/21/2022: Stage IIB (cT2, cN1, cM0, G3, ER-, PR-, HER2+) - Signed by Nicholas Lose, MD on 05/21/2022 Stage prefix: Initial diagnosis Histologic grading system: 3 grade system   SUMMARY OF ONCOLOGIC HISTORY: Oncology History  Malignant neoplasm of upper-outer quadrant of left breast in female, estrogen receptor negative ([place])  05/15/2022 Initial Diagnosis   Palpable mass in the left breast at 1 o'clock position measured 4.2 cm ultrasound-guided biopsy revealed grade 3 IDC ER/PR negative HER2 positive Ki-67 35%, left axillary lymph node: Biopsy positive   05/21/2022 Cancer Staging   Staging form: Breast, AJCC 8th Edition - Clinical stage from 05/21/2022: Stage IIB (cT2, cN1, cM0, G3, ER-, PR-, HER2+) - Signed by Nicholas Lose, MD on 05/21/2022 Stage prefix: Initial diagnosis Histologic grading system: 3 grade system   05/29/2022 Genetic Testing   Negative [facility] Multi-Cancer +RNA Panel.  Report date is 05/29/2022.   The Multi-Cancer + RNA Panel offered by [facility] includes sequencing and/or deletion/duplication analysis of the following 70 genes:  AIP*, ALK, APC*, ATM*, AXIN2*, BAP1*, BARD1*, BLM*, BMPR1A*, BRCA1*, BRCA2*, BRIP1*, CDC73*, CDH1*, CDK4, CDKN1B*, CDKN2A, CHEK2*, CTNNA1*, DICER1*, EPCAM (del/dup only), EGFR, FH*, FLCN*, GREM1 (promoter dup only), HOXB13, KIT, LZTR1, MAX*, MBD4, MEN1*, MET, MITF, MLH1*, MSH2*, MSH3*, MSH6*, MUTYH*, NF1*, NF2*, NTHL1*, PALB2*, PDGFRA, PMS2*, POLD1*, POLE*, POT1*, PRKAR1A*, PTCH1*, PTEN*, RAD51C*, RAD51D*, RB1*, RET, SDHA* (sequencing only), SDHAF2*, SDHB*, SDHC*, SDHD*, SMAD4*, SMARCA4*, SMARCB1*, SMARCE1*, STK11*, SUFU*, TMEM127*, TP53*, TSC1*, TSC2*,  VHL*. RNA analysis is performed for * genes.   06/05/2022 -  Chemotherapy   Patient is on Treatment Plan : BREAST  Docetaxel + Carboplatin + Trastuzumab + Pertuzumab  (TCHP) q21d        CURRENT THERAPY: TCHP  INTERVAL HISTORY: Erika [name] 62 y.o. female returns for    Patient Active Problem List   Diagnosis Date Noted  . Port-A-Cath in place 06/03/2022  . Genetic testing 05/30/2022  . Therapeutic drug monitoring 05/27/2022  . Malignant neoplasm of upper-outer quadrant of left breast in female, estrogen receptor negative ([place]) 05/19/2022    is allergic to penicillins.  MEDICAL HISTORY: Past Medical History:  Diagnosis Date  . Anxiety   . Colitis 04/07/2008   seen on CT (at ER visit)  . Depression   . GERD (gastroesophageal reflux disease)   . Hyperplastic colon polyp   . Hypertension   . Liver hemangioma 04/07/2008   seen on CT    SURGICAL HISTORY: Past Surgical History:  Procedure Laterality Date  . "freezing of cervix"    . ABDOMINAL HYSTERECTOMY    . BREAST BIOPSY Left 05/15/2022   [place] LT BREAST BX W LOC DEV 1ST LESION IMG BX SPEC US GUIDE 05/15/2022 [facility]AMMOGRAPHY  . CESAREAN SECTION    . NASAL SINUS SURGERY    . PORTACATH PLACEMENT N/A 05/29/2022   Procedure: INSERTION PORT-A-CATH;  Surgeon: Stark Klein, MD;  Location: [facility];  Service: General;  Laterality: N/A;  . right kneecap surgery +      SOCIAL HISTORY: Social History   Socioeconomic History  . Marital status: Married    Spouse name: Not on file  . Number of children: 0  . Years of education: Not on file  . Highest  education level: Not on file  Occupational History  . Occupation: [employer]  Tobacco Use  . Smoking status: Never  . Smokeless tobacco: Never  . Tobacco comments:    smoked 1 year while in college  Vaping Use  . Vaping Use: Never used  Substance and Sexual Activity  . Alcohol use: Yes    Alcohol/week: 14.0 standard drinks of alcohol    Types: 14 Glasses of wine per  week    Comment: wine - daily  . Drug use: No  . Sexual activity: Not on file  Other Topics Concern  . Not on file  Social History Narrative   1 caffeine drinks daily    Social Determinants of Health   Financial Resource Strain: Not on file  Food Insecurity: Not on file  Transportation Needs: Not on file  Physical Activity: Not on file  Stress: Not on file  Social Connections: Not on file  Intimate Partner Violence: Not on file    FAMILY HISTORY: Family History  Problem Relation Age of Onset  . Kidney cancer Mother        dx after 56; early stage  . Hypertension Father   . Heart disease Father   . Colon polyps Father   . Lung cancer Father 72       smoking hx  . Colon polyps Sister   . Hypertension Brother   . Cancer Maternal Uncle        ? blood cancer; dx after 50  . Cancer Maternal Grandmother        ? gallbladder ? colon; d. after 49  . Diabetes Paternal Grandmother     Review of Systems - Oncology    PHYSICAL EXAMINATION  ECOG PERFORMANCE STATUS: {CHL ONC ECOG [id number]  There were no vitals filed for this visit.  Physical Exam  LABORATORY DATA:  CBC    Component Value Date/Time   WBC 5.8 05/21/2022 1210   WBC 13.5 (H) 01/21/2009 0826   RBC 3.94 05/21/2022 1210   HGB 12.6 05/21/2022 1210   HCT 37.6 05/21/2022 1210   PLT 227 05/21/2022 1210   MCV 95.4 05/21/2022 1210   MCH 32.0 05/21/2022 1210   MCHC 33.5 05/21/2022 1210   RDW 13.1 05/21/2022 1210   LYMPHSABS 2.0 05/21/2022 1210   MONOABS 0.3 05/21/2022 1210   EOSABS 0.2 05/21/2022 1210   BASOSABS 0.0 05/21/2022 1210    CMP     Component Value Date/Time   NA 138 05/21/2022 1210   K 4.1 05/21/2022 1210   CL 105 05/21/2022 1210   CO2 27 05/21/2022 1210   GLUCOSE 124 (H) 05/21/2022 1210   BUN 11 05/21/2022 1210   CREATININE 0.76 05/21/2022 1210   CALCIUM 9.1 05/21/2022 1210   PROT 6.7 05/21/2022 1210   ALBUMIN 4.2 05/21/2022 1210   AST 30 05/21/2022 1210   ALT 27 05/21/2022  1210   ALKPHOS 75 05/21/2022 1210   BILITOT 0.4 05/21/2022 1210   GFRNONAA >60 05/21/2022 1210   GFRAA  01/21/2009 0826    >60        The eGFR has been calculated using the MDRD equation. This calculation has not been validated in all clinical situations. eGFR's persistently <60 mL/min signify possible Chronic Kidney Disease.       PENDING LABS:   RADIOGRAPHIC STUDIES:  No results found.   PATHOLOGY:     ASSESSMENT and THERAPY PLAN:   No problem-specific Assessment & Plan notes found for this encounter.   No  orders of the defined types were placed in this encounter.   All questions were answered. The patient knows to call the clinic with any problems, questions or concerns. We can certainly see the patient much sooner if necessary. This note was electronically signed. Scot Dock, NP 06/03/2022

## 2022-06-04 ENCOUNTER — Encounter: Payer: Self-pay | Admitting: Hematology and Oncology

## 2022-06-04 MED FILL — Fosaprepitant Dimeglumine For IV Infusion 150 MG (Base Eq): INTRAVENOUS | Qty: 5 | Status: AC

## 2022-06-04 MED FILL — Dexamethasone Sodium Phosphate Inj 100 MG/10ML: INTRAMUSCULAR | Qty: 1 | Status: AC

## 2022-06-04 NOTE — Progress Notes (Signed)
Called pt to introduce myself as her Arboriculturist and to discuss the [employer].  I went over what it covers but she declined it at this time.  I will request for the registration staff leave her my card in case she changes her mind and for any questions or concerns she may have in the future.

## 2022-06-05 ENCOUNTER — Inpatient Hospital Stay: Payer: 59

## 2022-06-05 ENCOUNTER — Encounter: Payer: Self-pay | Admitting: Hematology and Oncology

## 2022-06-05 VITALS — BP 149/93 | HR 65 | Temp 97.5°F | Resp 18

## 2022-06-05 DIAGNOSIS — Z5112 Encounter for antineoplastic immunotherapy: Secondary | ICD-10-CM | POA: Diagnosis not present

## 2022-06-05 DIAGNOSIS — C50412 Malignant neoplasm of upper-outer quadrant of left female breast: Secondary | ICD-10-CM

## 2022-06-05 DIAGNOSIS — Z171 Estrogen receptor negative status [ER-]: Secondary | ICD-10-CM

## 2022-06-05 MED ORDER — TRASTUZUMAB-ANNS CHEMO 150 MG IV SOLR
8.0000 mg/kg | Freq: Once | INTRAVENOUS | Status: AC
  Administered 2022-06-05: 672 mg via INTRAVENOUS
  Filled 2022-06-05: qty 32

## 2022-06-05 MED ORDER — DIPHENHYDRAMINE HCL 25 MG PO CAPS
50.0000 mg | ORAL_CAPSULE | Freq: Once | ORAL | Status: AC
  Administered 2022-06-05: 50 mg via ORAL
  Filled 2022-06-05: qty 2

## 2022-06-05 MED ORDER — SODIUM CHLORIDE 0.9 % IV SOLN
75.0000 mg/m2 | Freq: Once | INTRAVENOUS | Status: AC
  Administered 2022-06-05: 160 mg via INTRAVENOUS
  Filled 2022-06-05: qty 16

## 2022-06-05 MED ORDER — ACETAMINOPHEN 325 MG PO TABS
650.0000 mg | ORAL_TABLET | Freq: Once | ORAL | Status: AC
  Administered 2022-06-05: 650 mg via ORAL
  Filled 2022-06-05: qty 2

## 2022-06-05 MED ORDER — SODIUM CHLORIDE 0.9 % IV SOLN
739.2000 mg | Freq: Once | INTRAVENOUS | Status: AC
  Administered 2022-06-05: 750 mg via INTRAVENOUS
  Filled 2022-06-05: qty 75

## 2022-06-05 MED ORDER — LORAZEPAM 2 MG/ML IJ SOLN
0.5000 mg | Freq: Once | INTRAMUSCULAR | Status: AC
  Administered 2022-06-05: 0.5 mg via INTRAVENOUS
  Filled 2022-06-05: qty 1

## 2022-06-05 MED ORDER — SODIUM CHLORIDE 0.9% FLUSH
10.0000 mL | INTRAVENOUS | Status: DC | PRN
  Administered 2022-06-05: 10 mL

## 2022-06-05 MED ORDER — SODIUM CHLORIDE 0.9 % IV SOLN: Freq: Once | INTRAVENOUS | Status: AC

## 2022-06-05 MED ORDER — SODIUM CHLORIDE 0.9 % IV SOLN
840.0000 mg | Freq: Once | INTRAVENOUS | Status: AC
  Administered 2022-06-05: 840 mg via INTRAVENOUS
  Filled 2022-06-05: qty 28

## 2022-06-05 MED ORDER — PALONOSETRON HCL INJECTION 0.25 MG/5ML
0.2500 mg | Freq: Once | INTRAVENOUS | Status: AC
  Administered 2022-06-05: 0.25 mg via INTRAVENOUS
  Filled 2022-06-05: qty 5

## 2022-06-05 MED ORDER — SODIUM CHLORIDE 0.9 % IV SOLN
10.0000 mg | Freq: Once | INTRAVENOUS | Status: AC
  Administered 2022-06-05: 10 mg via INTRAVENOUS
  Filled 2022-06-05: qty 10

## 2022-06-05 MED ORDER — SODIUM CHLORIDE 0.9 % IV SOLN
150.0000 mg | Freq: Once | INTRAVENOUS | Status: AC
  Administered 2022-06-05: 150 mg via INTRAVENOUS
  Filled 2022-06-05: qty 150

## 2022-06-05 MED ORDER — HEPARIN SOD (PORK) LOCK FLUSH 100 UNIT/ML IV SOLN
500.0000 [IU] | Freq: Once | INTRAVENOUS | Status: AC | PRN
  Administered 2022-06-05: 500 [IU]

## 2022-06-05 NOTE — Assessment & Plan Note (Signed)
Erika [name] is a 62 year old woman with stage IIb HER2 positive breast cancer here today for evaluation and follow-up prior to receiving Taxotere carboplatin Herceptin and Perjeta on Thursday, February 29.  I spent about 30 minutes with Magda Paganini and her mom today reviewing her breast cancer treatment antinausea medicine and potential side effects and different parts of the regimen.  We reviewed her overall plan and discussed what she might expect especially with using the Digna cap.  I suggested Mykira keep diary over the next week of her side effects so she can keep track of them to review with Dr. Lindi Adie at her appointment with him in 1 week.  She will return for follow-up with Dr. Lindi Adie 1 week after she receives her treatment.

## 2022-06-05 NOTE — Patient Instructions (Addendum)
[facility]  Discharge Instructions: Thank you for choosing [facility] to provide your oncology and hematology care.   If you have a lab appointment with the [facility], please go directly to the [facility] and check in at the registration area.   Wear comfortable clothing and clothing appropriate for easy access to any Portacath or PICC line.   We strive to give you quality time with your provider. You may need to reschedule your appointment if you arrive late (15 or more minutes).  Arriving late affects you and other patients whose appointments are after yours.  Also, if you miss three or more appointments without notifying the office, you may be dismissed from the clinic at the provider's discretion.      For prescription refill requests, have your pharmacy contact our office and allow 72 hours for refills to be completed.    Today you received the following chemotherapy and/or immunotherapy agents: Trastuzumab (Kanjinti), Pertuzumab (Perjeta),Taxotere (Docetaxel), Carboplatin (Paraplatin)   To help prevent nausea and vomiting after your treatment, we encourage you to take your nausea medication as directed.  BELOW ARE SYMPTOMS THAT SHOULD BE REPORTED IMMEDIATELY: *FEVER GREATER THAN 100.4 F (38 C) OR HIGHER *CHILLS OR SWEATING *NAUSEA AND VOMITING THAT IS NOT CONTROLLED WITH YOUR NAUSEA MEDICATION *UNUSUAL SHORTNESS OF BREATH *UNUSUAL BRUISING OR BLEEDING *URINARY PROBLEMS (pain or burning when urinating, or frequent urination) *BOWEL PROBLEMS (unusual diarrhea, constipation, pain near the anus) TENDERNESS IN MOUTH AND THROAT WITH OR WITHOUT PRESENCE OF ULCERS (sore throat, sores in mouth, or a toothache) UNUSUAL RASH, SWELLING OR PAIN  UNUSUAL VAGINAL DISCHARGE OR ITCHING   Items with * indicate a potential emergency and should be followed up as soon as possible or go to the Emergency Department if any problems should  occur.  Please show the CHEMOTHERAPY ALERT CARD or IMMUNOTHERAPY ALERT CARD at check-in to the Emergency Department and triage nurse.  Should you have questions after your visit or need to cancel or reschedule your appointment, please contact [facility]  Dept: [phone]  and follow the prompts.  Office hours are 8:00 a.m. to 4:30 p.m. Monday - Friday. Please note that voicemails left after 4:00 p.m. may not be returned until the following business day.  We are closed weekends and major holidays. You have access to a nurse at all times for urgent questions. Please call the main number to the clinic Dept: [phone] and follow the prompts.   For any non-urgent questions, you may also contact your provider using MyChart. We now offer e-Visits for anyone 21 and older to request care online for non-urgent symptoms. For details visit [email or web address].   Also download the MyChart app! Go to the app store, search "MyChart", open the app, select [facility], and log in with your MyChart username and password.

## 2022-06-06 ENCOUNTER — Encounter: Payer: Self-pay | Admitting: Hematology and Oncology

## 2022-06-06 ENCOUNTER — Telehealth: Payer: Self-pay | Admitting: *Deleted

## 2022-06-06 NOTE — Telephone Encounter (Signed)
RN placed call to pt for chemo follow up.  Pt states she is doing well with no side effects at this time.  RN encouraged pt to increase p.o intake and reviewed Udencya injection.  Pt educated and verbalized understanding.

## 2022-06-06 NOTE — Research (Signed)
Trial: [id number]: Use of a Clinical Trial Screening Tool to Address Cancer Health Disparities in the [facility] [place])  Patient Erika [name] was identified by this RN as a potential candidate for the above listed study.  This Clinical Research Nurse met with Erika [name], [id number], on 06/06/22 in a manner and location that ensures patient privacy to discuss participation in the above listed research study.  Patient is Accompanied by her husband .  A copy of the informed consent document and separate HIPAA Authorization was provided to the patient.  Patient reads, speaks, and understands [place].    Patient was provided with the business card of this Nurse and encouraged to contact the research team with any questions.  Patient was provided the option of taking informed consent documents home to review and was encouraged to review at their convenience with their support network, including other care providers. Patient is comfortable with making a decision regarding study participation today.  As outlined in the informed consent form, this Nurse and Erika [name] discussed the purpose of the research study, the investigational nature of the study, study procedures and requirements for study participation, potential risks and benefits of study participation, as well as alternatives to participation. This study is not blinded. The patient understands participation is voluntary and they may withdraw from study participation at any time.  This study does not involve randomization.  This study does not involve an investigational drug or device. This study does not involve a placebo. Patient understands enrollment is pending full eligibility review.   Confidentiality and how the patient's information will be used as part of study participation were discussed.  Patient was informed there is not reimbursement provided for their time and effort spent on trial participation.  The  patient is encouraged to discuss research study participation with their insurance provider to determine what costs they may incur as part of study participation, including research related injury.    All questions were answered to patient's satisfaction.  The informed consent and separate HIPAA Authorization was reviewed page by page.  The patient's mental and emotional status is appropriate to provide informed consent, and the patient verbalizes an understanding of study participation.  Patient has agreed to participate in the above listed research study and has voluntarily signed the informed consent active date 11/22/21 and separate HIPAA Authorization, version 15  on 06/06/22 at 1358PM.  The patient was provided with a copy of the signed informed consent form and separate HIPAA Authorization for their reference.  No study specific procedures were obtained prior to the signing of the informed consent document.  Approximately 15 minutes were spent with the patient reviewing the informed consent documents.  Patient was not requested to complete a Release of Information form.   This Nurse has reviewed this patient's inclusion and exclusion criteria and confirmed Erika [name] is eligible for study participation.  Patient will continue with enrollment.  Eligibility confirmed by treating investigator, who also agrees that patient should proceed with enrollment.  Erika Skiff Devetta Hagenow, RN, BSN, [facility]  Her  [facility] Clinical Research Nurse [facility] Direct Dial [phone]  Pager [phone] 06/06/2022 9:30 AM

## 2022-06-07 ENCOUNTER — Inpatient Hospital Stay: Payer: 59 | Attending: Hematology and Oncology

## 2022-06-07 VITALS — BP 129/84 | HR 75 | Temp 97.7°F | Resp 16

## 2022-06-07 DIAGNOSIS — Z5112 Encounter for antineoplastic immunotherapy: Secondary | ICD-10-CM | POA: Insufficient documentation

## 2022-06-07 DIAGNOSIS — Z79899 Other long term (current) drug therapy: Secondary | ICD-10-CM | POA: Insufficient documentation

## 2022-06-07 DIAGNOSIS — Z171 Estrogen receptor negative status [ER-]: Secondary | ICD-10-CM | POA: Insufficient documentation

## 2022-06-07 DIAGNOSIS — T451X5A Adverse effect of antineoplastic and immunosuppressive drugs, initial encounter: Secondary | ICD-10-CM | POA: Diagnosis not present

## 2022-06-07 DIAGNOSIS — L270 Generalized skin eruption due to drugs and medicaments taken internally: Secondary | ICD-10-CM | POA: Diagnosis not present

## 2022-06-07 DIAGNOSIS — Z8051 Family history of malignant neoplasm of kidney: Secondary | ICD-10-CM | POA: Diagnosis not present

## 2022-06-07 DIAGNOSIS — R21 Rash and other nonspecific skin eruption: Secondary | ICD-10-CM | POA: Insufficient documentation

## 2022-06-07 DIAGNOSIS — Z801 Family history of malignant neoplasm of trachea, bronchus and lung: Secondary | ICD-10-CM | POA: Diagnosis not present

## 2022-06-07 DIAGNOSIS — Z5111 Encounter for antineoplastic chemotherapy: Secondary | ICD-10-CM | POA: Diagnosis present

## 2022-06-07 DIAGNOSIS — Z7982 Long term (current) use of aspirin: Secondary | ICD-10-CM | POA: Diagnosis not present

## 2022-06-07 DIAGNOSIS — C50412 Malignant neoplasm of upper-outer quadrant of left female breast: Secondary | ICD-10-CM

## 2022-06-07 DIAGNOSIS — I10 Essential (primary) hypertension: Secondary | ICD-10-CM | POA: Diagnosis not present

## 2022-06-07 DIAGNOSIS — Z90710 Acquired absence of both cervix and uterus: Secondary | ICD-10-CM | POA: Diagnosis not present

## 2022-06-07 DIAGNOSIS — Z87891 Personal history of nicotine dependence: Secondary | ICD-10-CM | POA: Diagnosis not present

## 2022-06-07 DIAGNOSIS — R197 Diarrhea, unspecified: Secondary | ICD-10-CM | POA: Diagnosis not present

## 2022-06-07 DIAGNOSIS — Z5189 Encounter for other specified aftercare: Secondary | ICD-10-CM | POA: Diagnosis not present

## 2022-06-07 MED ORDER — PEGFILGRASTIM-CBQV 6 MG/0.6ML ~~LOC~~ SOSY
6.0000 mg | PREFILLED_SYRINGE | Freq: Once | SUBCUTANEOUS | Status: AC
  Administered 2022-06-07: 6 mg via SUBCUTANEOUS
  Filled 2022-06-07: qty 0.6

## 2022-06-09 ENCOUNTER — Telehealth: Payer: Self-pay | Admitting: *Deleted

## 2022-06-09 ENCOUNTER — Inpatient Hospital Stay (HOSPITAL_BASED_OUTPATIENT_CLINIC_OR_DEPARTMENT_OTHER): Payer: 59 | Admitting: Physician Assistant

## 2022-06-09 VITALS — BP 116/76 | HR 86 | Temp 98.5°F | Resp 16 | Wt 182.0 lb

## 2022-06-09 DIAGNOSIS — Z5112 Encounter for antineoplastic immunotherapy: Secondary | ICD-10-CM | POA: Diagnosis not present

## 2022-06-09 DIAGNOSIS — C50412 Malignant neoplasm of upper-outer quadrant of left female breast: Secondary | ICD-10-CM

## 2022-06-09 DIAGNOSIS — L309 Dermatitis, unspecified: Secondary | ICD-10-CM | POA: Diagnosis not present

## 2022-06-09 DIAGNOSIS — Z171 Estrogen receptor negative status [ER-]: Secondary | ICD-10-CM | POA: Diagnosis not present

## 2022-06-09 MED ORDER — TRIAMCINOLONE ACETONIDE 0.5 % EX OINT: 1.0000 | TOPICAL_OINTMENT | Freq: Two times a day (BID) | CUTANEOUS | 0 refills | Status: DC

## 2022-06-09 NOTE — Telephone Encounter (Signed)
Received call from pt with complaint of erythematous and pruritus rash on her forearm and buttocks that is not alleviated by OTC anti itch cream. Per MD pt needing to be seen by [facility] for further evaluation.  Appt scheduled, pt notified.

## 2022-06-09 NOTE — Progress Notes (Signed)
Symptom Management Consult Note [place]    Patient Care Team: Marda Stalker, PA-C as PCP - General (Family Medicine) Stark Klein, MD as Consulting Physician (General Surgery) Nicholas Lose, MD as Consulting Physician (Hematology and Oncology) Gery Pray, MD as Consulting Physician (Radiation Oncology) Rockwell Germany, RN as Oncology Nurse Navigator Mauro Kaufmann, RN as Oncology Nurse Navigator    Name / MRN / DOB: Erika [name]  [id number]  02/22/1961   Date of visit: 06/09/2022   Chief Complaint/Reason for visit: rash   Current Therapy: TCHP   Last treatment:  Day 1   Cycle 1 on 06/05/22   ASSESSMENT & PLAN: Patient is a 62 y.o. female  with oncologic history of Malignant neoplasm of upper-outer quadrant of left breast in female, estrogen receptor negative  followed by Dr. Lindi Adie.  I have viewed most recent oncology note and lab work.    #Malignant neoplasm of upper-outer quadrant of left breast in female, estrogen receptor negative  - Next appointment with oncologist is 06/12/22   #Rash - Rash on right arms is consistent with irritant contact dermatitis in a flexural area . Prescription sent to pharmacy for kenalog. - Patient deferred evaluation of her buttock. She is currently having diarrhea likely adverse effect of treatment that is being managed with Imodium. She started using scented wet wipes which could be causing the irritation. Patient will discontinue use.  Encouraged her to wear loose clothing. -Patient encouraged to return to clinic if rash does not improve for reassessment.       Heme/Onc History: Oncology History  Malignant neoplasm of upper-outer quadrant of left breast in female, estrogen receptor negative ([place])  05/15/2022 Initial Diagnosis   Palpable mass in the left breast at 1 o'clock position measured 4.2 cm ultrasound-guided biopsy revealed grade 3 IDC ER/PR negative HER2 positive Ki-67 35%, left axillary lymph  node: Biopsy positive   05/21/2022 Cancer Staging   Staging form: Breast, AJCC 8th Edition - Clinical stage from 05/21/2022: Stage IIB (cT2, cN1, cM0, G3, ER-, PR-, HER2+) - Signed by Nicholas Lose, MD on 05/21/2022 Stage prefix: Initial diagnosis Histologic grading system: 3 grade system   05/29/2022 Genetic Testing   Negative [facility] Multi-Cancer +RNA Panel.  Report date is 05/29/2022.   The Multi-Cancer + RNA Panel offered by [facility] includes sequencing and/or deletion/duplication analysis of the following 70 genes:  AIP*, ALK, APC*, ATM*, AXIN2*, BAP1*, BARD1*, BLM*, BMPR1A*, BRCA1*, BRCA2*, BRIP1*, CDC73*, CDH1*, CDK4, CDKN1B*, CDKN2A, CHEK2*, CTNNA1*, DICER1*, EPCAM (del/dup only), EGFR, FH*, FLCN*, GREM1 (promoter dup only), HOXB13, KIT, LZTR1, MAX*, MBD4, MEN1*, MET, MITF, MLH1*, MSH2*, MSH3*, MSH6*, MUTYH*, NF1*, NF2*, NTHL1*, PALB2*, PDGFRA, PMS2*, POLD1*, POLE*, POT1*, PRKAR1A*, PTCH1*, PTEN*, RAD51C*, RAD51D*, RB1*, RET, SDHA* (sequencing only), SDHAF2*, SDHB*, SDHC*, SDHD*, SMAD4*, SMARCA4*, SMARCB1*, SMARCE1*, STK11*, SUFU*, TMEM127*, TP53*, TSC1*, TSC2*, VHL*. RNA analysis is performed for * genes.   06/05/2022 -  Chemotherapy   Patient is on Treatment Plan : BREAST  Docetaxel + Carboplatin + Trastuzumab + Pertuzumab  (TCHP) q21d          Interval history-: Erika [name] is a 62 y.o. female with oncologic history as above presenting to [facility] today with chief complaint of rash x 2 days.  She presents unaccompanied to clinic.  Patient first noticed that her right forearm felt itchy. The next day she noticed there was some surrounding redness.  She has tried applying over-the-counter anti-itch gel with minimal symptom improvement.  She is unsure what  caused the rash.  It is near where she had a recent blood draw.  Patient denies history of sensitive skin or allergy to Band-Aids or tape. Rash has not grown in size since onset. Patient also reports a rash on her buttock.  She states it  also itches.  Patient admits to having diarrhea after her first chemotherapy.  She had 5 episodes yesterday of good yellow stool that was controlled with Imodium.  So far today she has only had 2 bowel movements.  When asked if patient has used any new medications or lotions she recalls purchasing fragmented wet wipes which she has just started using.  She thought with her frequency of bowel movements using wet wipes would be more gentle to the area.  She denies any urinary symptoms.  She has not had any fevers or chills.  No history of anyone in the home having similar rash.      ROS  All other systems are reviewed and are negative for acute change except as noted in the HPI.    Allergies  Allergen Reactions   Penicillins Nausea Only     Past Medical History:  Diagnosis Date   Anxiety    Colitis 04/07/2008   seen on CT (at ER visit)   Depression    GERD (gastroesophageal reflux disease)    Hyperplastic colon polyp    Hypertension    Liver hemangioma 04/07/2008   seen on CT     Past Surgical History:  Procedure Laterality Date   "freezing of cervix"     ABDOMINAL HYSTERECTOMY     BREAST BIOPSY Left 05/15/2022   [place] LT BREAST BX W LOC DEV 1ST LESION IMG BX [place] US GUIDE 05/15/2022 [facility] MAMMOGRAPHY   CESAREAN SECTION     NASAL SINUS SURGERY     PORTACATH PLACEMENT N/A 05/29/2022   Procedure: INSERTION PORT-A-CATH;  Surgeon: Stark Klein, MD;  Location: [facility];  Service: General;  Laterality: N/A;   right kneecap surgery +      Social History   Socioeconomic History   Marital status: Married    Spouse name: Not on file   Number of children: 0   Years of education: Not on file   Highest education level: Not on file  Occupational History   Occupation: [employer]  Tobacco Use   Smoking status: Never   Smokeless tobacco: Never   Tobacco comments:    smoked 1 year while in college  Vaping Use   Vaping Use: Never used  Substance and Sexual Activity   Alcohol use:  Yes    Alcohol/week: 14.0 standard drinks of alcohol    Types: 14 Glasses of wine per week    Comment: wine - daily   Drug use: No   Sexual activity: Not on file  Other Topics Concern   Not on file  Social History Narrative   1 caffeine drinks daily    Social Determinants of Health   Financial Resource Strain: Not on file  Food Insecurity: Not on file  Transportation Needs: Not on file  Physical Activity: Not on file  Stress: Not on file  Social Connections: Not on file  Intimate Partner Violence: Not on file    Family History  Problem Relation Age of Onset   Kidney cancer Mother        dx after 60; early stage   Hypertension Father    Heart disease Father    Colon polyps Father    Lung cancer Father 49  smoking hx   Colon polyps Sister    Hypertension Brother    Cancer Maternal Uncle        ? blood cancer; dx after 67   Cancer Maternal Grandmother        ? gallbladder ? colon; d. after 60   Diabetes Paternal Grandmother      Current Outpatient Medications:    triamcinolone ointment (KENALOG) 0.5 %, Apply 1 Application topically 2 (two) times daily., Disp: 30 g, Rfl: 0   aspirin EC 81 MG tablet, Take 81 mg by mouth daily., Disp: , Rfl:    dexamethasone (DECADRON) 4 MG tablet, Take 1 tablet (4 mg total) by mouth daily. Take 1 tablet day before chemo and 1 tablet day after chemo with food., Disp: 12 tablet, Rfl: 0   lidocaine-prilocaine (EMLA) cream, Apply to affected area once, Disp: 30 g, Rfl: 3   lisinopril (ZESTRIL) 20 MG tablet, Take 20 mg by mouth daily., Disp: , Rfl:    loratadine (CLARITIN) 10 MG tablet, Take 10 mg by mouth daily as needed for allergies., Disp: , Rfl:    LORazepam (ATIVAN) 0.5 MG tablet, Take 1 tablet (0.5 mg total) by mouth daily as needed for anxiety., Disp: 30 tablet, Rfl: 0   ondansetron (ZOFRAN) 8 MG tablet, Take 1 tablet (8 mg total) by mouth every 8 (eight) hours as needed for nausea or vomiting. Start on the third day after  chemotherapy., Disp: 30 tablet, Rfl: 1   oxyCODONE (OXY IR/ROXICODONE) 5 MG immediate release tablet, Take 1 tablet (5 mg total) by mouth every 6 (six) hours as needed for severe pain., Disp: 5 tablet, Rfl: 0   prochlorperazine (COMPAZINE) 10 MG tablet, Take 1 tablet (10 mg total) by mouth every 6 (six) hours as needed for nausea or vomiting., Disp: 30 tablet, Rfl: 1   rosuvastatin (CRESTOR) 20 MG tablet, Take 20 mg by mouth daily., Disp: , Rfl:    sertraline (ZOLOFT) 100 MG tablet, Take 100 mg by mouth daily., Disp: , Rfl:   PHYSICAL EXAM: ECOG FS:1 - Symptomatic but completely ambulatory    Vitals:   06/09/22 1505  BP: 116/76  Pulse: 86  Resp: 16  Temp: 98.5 F (36.9 C)  TempSrc: Oral  SpO2: 96%  Weight: 182 lb (82.6 kg)   Physical Exam Vitals and nursing note reviewed.  Constitutional:      Appearance: She is well-developed. She is not ill-appearing or toxic-appearing.  HENT:     Head: Normocephalic.     Nose: Nose normal.     Mouth/Throat:     Mouth: Mucous membranes are moist.  Eyes:     Conjunctiva/sclera: Conjunctivae normal.  Neck:     Vascular: No JVD.  Cardiovascular:     Rate and Rhythm: Normal rate and regular rhythm.     Pulses: Normal pulses.          Radial pulses are 2+ on the right side.     Heart sounds: Normal heart sounds.  Pulmonary:     Effort: Pulmonary effort is normal.     Breath sounds: Normal breath sounds.  Abdominal:     General: There is no distension.  Genitourinary:    Comments: Patient deferred evaluation of rash on buttock Musculoskeletal:     Cervical back: Normal range of motion.     Comments: Compartments soft in RUE.  Skin:    General: Skin is warm and dry.     Findings: Rash present.     Comments:  Approximately 6 cm area of confluent macular erythema on right AC in flexor crease. No open wound.  Neurological:     Mental Status: She is oriented to person, place, and time.        LABORATORY DATA: I have reviewed the  data as listed    Latest Ref Rng & Units 06/03/2022    1:32 PM 05/21/2022   12:10 PM 01/21/2009    8:26 AM  CBC  WBC 4.0 - 10.5 K/uL 7.5  5.8  13.5   Hemoglobin 12.0 - 15.0 g/dL 13.0  12.6  14.0   Hematocrit 36.0 - 46.0 % 39.1  37.6  41.0   Platelets 150 - 400 K/uL 238  227  295         Latest Ref Rng & Units 06/03/2022    1:32 PM 05/21/2022   12:10 PM 01/21/2009    8:26 AM  CMP  Glucose 70 - 99 mg/dL 107  124  118   BUN 6 - 20 mg/dL '13  11  24   '$ Creatinine 0.44 - 1.00 mg/dL 0.74  0.76  0.86   Sodium 135 - 145 mmol/L 140  138  137   Potassium 3.5 - 5.1 mmol/L 4.5  4.1  4.5   Chloride 98 - 111 mmol/L 105  105  101   CO2 22 - 32 mmol/L '28  27  24   '$ Calcium 8.9 - 10.3 mg/dL 9.2  9.1  10.0   Total Protein 6.5 - 8.1 g/dL 7.0  6.7  7.7   Total Bilirubin 0.3 - 1.2 mg/dL 0.4  0.4  1.3   Alkaline Phos 38 - 126 U/L 79  75  46   AST 15 - 41 U/L 23  30  32   ALT 0 - 44 U/L '30  27  23        '$ RADIOGRAPHIC STUDIES (from last 24 hours if applicable) I have personally reviewed the radiological images as listed and agreed with the findings in the report. No results found.      Visit Diagnosis: 1. Dermatitis   2. Malignant neoplasm of upper-outer quadrant of left breast in female, estrogen receptor negative ([place])      No orders of the defined types were placed in this encounter.   All questions were answered. The patient knows to call the clinic with any problems, questions or concerns. No barriers to learning was detected.  I have spent a total of 30 minutes minutes of face-to-face and non-face-to-face time, preparing to see the patient, obtaining and/or reviewing separately obtained history, performing a medically appropriate examination, counseling and educating the patient, ordering medications, documenting clinical information in the electronic health record, and care coordination.    Thank you for allowing me to participate in the care of this patient.    Barrie Folk, PA-C Department of Hematology/Oncology [facility] Phone: [phone]  [phone]    06/09/2022 4:01 PM

## 2022-06-10 NOTE — Progress Notes (Signed)
Patient Care Team: Marda Stalker, PA-C as PCP - General (Family Medicine) Stark Klein, MD as Consulting Physician (General Surgery) Nicholas Lose, MD as Consulting Physician (Hematology and Oncology) Gery Pray, MD as Consulting Physician (Radiation Oncology) Rockwell Germany, RN as Oncology Nurse Navigator Mauro Kaufmann, RN as Oncology Nurse Navigator  DIAGNOSIS: No diagnosis found.  SUMMARY OF ONCOLOGIC HISTORY: Oncology History  Malignant neoplasm of upper-outer quadrant of left breast in female, estrogen receptor negative ([place])  05/15/2022 Initial Diagnosis   Palpable mass in the left breast at 1 o'clock position measured 4.2 cm ultrasound-guided biopsy revealed grade 3 IDC ER/PR negative HER2 positive Ki-67 35%, left axillary lymph node: Biopsy positive   05/21/2022 Cancer Staging   Staging form: Breast, AJCC 8th Edition - Clinical stage from 05/21/2022: Stage IIB (cT2, cN1, cM0, G3, ER-, PR-, HER2+) - Signed by Nicholas Lose, MD on 05/21/2022 Stage prefix: Initial diagnosis Histologic grading system: 3 grade system   05/29/2022 Genetic Testing   Negative [facility] Multi-Cancer +RNA Panel.  Report date is 05/29/2022.   The Multi-Cancer + RNA Panel offered by [facility] includes sequencing and/or deletion/duplication analysis of the following 70 genes:  AIP*, ALK, APC*, ATM*, AXIN2*, BAP1*, BARD1*, BLM*, BMPR1A*, BRCA1*, BRCA2*, BRIP1*, CDC73*, CDH1*, CDK4, CDKN1B*, CDKN2A, CHEK2*, CTNNA1*, DICER1*, EPCAM (del/dup only), EGFR, FH*, FLCN*, GREM1 (promoter dup only), HOXB13, KIT, LZTR1, MAX*, MBD4, MEN1*, MET, MITF, MLH1*, MSH2*, MSH3*, MSH6*, MUTYH*, NF1*, NF2*, NTHL1*, PALB2*, PDGFRA, PMS2*, POLD1*, POLE*, POT1*, PRKAR1A*, PTCH1*, PTEN*, RAD51C*, RAD51D*, RB1*, RET, SDHA* (sequencing only), SDHAF2*, SDHB*, SDHC*, SDHD*, SMAD4*, SMARCA4*, SMARCB1*, SMARCE1*, STK11*, SUFU*, TMEM127*, TP53*, TSC1*, TSC2*, VHL*. RNA analysis is performed for * genes.   06/05/2022 -  Chemotherapy    Patient is on Treatment Plan : BREAST  Docetaxel + Carboplatin + Trastuzumab + Pertuzumab  (TCHP) q21d        CHIEF COMPLIANT:   INTERVAL HISTORY: EVAMARIA GAUDIO is a   ALLERGIES:  is allergic to penicillins.  MEDICATIONS:  Current Outpatient Medications  Medication Sig Dispense Refill   aspirin EC 81 MG tablet Take 81 mg by mouth daily.     dexamethasone (DECADRON) 4 MG tablet Take 1 tablet (4 mg total) by mouth daily. Take 1 tablet day before chemo and 1 tablet day after chemo with food. 12 tablet 0   lidocaine-prilocaine (EMLA) cream Apply to affected area once 30 g 3   lisinopril (ZESTRIL) 20 MG tablet Take 20 mg by mouth daily.     loratadine (CLARITIN) 10 MG tablet Take 10 mg by mouth daily as needed for allergies.     LORazepam (ATIVAN) 0.5 MG tablet Take 1 tablet (0.5 mg total) by mouth daily as needed for anxiety. 30 tablet 0   ondansetron (ZOFRAN) 8 MG tablet Take 1 tablet (8 mg total) by mouth every 8 (eight) hours as needed for nausea or vomiting. Start on the third day after chemotherapy. 30 tablet 1   oxyCODONE (OXY IR/ROXICODONE) 5 MG immediate release tablet Take 1 tablet (5 mg total) by mouth every 6 (six) hours as needed for severe pain. 5 tablet 0   prochlorperazine (COMPAZINE) 10 MG tablet Take 1 tablet (10 mg total) by mouth every 6 (six) hours as needed for nausea or vomiting. 30 tablet 1   rosuvastatin (CRESTOR) 20 MG tablet Take 20 mg by mouth daily.     sertraline (ZOLOFT) 100 MG tablet Take 100 mg by mouth daily.     triamcinolone ointment (KENALOG) 0.5 % Apply 1 Application  topically 2 (two) times daily. 30 g 0   No current facility-administered medications for this visit.    PHYSICAL EXAMINATION: ECOG PERFORMANCE STATUS: {CHL ONC ECOG PS:[phone]}  There were no vitals filed for this visit. There were no vitals filed for this visit.  BREAST:*** No palpable masses or nodules in either right or left breasts. No palpable axillary supraclavicular or  infraclavicular adenopathy no breast tenderness or nipple discharge. (exam performed in the presence of a chaperone)  LABORATORY DATA:  I have reviewed the data as listed    Latest Ref Rng & Units 06/03/2022    1:32 PM 05/21/2022   12:10 PM 01/21/2009    8:26 AM  CMP  Glucose 70 - 99 mg/dL 107  124  118   BUN 6 - 20 mg/dL '13  11  24   '$ Creatinine 0.44 - 1.00 mg/dL 0.74  0.76  0.86   Sodium 135 - 145 mmol/L 140  138  137   Potassium 3.5 - 5.1 mmol/L 4.5  4.1  4.5   Chloride 98 - 111 mmol/L 105  105  101   CO2 22 - 32 mmol/L '28  27  24   '$ Calcium 8.9 - 10.3 mg/dL 9.2  9.1  10.0   Total Protein 6.5 - 8.1 g/dL 7.0  6.7  7.7   Total Bilirubin 0.3 - 1.2 mg/dL 0.4  0.4  1.3   Alkaline Phos 38 - 126 U/L 79  75  46   AST 15 - 41 U/L 23  30  32   ALT 0 - 44 U/L '30  27  23     '$ Lab Results  Component Value Date   WBC 7.5 06/03/2022   HGB 13.0 06/03/2022   HCT 39.1 06/03/2022   MCV 95.8 06/03/2022   PLT 238 06/03/2022   NEUTROABS 4.3 06/03/2022    ASSESSMENT & PLAN:  No problem-specific Assessment & Plan notes found for this encounter.    No orders of the defined types were placed in this encounter.  The patient has a good understanding of the overall plan. she agrees with it. she will call with any problems that may develop before the next visit here. Total time spent: 30 mins including face to face time and time spent for planning, charting and co-ordination of care   Suzzette Righter, [place] 06/10/22    I Gardiner Coins am acting as a Education administrator for [employer]  ***

## 2022-06-11 ENCOUNTER — Other Ambulatory Visit: Payer: Self-pay | Admitting: *Deleted

## 2022-06-11 DIAGNOSIS — C50412 Malignant neoplasm of upper-outer quadrant of left female breast: Secondary | ICD-10-CM

## 2022-06-12 ENCOUNTER — Inpatient Hospital Stay (HOSPITAL_BASED_OUTPATIENT_CLINIC_OR_DEPARTMENT_OTHER): Payer: 59 | Admitting: Hematology and Oncology

## 2022-06-12 ENCOUNTER — Inpatient Hospital Stay: Payer: 59

## 2022-06-12 VITALS — BP 109/65 | HR 93 | Temp 97.5°F | Resp 18 | Ht 68.0 in | Wt 177.1 lb

## 2022-06-12 DIAGNOSIS — Z5112 Encounter for antineoplastic immunotherapy: Secondary | ICD-10-CM | POA: Diagnosis not present

## 2022-06-12 DIAGNOSIS — C50412 Malignant neoplasm of upper-outer quadrant of left female breast: Secondary | ICD-10-CM | POA: Diagnosis not present

## 2022-06-12 DIAGNOSIS — Z171 Estrogen receptor negative status [ER-]: Secondary | ICD-10-CM

## 2022-06-12 DIAGNOSIS — Z95828 Presence of other vascular implants and grafts: Secondary | ICD-10-CM

## 2022-06-12 LAB — CBC WITH DIFFERENTIAL (CANCER CENTER ONLY)
Abs Immature Granulocytes: 0.83 10*3/uL — ABNORMAL HIGH (ref 0.00–0.07)
Basophils Absolute: 0 10*3/uL (ref 0.0–0.1)
Basophils Relative: 0 %
Eosinophils Absolute: 0.1 10*3/uL (ref 0.0–0.5)
Eosinophils Relative: 1 %
HCT: 36.1 % (ref 36.0–46.0)
Hemoglobin: 12.2 g/dL (ref 12.0–15.0)
Immature Granulocytes: 7 %
Lymphocytes Relative: 25 %
Lymphs Abs: 2.8 10*3/uL (ref 0.7–4.0)
MCH: 32.2 pg (ref 26.0–34.0)
MCHC: 33.8 g/dL (ref 30.0–36.0)
MCV: 95.3 fL (ref 80.0–100.0)
Monocytes Absolute: 2.2 10*3/uL — ABNORMAL HIGH (ref 0.1–1.0)
Monocytes Relative: 20 %
Neutro Abs: 5.4 10*3/uL (ref 1.7–7.7)
Neutrophils Relative %: 47 %
Platelet Count: 180 10*3/uL (ref 150–400)
RBC: 3.79 MIL/uL — ABNORMAL LOW (ref 3.87–5.11)
RDW: 13 % (ref 11.5–15.5)
Smear Review: NORMAL
WBC Count: 11.4 10*3/uL — ABNORMAL HIGH (ref 4.0–10.5)
nRBC: 0 % (ref 0.0–0.2)

## 2022-06-12 LAB — CMP (CANCER CENTER ONLY)
ALT: 28 U/L (ref 0–44)
AST: 16 U/L (ref 15–41)
Albumin: 4.2 g/dL (ref 3.5–5.0)
Alkaline Phosphatase: 89 U/L (ref 38–126)
Anion gap: 8 (ref 5–15)
BUN: 8 mg/dL (ref 6–20)
CO2: 26 mmol/L (ref 22–32)
Calcium: 9.4 mg/dL (ref 8.9–10.3)
Chloride: 106 mmol/L (ref 98–111)
Creatinine: 0.73 mg/dL (ref 0.44–1.00)
GFR, Estimated: >60 mL/min (ref >60)
Glucose, Bld: 104 mg/dL — ABNORMAL HIGH (ref 70–99)
Potassium: 3.6 mmol/L (ref 3.5–5.1)
Sodium: 140 mmol/L (ref 135–145)
Total Bilirubin: 0.4 mg/dL (ref 0.3–1.2)
Total Protein: 6.8 g/dL (ref 6.5–8.1)

## 2022-06-12 MED ORDER — HEPARIN SOD (PORK) LOCK FLUSH 100 UNIT/ML IV SOLN
500.0000 [IU] | Freq: Once | INTRAVENOUS | Status: AC
  Administered 2022-06-12: 500 [IU]

## 2022-06-12 MED ORDER — SODIUM CHLORIDE 0.9% FLUSH
10.0000 mL | Freq: Once | INTRAVENOUS | Status: AC
  Administered 2022-06-12: 10 mL

## 2022-06-12 MED ORDER — DIPHENOXYLATE-ATROPINE 2.5-0.025 MG PO TABS: 1.0000 | ORAL_TABLET | Freq: Four times a day (QID) | ORAL | 1 refills | Status: DC | PRN

## 2022-06-12 NOTE — Patient Instructions (Signed)

## 2022-06-12 NOTE — Assessment & Plan Note (Addendum)
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 Neoadjuvant chemotherapy with TCH Perjeta 6 cycles followed by Herceptin Perjeta maintenance versus Kadcyla maintenance (based on response to neoadjuvant chemo) for 1 year 2. Followed by breast conserving surgery if possible with sentinel lymph node study 3. Followed by adjuvant radiation therapy --------------------------------------------------------------------------------------------------------------------------------- Current treatment: Cycle 1 day 8 TCH Perjeta Chemo toxicities: Rash on right arms: Was treated with Kenalog Diarrhea: Managed with Imodium, I sent a new prescription for Lomotil. Rash on the bottom: Related to diarrhea Body aches and pains from Neulasta Metallic taste in the mouth  Monitoring closely for toxicities. Return to clinic in 2 weeks for cycle 2

## 2022-06-17 ENCOUNTER — Telehealth: Payer: Self-pay | Admitting: *Deleted

## 2022-06-17 NOTE — Progress Notes (Signed)
Patient Care Team: Marda Stalker, PA-C as PCP - General (Family Medicine) Stark Klein, MD as Consulting Physician (General Surgery) Nicholas Lose, MD as Consulting Physician (Hematology and Oncology) Gery Pray, MD as Consulting Physician (Radiation Oncology) Rockwell Germany, RN as Oncology Nurse Navigator Mauro Kaufmann, RN as Oncology Nurse Navigator  DIAGNOSIS:  Encounter Diagnosis  Name Primary?   Malignant neoplasm of upper-outer quadrant of left breast in female, estrogen receptor negative ([place]) Yes    SUMMARY OF ONCOLOGIC HISTORY: Oncology History  Malignant neoplasm of upper-outer quadrant of left breast in female, estrogen receptor negative ([place])  05/15/2022 Initial Diagnosis   Palpable mass in the left breast at 1 o'clock position measured 4.2 cm ultrasound-guided biopsy revealed grade 3 IDC ER/PR negative HER2 positive Ki-67 35%, left axillary lymph node: Biopsy positive   05/21/2022 Cancer Staging   Staging form: Breast, AJCC 8th Edition - Clinical stage from 05/21/2022: Stage IIB (cT2, cN1, cM0, G3, ER-, PR-, HER2+) - Signed by Nicholas Lose, MD on 05/21/2022 Stage prefix: Initial diagnosis Histologic grading system: 3 grade system   05/29/2022 Genetic Testing   Negative [facility] Multi-Cancer +RNA Panel.  Report date is 05/29/2022.   The Multi-Cancer + RNA Panel offered by [facility] includes sequencing and/or deletion/duplication analysis of the following 70 genes:  AIP*, ALK, APC*, ATM*, AXIN2*, BAP1*, BARD1*, BLM*, BMPR1A*, BRCA1*, BRCA2*, BRIP1*, CDC73*, CDH1*, CDK4, CDKN1B*, CDKN2A, CHEK2*, CTNNA1*, DICER1*, EPCAM (del/dup only), EGFR, FH*, FLCN*, GREM1 (promoter dup only), HOXB13, KIT, LZTR1, MAX*, MBD4, MEN1*, MET, MITF, MLH1*, MSH2*, MSH3*, MSH6*, MUTYH*, NF1*, NF2*, NTHL1*, PALB2*, PDGFRA, PMS2*, POLD1*, POLE*, POT1*, PRKAR1A*, PTCH1*, PTEN*, RAD51C*, RAD51D*, RB1*, RET, SDHA* (sequencing only), SDHAF2*, SDHB*, SDHC*, SDHD*, SMAD4*, SMARCA4*, SMARCB1*, SMARCE1*,  STK11*, SUFU*, TMEM127*, TP53*, TSC1*, TSC2*, VHL*. RNA analysis is performed for * genes.   06/05/2022 -  Chemotherapy   Patient is on Treatment Plan : BREAST  Docetaxel + Carboplatin + Trastuzumab + Pertuzumab  (TCHP) q21d        CHIEF COMPLIANT: Cycle 2 TCH Perjeta  INTERVAL HISTORY: Erika [name] is a  62 y.o. female is here because of recent diagnosis of Left Breast cancer. She presents to the clinic for a follow-up and treatment. She states that she is working on remedies to improve the side effects. She reports metallic taste and mouth sores. She says that her legs was jumping yesterday after she worked out. She states that it is coming from the steroids. She did some stretching to try to stop it. She complains of restless legs. She is getting the rash under control. She says it was all over face, neck and on hands. The rash started 3 days after. The rash has subsided as of today. She complains of it been a pretty big thing.   ALLERGIES:  is allergic to penicillins.  MEDICATIONS:  Current Outpatient Medications  Medication Sig Dispense Refill   aspirin EC 81 MG tablet Take 81 mg by mouth daily.     diphenoxylate-atropine (LOMOTIL) 2.5-0.025 MG tablet Take 1 tablet by mouth 4 (four) times daily as needed for diarrhea or loose stools. 30 tablet 1   lisinopril (ZESTRIL) 20 MG tablet Take 20 mg by mouth daily.     loratadine (CLARITIN) 10 MG tablet Take 10 mg by mouth daily as needed for allergies.     LORazepam (ATIVAN) 0.5 MG tablet Take 1 tablet (0.5 mg total) by mouth daily as needed for anxiety. 30 tablet 0   oxyCODONE (OXY IR/ROXICODONE) 5 MG immediate release  tablet Take 1 tablet (5 mg total) by mouth every 6 (six) hours as needed for severe pain. 5 tablet 0   rosuvastatin (CRESTOR) 20 MG tablet Take 20 mg by mouth daily.     sertraline (ZOLOFT) 100 MG tablet Take 100 mg by mouth daily.     triamcinolone ointment (KENALOG) 0.5 % Apply 1 Application topically 2 (two) times daily. 30  g 0   lidocaine-prilocaine (EMLA) cream Apply to affected area once 30 g 3   ondansetron (ZOFRAN) 8 MG tablet Take 1 tablet (8 mg total) by mouth every 8 (eight) hours as needed for nausea or vomiting. Start on the third day after chemotherapy. 30 tablet 1   prochlorperazine (COMPAZINE) 10 MG tablet Take 1 tablet (10 mg total) by mouth every 6 (six) hours as needed for nausea or vomiting. (Patient not taking: Reported on 06/12/2022) 30 tablet 1   No current facility-administered medications for this visit.    PHYSICAL EXAMINATION: ECOG PERFORMANCE STATUS: 1 - Symptomatic but completely ambulatory  Vitals:   06/26/22 0953  BP: 137/70  Pulse: 88  Resp: 18  Temp: 97.7 F (36.5 C)  SpO2: 100%   Filed Weights   06/26/22 0953  Weight: 178 lb 3.2 oz (80.8 kg)      LABORATORY DATA:  I have reviewed the data as listed    Latest Ref Rng & Units 06/12/2022   11:36 AM 06/03/2022    1:32 PM 05/21/2022   12:10 PM  CMP  Glucose 70 - 99 mg/dL 104  107  124   BUN 6 - 20 mg/dL 8  13  11    Creatinine 0.44 - 1.00 mg/dL 0.73  0.74  0.76   Sodium 135 - 145 mmol/L 140  140  138   Potassium 3.5 - 5.1 mmol/L 3.6  4.5  4.1   Chloride 98 - 111 mmol/L 106  105  105   CO2 22 - 32 mmol/L 26  28  27    Calcium 8.9 - 10.3 mg/dL 9.4  9.2  9.1   Total Protein 6.5 - 8.1 g/dL 6.8  7.0  6.7   Total Bilirubin 0.3 - 1.2 mg/dL 0.4  0.4  0.4   Alkaline Phos 38 - 126 U/L 89  79  75   AST 15 - 41 U/L 16  23  30    ALT 0 - 44 U/L 28  30  27      Lab Results  Component Value Date   WBC 11.4 (H) 06/26/2022   HGB 11.4 (L) 06/26/2022   HCT 33.7 (L) 06/26/2022   MCV 94.9 06/26/2022   PLT 377 06/26/2022   NEUTROABS 8.6 (H) 06/26/2022    ASSESSMENT & PLAN:  Malignant neoplasm of upper-outer quadrant of left breast in female, estrogen receptor negative ([place]) 05/15/2022:Palpable mass in the left breast at 1 o'clock position measured 4.2 cm ultrasound-guided biopsy revealed grade 3 IDC ER/PR negative HER2 positive Ki-67  35%, left axillary lymph node: Biopsy positive   Treatment plan: 1. Neoadjuvant chemotherapy with TCH Perjeta 6 cycles followed by Herceptin Perjeta maintenance versus Kadcyla maintenance (based on response to neoadjuvant chemo) for 1 year 2. Followed by breast conserving surgery if possible with sentinel lymph node study 3. Followed by adjuvant radiation therapy --------------------------------------------------------------------------------------------------------------------------------- Current treatment: Cycle 2 TCH Perjeta Chemo toxicities: Rash on right arms: Was treated with Kenalog Diarrhea: Managed with Imodium, I sent a new prescription for Lomotil. Body aches and pains from Neulasta Metallic taste in the mouth: She will  try putting some is in the mouth during chemo.vgscr    Monitoring closely for toxicities. She is using Dignicap for prevention of hair loss Return to clinic in 3 weeks for cycle 3    No orders of the defined types were placed in this encounter.  The patient has a good understanding of the overall plan. she agrees with it. she will call with any problems that may develop before the next visit here. Total time spent: 30 mins including face to face time and time spent for planning, charting and co-ordination of care   Harriette Ohara, MD 06/26/22    I Gardiner Coins am acting as a Education administrator for [employer]  I have reviewed the above documentation for accuracy and completeness, and I agree with the above.

## 2022-06-17 NOTE — Telephone Encounter (Signed)
Received call from pt requesting advice from MD if okay to take OTC Flonase and Zyrtec for seasonal allergies.  Per MD okay to proceed.  Pt educated and verbalized understanding.

## 2022-06-25 ENCOUNTER — Telehealth: Payer: Self-pay | Admitting: Hematology and Oncology

## 2022-06-25 MED FILL — Dexamethasone Sodium Phosphate Inj 100 MG/10ML: INTRAMUSCULAR | Qty: 1 | Status: AC

## 2022-06-25 MED FILL — Fosaprepitant Dimeglumine For IV Infusion 150 MG (Base Eq): INTRAVENOUS | Qty: 5 | Status: AC

## 2022-06-25 NOTE — Telephone Encounter (Signed)
Rescheduled appointment per provider template change. Left voicemail.

## 2022-06-26 ENCOUNTER — Inpatient Hospital Stay: Payer: 59

## 2022-06-26 ENCOUNTER — Inpatient Hospital Stay (HOSPITAL_BASED_OUTPATIENT_CLINIC_OR_DEPARTMENT_OTHER): Payer: 59 | Admitting: Hematology and Oncology

## 2022-06-26 VITALS — BP 144/83 | HR 62 | Temp 97.5°F | Resp 18

## 2022-06-26 VITALS — BP 137/70 | HR 88 | Temp 97.7°F | Resp 18 | Ht 68.0 in | Wt 178.2 lb

## 2022-06-26 DIAGNOSIS — C50412 Malignant neoplasm of upper-outer quadrant of left female breast: Secondary | ICD-10-CM

## 2022-06-26 DIAGNOSIS — Z171 Estrogen receptor negative status [ER-]: Secondary | ICD-10-CM | POA: Diagnosis not present

## 2022-06-26 DIAGNOSIS — Z95828 Presence of other vascular implants and grafts: Secondary | ICD-10-CM

## 2022-06-26 DIAGNOSIS — Z5112 Encounter for antineoplastic immunotherapy: Secondary | ICD-10-CM | POA: Diagnosis not present

## 2022-06-26 LAB — CBC WITH DIFFERENTIAL (CANCER CENTER ONLY)
Abs Immature Granulocytes: 0.03 10*3/uL (ref 0.00–0.07)
Basophils Absolute: 0 10*3/uL (ref 0.0–0.1)
Basophils Relative: 0 %
Eosinophils Absolute: 0 10*3/uL (ref 0.0–0.5)
Eosinophils Relative: 0 %
HCT: 33.7 % — ABNORMAL LOW (ref 36.0–46.0)
Hemoglobin: 11.4 g/dL — ABNORMAL LOW (ref 12.0–15.0)
Immature Granulocytes: 0 %
Lymphocytes Relative: 20 %
Lymphs Abs: 2.2 10*3/uL (ref 0.7–4.0)
MCH: 32.1 pg (ref 26.0–34.0)
MCHC: 33.8 g/dL (ref 30.0–36.0)
MCV: 94.9 fL (ref 80.0–100.0)
Monocytes Absolute: 0.5 10*3/uL (ref 0.1–1.0)
Monocytes Relative: 5 %
Neutro Abs: 8.6 10*3/uL — ABNORMAL HIGH (ref 1.7–7.7)
Neutrophils Relative %: 75 %
Platelet Count: 377 10*3/uL (ref 150–400)
RBC: 3.55 MIL/uL — ABNORMAL LOW (ref 3.87–5.11)
RDW: 13.4 % (ref 11.5–15.5)
WBC Count: 11.4 10*3/uL — ABNORMAL HIGH (ref 4.0–10.5)
nRBC: 0 % (ref 0.0–0.2)

## 2022-06-26 LAB — CMP (CANCER CENTER ONLY)
ALT: 29 U/L (ref 0–44)
AST: 15 U/L (ref 15–41)
Albumin: 4.2 g/dL (ref 3.5–5.0)
Alkaline Phosphatase: 97 U/L (ref 38–126)
Anion gap: 9 (ref 5–15)
BUN: 15 mg/dL (ref 6–20)
CO2: 25 mmol/L (ref 22–32)
Calcium: 9.6 mg/dL (ref 8.9–10.3)
Chloride: 109 mmol/L (ref 98–111)
Creatinine: 0.73 mg/dL (ref 0.44–1.00)
GFR, Estimated: >60 mL/min (ref >60)
Glucose, Bld: 142 mg/dL — ABNORMAL HIGH (ref 70–99)
Potassium: 3.4 mmol/L — ABNORMAL LOW (ref 3.5–5.1)
Sodium: 143 mmol/L (ref 135–145)
Total Bilirubin: 0.3 mg/dL (ref 0.3–1.2)
Total Protein: 6.6 g/dL (ref 6.5–8.1)

## 2022-06-26 MED ORDER — HEPARIN SOD (PORK) LOCK FLUSH 100 UNIT/ML IV SOLN
500.0000 [IU] | Freq: Once | INTRAVENOUS | Status: AC | PRN
  Administered 2022-06-26: 500 [IU]

## 2022-06-26 MED ORDER — SODIUM CHLORIDE 0.9 % IV SOLN
75.0000 mg/m2 | Freq: Once | INTRAVENOUS | Status: AC
  Administered 2022-06-26: 160 mg via INTRAVENOUS
  Filled 2022-06-26: qty 16

## 2022-06-26 MED ORDER — ACETAMINOPHEN 325 MG PO TABS
650.0000 mg | ORAL_TABLET | Freq: Once | ORAL | Status: AC
  Administered 2022-06-26: 650 mg via ORAL
  Filled 2022-06-26: qty 2

## 2022-06-26 MED ORDER — SODIUM CHLORIDE 0.9 % IV SOLN
150.0000 mg | Freq: Once | INTRAVENOUS | Status: AC
  Administered 2022-06-26: 150 mg via INTRAVENOUS
  Filled 2022-06-26: qty 150

## 2022-06-26 MED ORDER — SODIUM CHLORIDE 0.9 % IV SOLN: Freq: Once | INTRAVENOUS | Status: AC

## 2022-06-26 MED ORDER — SODIUM CHLORIDE 0.9% FLUSH
10.0000 mL | Freq: Once | INTRAVENOUS | Status: AC
  Administered 2022-06-26: 10 mL

## 2022-06-26 MED ORDER — SODIUM CHLORIDE 0.9% FLUSH
10.0000 mL | INTRAVENOUS | Status: DC | PRN
  Administered 2022-06-26: 10 mL

## 2022-06-26 MED ORDER — SODIUM CHLORIDE 0.9 % IV SOLN
420.0000 mg | Freq: Once | INTRAVENOUS | Status: AC
  Administered 2022-06-26: 420 mg via INTRAVENOUS
  Filled 2022-06-26: qty 14

## 2022-06-26 MED ORDER — SODIUM CHLORIDE 0.9 % IV SOLN
739.2000 mg | Freq: Once | INTRAVENOUS | Status: AC
  Administered 2022-06-26: 740 mg via INTRAVENOUS
  Filled 2022-06-26: qty 74

## 2022-06-26 MED ORDER — DIPHENHYDRAMINE HCL 25 MG PO CAPS
50.0000 mg | ORAL_CAPSULE | Freq: Once | ORAL | Status: AC
  Administered 2022-06-26: 50 mg via ORAL
  Filled 2022-06-26: qty 2

## 2022-06-26 MED ORDER — SODIUM CHLORIDE 0.9 % IV SOLN
10.0000 mg | Freq: Once | INTRAVENOUS | Status: AC
  Administered 2022-06-26: 10 mg via INTRAVENOUS
  Filled 2022-06-26: qty 10

## 2022-06-26 MED ORDER — TRASTUZUMAB-ANNS CHEMO 150 MG IV SOLR
6.0000 mg/kg | Freq: Once | INTRAVENOUS | Status: AC
  Administered 2022-06-26: 504 mg via INTRAVENOUS
  Filled 2022-06-26: qty 24

## 2022-06-26 MED ORDER — PALONOSETRON HCL INJECTION 0.25 MG/5ML
0.2500 mg | Freq: Once | INTRAVENOUS | Status: AC
  Administered 2022-06-26: 0.25 mg via INTRAVENOUS
  Filled 2022-06-26: qty 5

## 2022-06-26 NOTE — Patient Instructions (Signed)
[facility]  Discharge Instructions: Thank you for choosing [facility] to provide your oncology and hematology care.   If you have a lab appointment with the [facility], please go directly to the [facility] and check in at the registration area.   Wear comfortable clothing and clothing appropriate for easy access to any Portacath or PICC line.   We strive to give you quality time with your provider. You may need to reschedule your appointment if you arrive late (15 or more minutes).  Arriving late affects you and other patients whose appointments are after yours.  Also, if you miss three or more appointments without notifying the office, you may be dismissed from the clinic at the provider's discretion.      For prescription refill requests, have your pharmacy contact our office and allow 72 hours for refills to be completed.    Today you received the following chemotherapy and/or immunotherapy agents: Kanjinti, Pertuzumab, Docetaxel, Carboplatin.       To help prevent nausea and vomiting after your treatment, we encourage you to take your nausea medication as directed.  BELOW ARE SYMPTOMS THAT SHOULD BE REPORTED IMMEDIATELY: *FEVER GREATER THAN 100.4 F (38 C) OR HIGHER *CHILLS OR SWEATING *NAUSEA AND VOMITING THAT IS NOT CONTROLLED WITH YOUR NAUSEA MEDICATION *UNUSUAL SHORTNESS OF BREATH *UNUSUAL BRUISING OR BLEEDING *URINARY PROBLEMS (pain or burning when urinating, or frequent urination) *BOWEL PROBLEMS (unusual diarrhea, constipation, pain near the anus) TENDERNESS IN MOUTH AND THROAT WITH OR WITHOUT PRESENCE OF ULCERS (sore throat, sores in mouth, or a toothache) UNUSUAL RASH, SWELLING OR PAIN  UNUSUAL VAGINAL DISCHARGE OR ITCHING   Items with * indicate a potential emergency and should be followed up as soon as possible or go to the Emergency Department if any problems should occur.  Please show the CHEMOTHERAPY ALERT CARD  or IMMUNOTHERAPY ALERT CARD at check-in to the Emergency Department and triage nurse.  Should you have questions after your visit or need to cancel or reschedule your appointment, please contact [facility]  Dept: [phone]  and follow the prompts.  Office hours are 8:00 a.m. to 4:30 p.m. Monday - Friday. Please note that voicemails left after 4:00 p.m. may not be returned until the following business day.  We are closed weekends and major holidays. You have access to a nurse at all times for urgent questions. Please call the main number to the clinic Dept: [phone] and follow the prompts.   For any non-urgent questions, you may also contact your provider using MyChart. We now offer e-Visits for anyone 22 and older to request care online for non-urgent symptoms. For details visit [email or web address].   Also download the MyChart app! Go to the app store, search "MyChart", open the app, select [facility], and log in with your MyChart username and password.

## 2022-06-26 NOTE — Assessment & Plan Note (Signed)
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 Neoadjuvant chemotherapy with TCH Perjeta 6 cycles followed by Herceptin Perjeta maintenance versus Kadcyla maintenance (based on response to neoadjuvant chemo) for 1 year 2. Followed by breast conserving surgery if possible with sentinel lymph node study 3. Followed by adjuvant radiation therapy --------------------------------------------------------------------------------------------------------------------------------- Current treatment: Cycle 2 TCH Perjeta Chemo toxicities: Rash on right arms: Was treated with Kenalog Diarrhea: Managed with Imodium, I sent a new prescription for Lomotil. Rash on the bottom: Related to diarrhea Body aches and pains from Neulasta Metallic taste in the mouth   Monitoring closely for toxicities. She is using Dignicap for prevention of hair loss Return to clinic in 3 weeks for cycle 3

## 2022-06-28 ENCOUNTER — Inpatient Hospital Stay: Payer: 59

## 2022-06-28 VITALS — BP 125/86 | HR 68 | Temp 97.9°F | Resp 16

## 2022-06-28 DIAGNOSIS — Z5112 Encounter for antineoplastic immunotherapy: Secondary | ICD-10-CM | POA: Diagnosis not present

## 2022-06-28 DIAGNOSIS — C50412 Malignant neoplasm of upper-outer quadrant of left female breast: Secondary | ICD-10-CM

## 2022-06-28 MED ORDER — PEGFILGRASTIM-CBQV 6 MG/0.6ML ~~LOC~~ SOSY
6.0000 mg | PREFILLED_SYRINGE | Freq: Once | SUBCUTANEOUS | Status: AC
  Administered 2022-06-28: 6 mg via SUBCUTANEOUS

## 2022-06-28 NOTE — Patient Instructions (Signed)

## 2022-06-30 ENCOUNTER — Telehealth: Payer: Self-pay | Admitting: Hematology and Oncology

## 2022-06-30 NOTE — Telephone Encounter (Signed)
Scheduled appointments per WQ. Patient is aware of the made appointment.

## 2022-07-01 ENCOUNTER — Inpatient Hospital Stay (HOSPITAL_BASED_OUTPATIENT_CLINIC_OR_DEPARTMENT_OTHER): Payer: 59 | Admitting: Physician Assistant

## 2022-07-01 ENCOUNTER — Telehealth: Payer: Self-pay

## 2022-07-01 ENCOUNTER — Other Ambulatory Visit: Payer: Self-pay

## 2022-07-01 ENCOUNTER — Inpatient Hospital Stay: Payer: 59

## 2022-07-01 ENCOUNTER — Encounter: Payer: Self-pay | Admitting: Adult Health

## 2022-07-01 VITALS — BP 112/71 | HR 73 | Temp 98.3°F | Resp 16 | Wt 176.8 lb

## 2022-07-01 DIAGNOSIS — Z5112 Encounter for antineoplastic immunotherapy: Secondary | ICD-10-CM | POA: Diagnosis not present

## 2022-07-01 DIAGNOSIS — T451X5A Adverse effect of antineoplastic and immunosuppressive drugs, initial encounter: Secondary | ICD-10-CM

## 2022-07-01 DIAGNOSIS — L270 Generalized skin eruption due to drugs and medicaments taken internally: Secondary | ICD-10-CM

## 2022-07-01 DIAGNOSIS — C50412 Malignant neoplasm of upper-outer quadrant of left female breast: Secondary | ICD-10-CM

## 2022-07-01 DIAGNOSIS — Z95828 Presence of other vascular implants and grafts: Secondary | ICD-10-CM

## 2022-07-01 DIAGNOSIS — Z171 Estrogen receptor negative status [ER-]: Secondary | ICD-10-CM | POA: Diagnosis not present

## 2022-07-01 MED ORDER — METHYLPREDNISOLONE 4 MG PO TBPK: ORAL_TABLET | ORAL | 0 refills | Status: DC

## 2022-07-01 MED ORDER — SODIUM CHLORIDE 0.9% FLUSH
10.0000 mL | Freq: Once | INTRAVENOUS | Status: AC
  Administered 2022-07-01: 10 mL

## 2022-07-01 MED ORDER — HEPARIN SOD (PORK) LOCK FLUSH 100 UNIT/ML IV SOLN
500.0000 [IU] | Freq: Once | INTRAVENOUS | Status: AC
  Administered 2022-07-01: 500 [IU]

## 2022-07-01 MED ORDER — METHYLPREDNISOLONE SODIUM SUCC 125 MG IJ SOLR
125.0000 mg | Freq: Once | INTRAMUSCULAR | Status: AC
  Administered 2022-07-01: 125 mg via INTRAVENOUS
  Filled 2022-07-01: qty 2

## 2022-07-01 NOTE — Telephone Encounter (Signed)
Attempted to call pt per mychart message to offer [facility] visit with PA at 3PM. LVM for call back.

## 2022-07-01 NOTE — Progress Notes (Signed)
Symptom Management Consult Note [facility]    Patient Care Team: Marda Stalker, PA-C as PCP - General (Family Medicine) Stark Klein, MD as Consulting Physician (General Surgery) Nicholas Lose, MD as Consulting Physician (Hematology and Oncology) Gery Pray, MD as Consulting Physician (Radiation Oncology) Rockwell Germany, RN as Oncology Nurse Navigator Mauro Kaufmann, RN as Oncology Nurse Navigator    Name / MRN / DOB: Erika [name]  BX:[id number]  [birth year]   Date of visit: 07/01/2022   Chief Complaint/Reason for visit: rash   Current Therapy: TCHP  Last treatment:  Day 1   Cycle 2 on 06/26/22   ASSESSMENT & PLAN: Patient is a 62 y.o. female  with oncologic history of malignant neoplasm of upper-outer quadrant of left breast in female, estrogen receptor negative followed by Dr. Lindi Adie.  I have viewed most recent oncology note and lab work.    #Malignant neoplasm of upper-outer quadrant of left breast in female, estrogen receptor negative - Next appointment with oncologist is 07/17/22   #Rash - Patient with widespread taxane rash. - Patient given dose of IV Solu-Medrol in clinic and will start Medrol Dosepak tomorrow. - Will defer to oncologist to make treatment changes if deemed necessary. - Strict ED precautions discussed should symptoms worsen.     Heme/Onc History: Oncology History  Malignant neoplasm of upper-outer quadrant of left breast in female, estrogen receptor negative ([place])  05/15/2022 Initial Diagnosis   Palpable mass in the left breast at 1 o'clock position measured 4.2 cm ultrasound-guided biopsy revealed grade 3 IDC ER/PR negative HER2 positive Ki-67 35%, left axillary lymph node: Biopsy positive   05/21/2022 Cancer Staging   Staging form: Breast, AJCC 8th Edition - Clinical stage from 05/21/2022: Stage IIB (cT2, cN1, cM0, G3, ER-, PR-, HER2+) - Signed by Nicholas Lose, MD on 05/21/2022 Stage prefix: Initial  diagnosis Histologic grading system: 3 grade system   05/29/2022 Genetic Testing   Negative [facility] Multi-Cancer +RNA Panel.  Report date is 05/29/2022.   The Multi-Cancer + RNA Panel offered by [facility] includes sequencing and/or deletion/duplication analysis of the following 70 genes:  AIP*, ALK, APC*, ATM*, AXIN2*, BAP1*, BARD1*, BLM*, BMPR1A*, BRCA1*, BRCA2*, BRIP1*, CDC73*, CDH1*, CDK4, CDKN1B*, CDKN2A, CHEK2*, CTNNA1*, DICER1*, EPCAM (del/dup only), EGFR, FH*, FLCN*, GREM1 (promoter dup only), HOXB13, KIT, LZTR1, MAX*, MBD4, MEN1*, MET, MITF, MLH1*, MSH2*, MSH3*, MSH6*, MUTYH*, NF1*, NF2*, NTHL1*, PALB2*, PDGFRA, PMS2*, POLD1*, POLE*, POT1*, PRKAR1A*, PTCH1*, PTEN*, RAD51C*, RAD51D*, RB1*, RET, SDHA* (sequencing only), SDHAF2*, SDHB*, SDHC*, SDHD*, SMAD4*, SMARCA4*, SMARCB1*, SMARCE1*, STK11*, SUFU*, TMEM127*, TP53*, TSC1*, TSC2*, VHL*. RNA analysis is performed for * genes.   06/05/2022 -  Chemotherapy   Patient is on Treatment Plan : BREAST  Docetaxel + Carboplatin + Trastuzumab + Pertuzumab  (TCHP) q21d          Interval history-: Erika [name] is a 62 y.o. female with oncologic history as above presenting to [facility] today with chief complaint of rash.  She presents unaccompanied to clinic.  Patient seen by me on 06/09/22 for rash on right forearm that was treated with Kenalog ointment.  That rash resolved.  Patient states after her second chemotherapy she again developed a rash.  This time it started on her back and spread to her legs and arms.  She states the rash has been progressively worsening.  She admits to extreme itching.  She has been taking p.o. Benadryl at home without improvement.  The rash is a bothersome and keeps her awake  at night.  She has been using nonallergenic soaps, lotions, and laundry detergent.  She has been taking cool baths.  She is frustrated the rash is ongoing despite her attempts to control it. She also reports having diarrhea at home that is controlled with  Imodium. No one in the home has similar rash.      ROS  All other systems are reviewed and are negative for acute change except as noted in the HPI.    Allergies  Allergen Reactions   Penicillins Nausea Only     Past Medical History:  Diagnosis Date   Anxiety    Colitis 04/07/2008   seen on CT (at ER visit)   Depression    GERD (gastroesophageal reflux disease)    Hyperplastic colon polyp    Hypertension    Liver hemangioma 04/07/2008   seen on CT     Past Surgical History:  Procedure Laterality Date   "freezing of cervix"     ABDOMINAL HYSTERECTOMY     BREAST BIOPSY Left 05/15/2022   [place] LT BREAST BX W LOC DEV 1ST LESION IMG BX [place] US GUIDE 05/15/2022 [facility] MAMMOGRAPHY   CESAREAN SECTION     NASAL SINUS SURGERY     PORTACATH PLACEMENT N/A 05/29/2022   Procedure: INSERTION PORT-A-CATH;  Surgeon: Stark Klein, MD;  Location: [facility];  Service: General;  Laterality: N/A;   right kneecap surgery +      Social History   Socioeconomic History   Marital status: Married    Spouse name: Not on file   Number of children: 0   Years of education: Not on file   Highest education level: Not on file  Occupational History   Occupation: [employer]  Tobacco Use   Smoking status: Never   Smokeless tobacco: Never   Tobacco comments:    smoked 1 year while in college  Vaping Use   Vaping Use: Never used  Substance and Sexual Activity   Alcohol use: Yes    Alcohol/week: 14.0 standard drinks of alcohol    Types: 14 Glasses of wine per week    Comment: wine - daily   Drug use: No   Sexual activity: Not on file  Other Topics Concern   Not on file  Social History Narrative   1 caffeine drinks daily    Social Determinants of Health   Financial Resource Strain: Not on file  Food Insecurity: Not on file  Transportation Needs: Not on file  Physical Activity: Not on file  Stress: Not on file  Social Connections: Not on file  Intimate Partner Violence: Not on file     Family History  Problem Relation Age of Onset   Kidney cancer Mother        dx after 33; early stage   Hypertension Father    Heart disease Father    Colon polyps Father    Lung cancer Father 39       smoking hx   Colon polyps Sister    Hypertension Brother    Cancer Maternal Uncle        ? blood cancer; dx after 32   Cancer Maternal Grandmother        ? gallbladder ? colon; d. after 60   Diabetes Paternal Grandmother      Current Outpatient Medications:    [START ON 07/02/2022] methylPREDNISolone (MEDROL DOSEPAK) 4 MG TBPK tablet, Take 6 pills by mouth day 1, 5 on day 2, 4 on day 3, 3 on day  4, 2 on day 5, 1 on day 6, Disp: 21 tablet, Rfl: 0   aspirin EC 81 MG tablet, Take 81 mg by mouth daily., Disp: , Rfl:    diphenoxylate-atropine (LOMOTIL) 2.5-0.025 MG tablet, Take 1 tablet by mouth 4 (four) times daily as needed for diarrhea or loose stools., Disp: 30 tablet, Rfl: 1   lidocaine-prilocaine (EMLA) cream, Apply to affected area once, Disp: 30 g, Rfl: 3   lisinopril (ZESTRIL) 20 MG tablet, Take 20 mg by mouth daily., Disp: , Rfl:    loratadine (CLARITIN) 10 MG tablet, Take 10 mg by mouth daily as needed for allergies., Disp: , Rfl:    LORazepam (ATIVAN) 0.5 MG tablet, Take 1 tablet (0.5 mg total) by mouth daily as needed for anxiety., Disp: 30 tablet, Rfl: 0   ondansetron (ZOFRAN) 8 MG tablet, Take 1 tablet (8 mg total) by mouth every 8 (eight) hours as needed for nausea or vomiting. Start on the third day after chemotherapy., Disp: 30 tablet, Rfl: 1   oxyCODONE (OXY IR/ROXICODONE) 5 MG immediate release tablet, Take 1 tablet (5 mg total) by mouth every 6 (six) hours as needed for severe pain., Disp: 5 tablet, Rfl: 0   prochlorperazine (COMPAZINE) 10 MG tablet, Take 1 tablet (10 mg total) by mouth every 6 (six) hours as needed for nausea or vomiting. (Patient not taking: Reported on 06/12/2022), Disp: 30 tablet, Rfl: 1   rosuvastatin (CRESTOR) 20 MG tablet, Take 20 mg by mouth  daily., Disp: , Rfl:    sertraline (ZOLOFT) 100 MG tablet, Take 100 mg by mouth daily., Disp: , Rfl:    triamcinolone ointment (KENALOG) 0.5 %, Apply 1 Application topically 2 (two) times daily., Disp: 30 g, Rfl: 0  PHYSICAL EXAM: ECOG FS:1 - Symptomatic but completely ambulatory    Vitals:   07/01/22 1513  BP: 112/71  Pulse: 73  Resp: 16  Temp: 98.3 F (36.8 C)  TempSrc: Oral  SpO2: 100%  Weight: 176 lb 12.8 oz (80.2 kg)   Physical Exam Vitals and nursing note reviewed.  Constitutional:      Appearance: She is well-developed. She is not ill-appearing or toxic-appearing.  HENT:     Head: Normocephalic.     Nose: Nose normal.  Eyes:     Conjunctiva/sclera: Conjunctivae normal.  Neck:     Vascular: No JVD.  Cardiovascular:     Rate and Rhythm: Normal rate and regular rhythm.     Pulses: Normal pulses.     Heart sounds: Normal heart sounds.  Pulmonary:     Effort: Pulmonary effort is normal.     Breath sounds: Normal breath sounds.  Abdominal:     General: There is no distension.  Musculoskeletal:     Cervical back: Normal range of motion.  Skin:    General: Skin is warm and dry.     Findings: Rash present.     Comments: Rash on torso and extremities. Please see media below  Neurological:     Mental Status: She is oriented to person, place, and time.        LABORATORY DATA: I have reviewed the data as listed    Latest Ref Rng & Units 06/26/2022    9:27 AM 06/12/2022   11:36 AM 06/03/2022    1:32 PM  CBC  WBC 4.0 - 10.5 K/uL 11.4  11.4  7.5   Hemoglobin 12.0 - 15.0 g/dL 11.4  12.2  13.0   Hematocrit 36.0 - 46.0 % 33.7  36.1  39.1   Platelets 150 - 400 K/uL 377  180  238         Latest Ref Rng & Units 06/26/2022    9:27 AM 06/12/2022   11:36 AM 06/03/2022    1:32 PM  CMP  Glucose 70 - 99 mg/dL 142  104  107   BUN 6 - 20 mg/dL 15  8  13    Creatinine 0.44 - 1.00 mg/dL 0.73  0.73  0.74   Sodium 135 - 145 mmol/L 143  140  140   Potassium 3.5 - 5.1 mmol/L  3.4  3.6  4.5   Chloride 98 - 111 mmol/L 109  106  105   CO2 22 - 32 mmol/L 25  26  28    Calcium 8.9 - 10.3 mg/dL 9.6  9.4  9.2   Total Protein 6.5 - 8.1 g/dL 6.6  6.8  7.0   Total Bilirubin 0.3 - 1.2 mg/dL 0.3  0.4  0.4   Alkaline Phos 38 - 126 U/L 97  89  79   AST 15 - 41 U/L 15  16  23    ALT 0 - 44 U/L 29  28  30         RADIOGRAPHIC STUDIES (from last 24 hours if applicable) I have personally reviewed the radiological images as listed and agreed with the findings in the report. No results found.      Visit Diagnosis: 1. Adverse effect of chemotherapy, initial encounter   2. Malignant neoplasm of upper-outer quadrant of left breast in female, estrogen receptor negative ([place])      No orders of the defined types were placed in this encounter.   All questions were answered. The patient knows to call the clinic with any problems, questions or concerns. No barriers to learning was detected.  I have spent a total of 30 minutes minutes of face-to-face and non-face-to-face time, preparing to see the patient, obtaining and/or reviewing separately obtained history, performing a medically appropriate examination, counseling and educating the patient, ordering medication, documenting clinical information in the electronic health record.    Thank you for allowing me to participate in the care of this patient.    Barrie Folk, PA-C Department of Hematology/Oncology [facility] at [facility] Phone: [phone]  [phone]    07/01/2022 4:19 PM

## 2022-07-01 NOTE — Telephone Encounter (Signed)
Pt called back and accepted appt with [facility] for 3 PM.

## 2022-07-01 NOTE — Progress Notes (Signed)
Patient reported she preferred PIV for dose of solu medrol since it will be quick. RN attempt X2 with no success, port used with no complaint.

## 2022-07-08 ENCOUNTER — Other Ambulatory Visit: Payer: Self-pay

## 2022-07-08 ENCOUNTER — Encounter: Payer: Self-pay | Admitting: Physician Assistant

## 2022-07-09 ENCOUNTER — Other Ambulatory Visit: Payer: Self-pay

## 2022-07-09 ENCOUNTER — Inpatient Hospital Stay: Payer: 59 | Attending: Hematology and Oncology | Admitting: Hematology and Oncology

## 2022-07-09 VITALS — BP 135/80 | HR 81 | Temp 97.5°F | Resp 18 | Ht 68.0 in | Wt 172.3 lb

## 2022-07-09 DIAGNOSIS — C773 Secondary and unspecified malignant neoplasm of axilla and upper limb lymph nodes: Secondary | ICD-10-CM | POA: Diagnosis not present

## 2022-07-09 DIAGNOSIS — Z171 Estrogen receptor negative status [ER-]: Secondary | ICD-10-CM

## 2022-07-09 DIAGNOSIS — Z79899 Other long term (current) drug therapy: Secondary | ICD-10-CM | POA: Diagnosis not present

## 2022-07-09 DIAGNOSIS — Z5111 Encounter for antineoplastic chemotherapy: Secondary | ICD-10-CM | POA: Diagnosis present

## 2022-07-09 DIAGNOSIS — Z7982 Long term (current) use of aspirin: Secondary | ICD-10-CM | POA: Diagnosis not present

## 2022-07-09 DIAGNOSIS — C50412 Malignant neoplasm of upper-outer quadrant of left female breast: Secondary | ICD-10-CM | POA: Insufficient documentation

## 2022-07-09 DIAGNOSIS — Z5189 Encounter for other specified aftercare: Secondary | ICD-10-CM | POA: Insufficient documentation

## 2022-07-09 DIAGNOSIS — Z5112 Encounter for antineoplastic immunotherapy: Secondary | ICD-10-CM | POA: Insufficient documentation

## 2022-07-09 DIAGNOSIS — R21 Rash and other nonspecific skin eruption: Secondary | ICD-10-CM | POA: Diagnosis not present

## 2022-07-09 MED ORDER — TRIAMCINOLONE ACETONIDE 0.5 % EX OINT: 1.0000 | TOPICAL_OINTMENT | Freq: Two times a day (BID) | CUTANEOUS | 2 refills | Status: DC

## 2022-07-09 MED ORDER — METHYLPREDNISOLONE 4 MG PO TBPK: ORAL_TABLET | ORAL | 0 refills | Status: DC

## 2022-07-09 MED ORDER — DOXYCYCLINE HYCLATE 100 MG PO TABS: 100.0000 mg | ORAL_TABLET | Freq: Two times a day (BID) | ORAL | 0 refills | Status: DC

## 2022-07-09 NOTE — Assessment & Plan Note (Signed)
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 Neoadjuvant chemotherapy with TCH Perjeta 6 cycles followed by Herceptin Perjeta maintenance versus Kadcyla maintenance (based on response to neoadjuvant chemo) for 1 year 2. Followed by breast conserving surgery if possible with sentinel lymph node study 3. Followed by adjuvant radiation therapy --------------------------------------------------------------------------------------------------------------------------------- Current treatment: Cycle 3 TCH Perjeta Chemo toxicities: Rash on right arms: Was treated with Kenalog, it relapsed spread to her back arms and legs Diarrhea: Managed with Imodium, I sent a new prescription for Lomotil. Body aches and pains from Neulasta Metallic taste in the mouth: She will try putting some is in the mouth during chemo.vgscr    Monitoring closely for toxicities. She is using Dignicap for prevention of hair loss Return to clinic in 3 weeks for cycle 4

## 2022-07-09 NOTE — Progress Notes (Signed)
Patient Care Team: Marda Stalker, PA-C as PCP - General (Family Medicine) Stark Klein, MD as Consulting Physician (General Surgery) Nicholas Lose, MD as Consulting Physician (Hematology and Oncology) Gery Pray, MD as Consulting Physician (Radiation Oncology) Rockwell Germany, RN as Oncology Nurse Navigator Mauro Kaufmann, RN as Oncology Nurse Navigator  DIAGNOSIS:  Encounter Diagnosis  Name Primary?   Malignant neoplasm of upper-outer quadrant of left breast in female, estrogen receptor negative Yes    SUMMARY OF ONCOLOGIC HISTORY: Oncology History  Malignant neoplasm of upper-outer quadrant of left breast in female, estrogen receptor negative  05/15/2022 Initial Diagnosis   Palpable mass in the left breast at 1 o'clock position measured 4.2 cm ultrasound-guided biopsy revealed grade 3 IDC ER/PR negative HER2 positive Ki-67 35%, left axillary lymph node: Biopsy positive   05/21/2022 Cancer Staging   Staging form: Breast, AJCC 8th Edition - Clinical stage from 05/21/2022: Stage IIB (cT2, cN1, cM0, G3, ER-, PR-, HER2+) - Signed by Nicholas Lose, MD on 05/21/2022 Stage prefix: Initial diagnosis Histologic grading system: 3 grade system   05/29/2022 Genetic Testing   Negative [facility] Multi-Cancer +RNA Panel.  Report date is 05/29/2022.   The Multi-Cancer + RNA Panel offered by [facility] includes sequencing and/or deletion/duplication analysis of the following 70 genes:  AIP*, ALK, APC*, ATM*, AXIN2*, BAP1*, BARD1*, BLM*, BMPR1A*, BRCA1*, BRCA2*, BRIP1*, CDC73*, CDH1*, CDK4, CDKN1B*, CDKN2A, CHEK2*, CTNNA1*, DICER1*, EPCAM (del/dup only), EGFR, FH*, FLCN*, GREM1 (promoter dup only), HOXB13, KIT, LZTR1, MAX*, MBD4, MEN1*, MET, MITF, MLH1*, MSH2*, MSH3*, MSH6*, MUTYH*, NF1*, NF2*, NTHL1*, PALB2*, PDGFRA, PMS2*, POLD1*, POLE*, POT1*, PRKAR1A*, PTCH1*, PTEN*, RAD51C*, RAD51D*, RB1*, RET, SDHA* (sequencing only), SDHAF2*, SDHB*, SDHC*, SDHD*, SMAD4*, SMARCA4*, SMARCB1*, SMARCE1*, STK11*,  SUFU*, TMEM127*, TP53*, TSC1*, TSC2*, VHL*. RNA analysis is performed for * genes.   06/05/2022 -  Chemotherapy   Patient is on Treatment Plan : BREAST  Docetaxel + Carboplatin + Trastuzumab + Pertuzumab  (TCHP) q21d        CHIEF COMPLIANT: Follow-up on profound rash  INTERVAL HISTORY: Erika [name] is a 62 y.o. female is here because of recent diagnosis of Left Breast cancer. She presents to the clinic for a follow-up. She complains of profound rash is turning into sores. She says it is all over face, stomach, back and went down her legs. She reports everything that touches her is bothering her and it makes something. She has not took anything for it. The prednisone did help the itching and gave her some relief. She says the rash on her face hurts and feels like it is pushing out infection.   ALLERGIES:  is allergic to penicillins.  MEDICATIONS:  Current Outpatient Medications  Medication Sig Dispense Refill   aspirin EC 81 MG tablet Take 81 mg by mouth daily.     diphenoxylate-atropine (LOMOTIL) 2.5-0.025 MG tablet Take 1 tablet by mouth 4 (four) times daily as needed for diarrhea or loose stools. 30 tablet 1   doxycycline (VIBRA-TABS) 100 MG tablet Take 1 tablet (100 mg total) by mouth 2 (two) times daily. 14 tablet 0   lisinopril (ZESTRIL) 20 MG tablet Take 20 mg by mouth daily.     loratadine (CLARITIN) 10 MG tablet Take 10 mg by mouth daily as needed for allergies.     LORazepam (ATIVAN) 0.5 MG tablet Take 1 tablet (0.5 mg total) by mouth daily as needed for anxiety. 30 tablet 0   methylPREDNISolone (MEDROL DOSEPAK) 4 MG TBPK tablet Use as directed 21 tablet 0  oxyCODONE (OXY IR/ROXICODONE) 5 MG immediate release tablet Take 1 tablet (5 mg total) by mouth every 6 (six) hours as needed for severe pain. 5 tablet 0   rosuvastatin (CRESTOR) 20 MG tablet Take 20 mg by mouth daily.     sertraline (ZOLOFT) 100 MG tablet Take 100 mg by mouth daily.     triamcinolone ointment (KENALOG) 0.5  % Apply 1 Application topically 2 (two) times daily. 30 g 2   lidocaine-prilocaine (EMLA) cream Apply to affected area once (Patient not taking: Reported on 07/09/2022) 30 g 3   ondansetron (ZOFRAN) 8 MG tablet Take 1 tablet (8 mg total) by mouth every 8 (eight) hours as needed for nausea or vomiting. Start on the third day after chemotherapy. (Patient not taking: Reported on 07/09/2022) 30 tablet 1   prochlorperazine (COMPAZINE) 10 MG tablet Take 1 tablet (10 mg total) by mouth every 6 (six) hours as needed for nausea or vomiting. (Patient not taking: Reported on 07/09/2022) 30 tablet 1   No current facility-administered medications for this visit.    PHYSICAL EXAMINATION: ECOG PERFORMANCE STATUS: 1 - Symptomatic but completely ambulatory  Vitals:   07/09/22 0814  BP: 135/80  Pulse: 81  Resp: 18  Temp: (!) 97.5 F (36.4 C)  SpO2: 97%   Filed Weights   07/09/22 0814  Weight: 172 lb 4.8 oz (78.2 kg)           LABORATORY DATA:  I have reviewed the data as listed    Latest Ref Rng & Units 06/26/2022    9:27 AM 06/12/2022   11:36 AM 06/03/2022    1:32 PM  CMP  Glucose 70 - 99 mg/dL 142  104  107   BUN 6 - 20 mg/dL 15  8  13    Creatinine 0.44 - 1.00 mg/dL 0.73  0.73  0.74   Sodium 135 - 145 mmol/L 143  140  140   Potassium 3.5 - 5.1 mmol/L 3.4  3.6  4.5   Chloride 98 - 111 mmol/L 109  106  105   CO2 22 - 32 mmol/L 25  26  28    Calcium 8.9 - 10.3 mg/dL 9.6  9.4  9.2   Total Protein 6.5 - 8.1 g/dL 6.6  6.8  7.0   Total Bilirubin 0.3 - 1.2 mg/dL 0.3  0.4  0.4   Alkaline Phos 38 - 126 U/L 97  89  79   AST 15 - 41 U/L 15  16  23    ALT 0 - 44 U/L 29  28  30      Lab Results  Component Value Date   WBC 11.4 (H) 06/26/2022   HGB 11.4 (L) 06/26/2022   HCT 33.7 (L) 06/26/2022   MCV 94.9 06/26/2022   PLT 377 06/26/2022   NEUTROABS 8.6 (H) 06/26/2022    ASSESSMENT & PLAN:  Malignant neoplasm of upper-outer quadrant of left breast in female, estrogen receptor negative  ([facility]) 05/15/2022:Palpable mass in the left breast at 1 o'clock position measured 4.2 cm ultrasound-guided biopsy revealed grade 3 IDC ER/PR negative HER2 positive Ki-67 35%, left axillary lymph node: Biopsy positive   Treatment plan: 1. Neoadjuvant chemotherapy with TCH Perjeta 6 cycles followed by Herceptin Perjeta maintenance versus Kadcyla maintenance (based on response to neoadjuvant chemo) for 1 year 2. Followed by breast conserving surgery if possible with sentinel lymph node study 3. Followed by adjuvant radiation therapy --------------------------------------------------------------------------------------------------------------------------------- Current treatment: Cycle 2 day 12 TCH Perjeta Chemo toxicities: Profound maculopapular rash all  over the abdomen with pustules and profound rash on the back.  Macular papular rash on the face as well.  I discussed with the patient that we will need to discontinue Taxotere for fixed drug eruption and substituted with Abraxane.  I sent a prescription for Medrol Dosepak along with doxycycline and also Kenalog ointment. Diarrhea: Managed with Imodium, I sent a new prescription for Lomotil. Body aches and pains from Neulasta Metallic taste in the mouth: She will try putting some is in the mouth during chemo.vgscr    Monitoring closely for toxicities. She is using Dignicap for prevention of hair loss Return to clinic for cycle 3 treatment as previously scheduled and we will try to get insurance authorization for Abraxane.    No orders of the defined types were placed in this encounter.  The patient has a good understanding of the overall plan. she agrees with it. she will call with any problems that may develop before the next visit here. Total time spent: 30 mins including face to face time and time spent for planning, charting and co-ordination of care   Harriette Ohara, MD 07/09/22    I Gardiner Coins am acting as a Education administrator for  [employer]  I have reviewed the above documentation for accuracy and completeness, and I agree with the above.

## 2022-07-15 ENCOUNTER — Telehealth: Payer: Self-pay | Admitting: Hematology and Oncology

## 2022-07-15 NOTE — Telephone Encounter (Signed)
Scheduled appointments per WQ. Patient is aware of the made appointments.  

## 2022-07-16 ENCOUNTER — Other Ambulatory Visit: Payer: Self-pay

## 2022-07-16 MED FILL — Fosaprepitant Dimeglumine For IV Infusion 150 MG (Base Eq): INTRAVENOUS | Qty: 5 | Status: AC

## 2022-07-16 MED FILL — Dexamethasone Sodium Phosphate Inj 100 MG/10ML: INTRAMUSCULAR | Qty: 1 | Status: AC

## 2022-07-17 ENCOUNTER — Inpatient Hospital Stay (HOSPITAL_BASED_OUTPATIENT_CLINIC_OR_DEPARTMENT_OTHER): Payer: 59 | Admitting: Adult Health

## 2022-07-17 ENCOUNTER — Encounter: Payer: Self-pay | Admitting: Adult Health

## 2022-07-17 ENCOUNTER — Inpatient Hospital Stay: Payer: 59

## 2022-07-17 ENCOUNTER — Encounter: Payer: Self-pay | Admitting: *Deleted

## 2022-07-17 DIAGNOSIS — Z95828 Presence of other vascular implants and grafts: Secondary | ICD-10-CM

## 2022-07-17 DIAGNOSIS — Z171 Estrogen receptor negative status [ER-]: Secondary | ICD-10-CM | POA: Diagnosis not present

## 2022-07-17 DIAGNOSIS — Z5112 Encounter for antineoplastic immunotherapy: Secondary | ICD-10-CM | POA: Diagnosis not present

## 2022-07-17 DIAGNOSIS — C50412 Malignant neoplasm of upper-outer quadrant of left female breast: Secondary | ICD-10-CM

## 2022-07-17 LAB — CMP (CANCER CENTER ONLY)
ALT: 39 U/L (ref 0–44)
AST: 19 U/L (ref 15–41)
Albumin: 4 g/dL (ref 3.5–5.0)
Alkaline Phosphatase: 90 U/L (ref 38–126)
Anion gap: 6 (ref 5–15)
BUN: 17 mg/dL (ref 6–20)
CO2: 26 mmol/L (ref 22–32)
Calcium: 9.2 mg/dL (ref 8.9–10.3)
Chloride: 108 mmol/L (ref 98–111)
Creatinine: 0.7 mg/dL (ref 0.44–1.00)
GFR, Estimated: >60 mL/min (ref >60)
Glucose, Bld: 129 mg/dL — ABNORMAL HIGH (ref 70–99)
Potassium: 3.7 mmol/L (ref 3.5–5.1)
Sodium: 140 mmol/L (ref 135–145)
Total Bilirubin: 0.5 mg/dL (ref 0.3–1.2)
Total Protein: 6.3 g/dL — ABNORMAL LOW (ref 6.5–8.1)

## 2022-07-17 LAB — CBC WITH DIFFERENTIAL (CANCER CENTER ONLY)
Abs Immature Granulocytes: 0.01 10*3/uL (ref 0.00–0.07)
Basophils Absolute: 0 10*3/uL (ref 0.0–0.1)
Basophils Relative: 0 %
Eosinophils Absolute: 0 10*3/uL (ref 0.0–0.5)
Eosinophils Relative: 0 %
HCT: 32.4 % — ABNORMAL LOW (ref 36.0–46.0)
Hemoglobin: 10.9 g/dL — ABNORMAL LOW (ref 12.0–15.0)
Immature Granulocytes: 0 %
Lymphocytes Relative: 38 %
Lymphs Abs: 2.6 10*3/uL (ref 0.7–4.0)
MCH: 32.8 pg (ref 26.0–34.0)
MCHC: 33.6 g/dL (ref 30.0–36.0)
MCV: 97.6 fL (ref 80.0–100.0)
Monocytes Absolute: 0.7 10*3/uL (ref 0.1–1.0)
Monocytes Relative: 10 %
Neutro Abs: 3.5 10*3/uL (ref 1.7–7.7)
Neutrophils Relative %: 52 %
Platelet Count: 241 10*3/uL (ref 150–400)
RBC: 3.32 MIL/uL — ABNORMAL LOW (ref 3.87–5.11)
RDW: 16 % — ABNORMAL HIGH (ref 11.5–15.5)
WBC Count: 6.8 10*3/uL (ref 4.0–10.5)
nRBC: 0 % (ref 0.0–0.2)

## 2022-07-17 MED ORDER — TRASTUZUMAB-ANNS CHEMO 150 MG IV SOLR
6.0000 mg/kg | Freq: Once | INTRAVENOUS | Status: AC
  Administered 2022-07-17: 504 mg via INTRAVENOUS
  Filled 2022-07-17: qty 24

## 2022-07-17 MED ORDER — SODIUM CHLORIDE 0.9 % IV SOLN
739.2000 mg | Freq: Once | INTRAVENOUS | Status: AC
  Administered 2022-07-17: 740 mg via INTRAVENOUS
  Filled 2022-07-17: qty 74

## 2022-07-17 MED ORDER — SODIUM CHLORIDE 0.9% FLUSH
10.0000 mL | INTRAVENOUS | Status: DC | PRN
  Administered 2022-07-17: 10 mL

## 2022-07-17 MED ORDER — HEPARIN SOD (PORK) LOCK FLUSH 100 UNIT/ML IV SOLN
500.0000 [IU] | Freq: Once | INTRAVENOUS | Status: AC | PRN
  Administered 2022-07-17: 500 [IU]

## 2022-07-17 MED ORDER — PALONOSETRON HCL INJECTION 0.25 MG/5ML
0.2500 mg | Freq: Once | INTRAVENOUS | Status: AC
  Administered 2022-07-17: 0.25 mg via INTRAVENOUS
  Filled 2022-07-17: qty 5

## 2022-07-17 MED ORDER — SODIUM CHLORIDE 0.9 % IV SOLN
10.0000 mg | Freq: Once | INTRAVENOUS | Status: AC
  Administered 2022-07-17: 10 mg via INTRAVENOUS
  Filled 2022-07-17: qty 10

## 2022-07-17 MED ORDER — SODIUM CHLORIDE 0.9 % IV SOLN: Freq: Once | INTRAVENOUS | Status: AC

## 2022-07-17 MED ORDER — PACLITAXEL PROTEIN-BOUND CHEMO INJECTION 100 MG
260.0000 mg/m2 | Freq: Once | INTRAVENOUS | Status: AC
  Administered 2022-07-17: 500 mg via INTRAVENOUS
  Filled 2022-07-17: qty 100

## 2022-07-17 MED ORDER — SODIUM CHLORIDE 0.9 % IV SOLN
420.0000 mg | Freq: Once | INTRAVENOUS | Status: AC
  Administered 2022-07-17: 420 mg via INTRAVENOUS
  Filled 2022-07-17: qty 14

## 2022-07-17 MED ORDER — SODIUM CHLORIDE 0.9 % IV SOLN
150.0000 mg | Freq: Once | INTRAVENOUS | Status: AC
  Administered 2022-07-17: 150 mg via INTRAVENOUS
  Filled 2022-07-17: qty 150

## 2022-07-17 MED ORDER — ACETAMINOPHEN 325 MG PO TABS
650.0000 mg | ORAL_TABLET | Freq: Once | ORAL | Status: AC
  Administered 2022-07-17: 650 mg via ORAL
  Filled 2022-07-17: qty 2

## 2022-07-17 MED ORDER — DIPHENHYDRAMINE HCL 25 MG PO CAPS
50.0000 mg | ORAL_CAPSULE | Freq: Once | ORAL | Status: AC
  Administered 2022-07-17: 50 mg via ORAL
  Filled 2022-07-17: qty 2

## 2022-07-17 MED ORDER — SODIUM CHLORIDE 0.9% FLUSH
10.0000 mL | Freq: Once | INTRAVENOUS | Status: AC
  Administered 2022-07-17: 10 mL

## 2022-07-17 NOTE — Assessment & Plan Note (Signed)
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  Treatment plan: 1. Neoadjuvant chemotherapy with TCH Perjeta 6 cycles followed by Herceptin Perjeta maintenance versus Kadcyla maintenance (based on response to neoadjuvant chemo) for 1 year 2. Followed by breast conserving surgery if possible with sentinel lymph node study 3. Followed by adjuvant radiation therapy --------------------------------------------------------------------------------------------------------------------------------- Current treatment: Cycle 3 day 1 of Abraxane, Carbo, Herceptin, Perjeta   Chemo toxicities: Maculopapular rash-secondary to Taxotere, treatment changed to Abraxane; resolved with Steroid taper and Doxycycline Diarrhea: Imodium and Lomotil, none today Myalgias secondary to Neulasta Metallic taste in the mouth: cryotherapy during treatment--will try popsicle this time Alopecia prevention: dignicap--working well  RTC Saturday for injection and in 3 weeks for cycle 4 of treatment.  She knows to call if she develops any concerns between now and her next appointment.

## 2022-07-17 NOTE — Progress Notes (Signed)
[facility] Cancer Follow up:    Erika [name], [name], PA-C [address] [place] [id number]   DIAGNOSIS:  Cancer Staging  Malignant neoplasm of upper-outer quadrant of left breast in female, estrogen receptor negative Staging form: Breast, AJCC 8th Edition - Clinical stage from 05/21/2022: Stage IIB (cT2, cN1, cM0, G3, ER-, PR-, HER2+) - Signed by Serena CroissantGudena, Vinay, MD on 05/21/2022 Stage prefix: Initial diagnosis Histologic grading system: 3 grade system   SUMMARY OF ONCOLOGIC HISTORY: Oncology History  Malignant neoplasm of upper-outer quadrant of left breast in female, estrogen receptor negative  05/15/2022 Initial Diagnosis   Palpable mass in the left breast at 1 o'clock position measured 4.2 cm ultrasound-guided biopsy revealed 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  Report date is 05/29/2022.   The Multi-Cancer + RNA Panel offered by [facility] includes sequencing and/or deletion/duplication analysis of the following 70 genes:  AIP*, ALK, APC*, ATM*, AXIN2*, BAP1*, BARD1*, BLM*, BMPR1A*, BRCA1*, BRCA2*, BRIP1*, CDC73*, CDH1*, CDK4, CDKN1B*, CDKN2A, CHEK2*, CTNNA1*, DICER1*, EPCAM (del/dup only), EGFR, FH*, FLCN*, GREM1 (promoter dup only), HOXB13, KIT, LZTR1, MAX*, MBD4, MEN1*, MET, MITF, MLH1*, MSH2*, MSH3*, MSH6*, MUTYH*, NF1*, NF2*, NTHL1*, PALB2*, PDGFRA, PMS2*, POLD1*, POLE*, POT1*, PRKAR1A*, PTCH1*, PTEN*, RAD51C*, RAD51D*, RB1*, RET, SDHA* (sequencing only), SDHAF2*, SDHB*, SDHC*, SDHD*, SMAD4*, SMARCA4*, SMARCB1*, SMARCE1*, STK11*, SUFU*, TMEM127*, TP53*, TSC1*, TSC2*, VHL*. RNA  analysis is performed for * genes.   06/05/2022 -  Chemotherapy   Patient is on Treatment Plan : BREAST  Docetaxel + Carboplatin + Trastuzumab + Pertuzumab  (TCHP) q21d        CURRENT THERAPY: Abraxane, Carboplatin, Herceptin, Perjeta  INTERVAL HISTORY: Erika [name] 62 y.o. female returns for follow-up prior to receiving cycle 3 of Abraxane carboplatin Herceptin and Perjeta.  With cycle 2 of therapy she developed a diffuse maculopapular rash and this was treated with a steroid taper followed by doxycycline and the rash has resolved.  She has noted some taste changes and tried ice with her last treatment which did not significantly help.  She denies any other issues today--in particular peripheral neuropathy  Most recent echocardiogram occurred on May 22, 2022 demonstrating a left ventricular ejection fraction of 55 to 60%.     Patient Active Problem List   Diagnosis Date Noted   Port-A-Cath in place 06/03/2022   Genetic testing 05/30/2022   Therapeutic drug monitoring 05/27/2022   Malignant neoplasm of upper-outer quadrant of left breast in female, estrogen receptor negative 05/19/2022    is allergic to penicillins.  MEDICAL HISTORY: Past Medical History:  Diagnosis Date   Anxiety    Colitis 04/07/2008   seen on CT (at ER visit)   Depression    GERD (gastroesophageal reflux disease)    Hyperplastic colon polyp    Hypertension    Liver hemangioma 04/07/2008   seen on CT    SURGICAL HISTORY: Past Surgical History:  Procedure Laterality Date   "freezing of cervix"     ABDOMINAL HYSTERECTOMY     BREAST BIOPSY Left 05/15/2022   US LT BREAST BX W LOC DEV 1ST LESION IMG BX SPEC US GUIDE 05/15/2022  GI-BCG MAMMOGRAPHY   CESAREAN SECTION     NASAL SINUS SURGERY     PORTACATH PLACEMENT N/A 05/29/2022   Procedure: INSERTION PORT-A-CATH;  Surgeon: [name], MD;  Location: [facility];  Service: General;  Laterality: N/A;   right kneecap surgery +      SOCIAL  HISTORY: Social History   Socioeconomic History   Marital status: Married    Spouse name: Not on file   Number of children: 0   Years of education: Not on file   Highest education level: Not on file  Occupational History   Occupation: Real[employer]  Tobacco Use   Smoking status: Never   Smokeless tobacco: Never   Tobacco comments:    smoked 1 year while in college  Vaping Use   Vaping Use: Never used  Substance and Sexual Activity   Alcohol use: Yes    Alcohol/week: 14.0 standard drinks of alcohol    Types: 14 Glasses of wine per week    Comment: wine - daily   Drug use: No   Sexual activity: Not on file  Other Topics Concern   Not on file  Social History Narrative   1 caffeine drinks daily    Social Determinants of Health   Financial Resource Strain: Not on file  Food Insecurity: Not on file  Transportation Needs: Not on file  Physical Activity: Not on file  Stress: Not on file  Social Connections: Not on file  Intimate Partner Violence: Not on file    FAMILY HISTORY: Family History  Problem Relation Age of Onset   Kidney cancer Mother        dx after 30; early stage   Hypertension Father    Heart disease Father    Colon polyps Father    Lung cancer Father 26       smoking hx   Colon polyps Sister    Hypertension Brother    Cancer Maternal Uncle        ? blood cancer; dx after 50   Cancer Maternal Grandmother        ? gallbladder ? colon; d. after 60   Diabetes Paternal Grandmother     Review of Systems  Constitutional:  Negative for appetite change, chills, fatigue, fever and unexpected weight change.  HENT:   Negative for hearing loss, lump/mass, mouth sores and trouble swallowing.   Eyes:  Negative for eye problems and icterus.  Respiratory:  Negative for chest tightness, cough and shortness of breath.   Cardiovascular:  Negative for chest pain, leg swelling and palpitations.  Gastrointestinal:  Negative for abdominal distention, abdominal pain,  constipation, diarrhea, nausea and vomiting.  Endocrine: Negative for hot flashes.  Genitourinary:  Negative for difficulty urinating.   Musculoskeletal:  Negative for arthralgias.  Skin:  Negative for itching and rash.  Neurological:  Negative for dizziness, extremity weakness, headaches and numbness.  Hematological:  Negative for adenopathy. Does not bruise/bleed easily.  Psychiatric/Behavioral:  Negative for depression. The patient is not nervous/anxious.       PHYSICAL EXAMINATION   Onc Performance Status - 07/17/22 0948       ECOG Perf Status   ECOG Perf Status Restricted in physically strenuous activity but ambulatory and able to carry out work of a light or sedentary nature, e.g., light house work, office work      KPS SCALE   KPS % SCORE Able to carry on normal activity, minor s/s of disease  Vitals:   07/17/22 0946  BP: 123/66  Pulse: 96  Resp: 18  Temp: (!) 97.4 F (36.3 C)  SpO2: 97%    Physical Exam Constitutional:      General: She is not in acute distress.    Appearance: Normal appearance. She is not toxic-appearing.  HENT:     Head: Normocephalic and atraumatic.     Mouth/Throat:     Mouth: Mucous membranes are moist.     Pharynx: Oropharynx is clear. No oropharyngeal exudate or posterior oropharyngeal erythema.  Eyes:     General: No scleral icterus. Cardiovascular:     Rate and Rhythm: Normal rate and regular rhythm.     Pulses: Normal pulses.     Heart sounds: Normal heart sounds.  Pulmonary:     Effort: Pulmonary effort is normal.     Breath sounds: Normal breath sounds.  Abdominal:     General: Abdomen is flat. Bowel sounds are normal. There is no distension.     Palpations: Abdomen is soft.     Tenderness: There is no abdominal tenderness.  Musculoskeletal:        General: No swelling.     Cervical back: Neck supple.  Lymphadenopathy:     Cervical: No cervical adenopathy.  Skin:    General: Skin is warm and dry.      Findings: No rash.  Neurological:     General: No focal deficit present.     Mental Status: She is alert.  Psychiatric:        Mood and Affect: Mood normal.        Behavior: Behavior normal.     LABORATORY DATA:  CBC    Component Value Date/Time   WBC 6.8 07/17/2022 0919   WBC 13.5 (H) 01/21/2009 0826   RBC 3.32 (L) 07/17/2022 0919   HGB 10.9 (L) 07/17/2022 0919   HCT 32.4 (L) 07/17/2022 0919   PLT 241 07/17/2022 0919   MCV 97.6 07/17/2022 0919   MCH 32.8 07/17/2022 0919   MCHC 33.6 07/17/2022 0919   RDW 16.0 (H) 07/17/2022 0919   LYMPHSABS 2.6 07/17/2022 0919   MONOABS 0.7 07/17/2022 0919   EOSABS 0.0 07/17/2022 0919   BASOSABS 0.0 07/17/2022 0919    CMP     Component Value Date/Time   NA 140 07/17/2022 0919   K 3.7 07/17/2022 0919   CL 108 07/17/2022 0919   CO2 26 07/17/2022 0919   GLUCOSE 129 (H) 07/17/2022 0919   BUN 17 07/17/2022 0919   CREATININE 0.70 07/17/2022 0919   CALCIUM 9.2 07/17/2022 0919   PROT 6.3 (L) 07/17/2022 0919   ALBUMIN 4.0 07/17/2022 0919   AST 19 07/17/2022 0919   ALT 39 07/17/2022 0919   ALKPHOS 90 07/17/2022 0919   BILITOT 0.5 07/17/2022 0919   GFRNONAA >60 07/17/2022 0919   GFRAA  01/21/2009 0826    >60        The eGFR has been calculated using the MDRD equation. This calculation has not been validated in all clinical situations. eGFR's persistently <60 mL/min signify possible Chronic Kidney Disease.       ASSESSMENT and THERAPY PLAN:   Malignant neoplasm of upper-outer quadrant of left breast in female, estrogen receptor negative ([facility]) Erika [name] is a 62 year old woman with stage IIB ER/PR negative, HER2 positive breast cancer here today for evaluation and follow-up prior to receiving cycle 3 of neoadjuvant Abraxane, carboplatin, Herceptin, and Perjeta.  Treatment plan: 1. Neoadjuvant chemotherapy with TCH Perjeta 6 cycles followed  by Herceptin Perjeta maintenance versus Kadcyla maintenance (based on response to  neoadjuvant chemo) for 1 year 2. Followed by breast conserving surgery if possible with sentinel lymph node study 3. Followed by adjuvant radiation therapy --------------------------------------------------------------------------------------------------------------------------------- Current treatment: Cycle 3 day 1 of Abraxane, Carbo, Herceptin, Perjeta   Chemo toxicities: Maculopapular rash-secondary to Taxotere, treatment changed to Abraxane; resolved with Steroid taper and Doxycycline Diarrhea: Imodium and Lomotil, none today Myalgias secondary to Neulasta Metallic taste in the mouth: cryotherapy during treatment--will try popsicle this time Alopecia prevention: dignicap--working well  RTC Saturday for injection and in 3 weeks for cycle 4 of treatment.  She knows to call if she develops any concerns between now and her next appointment.  All questions were answered. The patient knows to call the clinic with any problems, questions or concerns. We can certainly see the patient much sooner if necessary.  Total encounter time:30 minutes*in face-to-face visit time, chart review, lab review, care coordination, order entry, and documentation of the encounter time.    Lillard Anes, NP 07/17/22 10:24 AM Medical Oncology and Hematology [facility] [address] [place], [place] [address] Tel. [phone]    Fax. [phone]  *Total Encounter Time as defined by the Centers for Medicare and Medicaid Services includes, in addition to the face-to-face time of a patient visit (documented in the note above) non-face-to-face time: obtaining and reviewing outside history, ordering and reviewing medications, tests or procedures, care coordination (communications with other health care professionals or caregivers) and documentation in the medical record.

## 2022-07-17 NOTE — Patient Instructions (Signed)
[place] [facility]  Discharge Instructions: Thank you for choosing [facility] to provide your oncology and hematology care.   If you have a lab appointment with the Cancer Center, please go directly to the Cancer Center and check in at the registration area.   Wear comfortable clothing and clothing appropriate for easy access to any Portacath or PICC line.   We strive to give you quality time with your provider. You may need to reschedule your appointment if you arrive late (15 or more minutes).  Arriving late affects you and other patients whose appointments are after yours.  Also, if you miss three or more appointments without notifying the office, you may be dismissed from the clinic at the provider's discretion.      For prescription refill requests, have your pharmacy contact our office and allow 72 hours for refills to be completed.    Today you received the following chemotherapy and/or immunotherapy agents Abraxane, Kanjinti, Perjeta, Carboplatin      To help prevent nausea and vomiting after your treatment, we encourage you to take your nausea medication as directed.  BELOW ARE SYMPTOMS THAT SHOULD BE REPORTED IMMEDIATELY: *FEVER GREATER THAN 100.4 F (38 C) OR HIGHER *CHILLS OR SWEATING *NAUSEA AND VOMITING THAT IS NOT CONTROLLED WITH YOUR NAUSEA MEDICATION *UNUSUAL SHORTNESS OF BREATH *UNUSUAL BRUISING OR BLEEDING *URINARY PROBLEMS (pain or burning when urinating, or frequent urination) *BOWEL PROBLEMS (unusual diarrhea, constipation, pain near the anus) TENDERNESS IN MOUTH AND THROAT WITH OR WITHOUT PRESENCE OF ULCERS (sore throat, sores in mouth, or a toothache) UNUSUAL RASH, SWELLING OR PAIN  UNUSUAL VAGINAL DISCHARGE OR ITCHING   Items with * indicate a potential emergency and should be followed up as soon as possible or go to the Emergency Department if any problems should occur.  Please show the CHEMOTHERAPY ALERT CARD or  IMMUNOTHERAPY ALERT CARD at check-in to the Emergency Department and triage nurse.  Should you have questions after your visit or need to cancel or reschedule your appointment, please contact [place] [facility]  Dept: [phone]  and follow the prompts.  Office hours are 8:00 a.m. to 4:30 p.m. Monday - Friday. Please note that voicemails left after 4:00 p.m. may not be returned until the following business day.  We are closed weekends and major holidays. You have access to a nurse at all times for urgent questions. Please call the main number to the clinic Dept: [phone] and follow the prompts.   For any non-urgent questions, you may also contact your provider using MyChart. We now offer e-Visits for anyone 82 and older to request care online for non-urgent symptoms. For details visit [email or web address].   Also download the MyChart app! Go to the app store, search "MyChart", open the app, select [place], and log in with your MyChart username and password.

## 2022-07-19 ENCOUNTER — Inpatient Hospital Stay: Payer: 59

## 2022-07-19 VITALS — BP 126/70 | HR 81 | Temp 97.3°F | Resp 20

## 2022-07-19 DIAGNOSIS — Z5112 Encounter for antineoplastic immunotherapy: Secondary | ICD-10-CM | POA: Diagnosis not present

## 2022-07-19 DIAGNOSIS — C50412 Malignant neoplasm of upper-outer quadrant of left female breast: Secondary | ICD-10-CM

## 2022-07-19 DIAGNOSIS — Z171 Estrogen receptor negative status [ER-]: Secondary | ICD-10-CM

## 2022-07-19 MED ORDER — PEGFILGRASTIM-CBQV 6 MG/0.6ML ~~LOC~~ SOSY
6.0000 mg | PREFILLED_SYRINGE | Freq: Once | SUBCUTANEOUS | Status: AC
  Administered 2022-07-19: 6 mg via SUBCUTANEOUS

## 2022-08-06 MED FILL — Fosaprepitant Dimeglumine For IV Infusion 150 MG (Base Eq): INTRAVENOUS | Qty: 5 | Status: AC

## 2022-08-06 MED FILL — Dexamethasone Sodium Phosphate Inj 100 MG/10ML: INTRAMUSCULAR | Qty: 1 | Status: AC

## 2022-08-07 ENCOUNTER — Inpatient Hospital Stay: Payer: 59

## 2022-08-07 ENCOUNTER — Encounter: Payer: Self-pay | Admitting: Adult Health

## 2022-08-07 ENCOUNTER — Inpatient Hospital Stay (HOSPITAL_BASED_OUTPATIENT_CLINIC_OR_DEPARTMENT_OTHER): Payer: 59 | Admitting: Adult Health

## 2022-08-07 ENCOUNTER — Inpatient Hospital Stay: Payer: 59 | Attending: Hematology and Oncology

## 2022-08-07 DIAGNOSIS — Z5189 Encounter for other specified aftercare: Secondary | ICD-10-CM | POA: Diagnosis not present

## 2022-08-07 DIAGNOSIS — Z87891 Personal history of nicotine dependence: Secondary | ICD-10-CM | POA: Diagnosis not present

## 2022-08-07 DIAGNOSIS — C50412 Malignant neoplasm of upper-outer quadrant of left female breast: Secondary | ICD-10-CM

## 2022-08-07 DIAGNOSIS — Z171 Estrogen receptor negative status [ER-]: Secondary | ICD-10-CM

## 2022-08-07 DIAGNOSIS — Z5112 Encounter for antineoplastic immunotherapy: Secondary | ICD-10-CM | POA: Diagnosis present

## 2022-08-07 DIAGNOSIS — Z95828 Presence of other vascular implants and grafts: Secondary | ICD-10-CM

## 2022-08-07 DIAGNOSIS — Z5111 Encounter for antineoplastic chemotherapy: Secondary | ICD-10-CM | POA: Insufficient documentation

## 2022-08-07 LAB — CBC WITH DIFFERENTIAL (CANCER CENTER ONLY)
Abs Immature Granulocytes: 0.02 10*3/uL (ref 0.00–0.07)
Basophils Absolute: 0 10*3/uL (ref 0.0–0.1)
Basophils Relative: 1 %
Eosinophils Absolute: 0.1 10*3/uL (ref 0.0–0.5)
Eosinophils Relative: 1 %
HCT: 27 % — ABNORMAL LOW (ref 36.0–46.0)
Hemoglobin: 9 g/dL — ABNORMAL LOW (ref 12.0–15.0)
Immature Granulocytes: 0 %
Lymphocytes Relative: 37 %
Lymphs Abs: 1.9 10*3/uL (ref 0.7–4.0)
MCH: 33.7 pg (ref 26.0–34.0)
MCHC: 33.3 g/dL (ref 30.0–36.0)
MCV: 101.1 fL — ABNORMAL HIGH (ref 80.0–100.0)
Monocytes Absolute: 0.6 10*3/uL (ref 0.1–1.0)
Monocytes Relative: 11 %
Neutro Abs: 2.6 10*3/uL (ref 1.7–7.7)
Neutrophils Relative %: 50 %
Platelet Count: 313 10*3/uL (ref 150–400)
RBC: 2.67 MIL/uL — ABNORMAL LOW (ref 3.87–5.11)
RDW: 17.5 % — ABNORMAL HIGH (ref 11.5–15.5)
WBC Count: 5.2 10*3/uL (ref 4.0–10.5)
nRBC: 0 % (ref 0.0–0.2)

## 2022-08-07 LAB — CMP (CANCER CENTER ONLY)
ALT: 28 U/L (ref 0–44)
AST: 23 U/L (ref 15–41)
Albumin: 3.9 g/dL (ref 3.5–5.0)
Alkaline Phosphatase: 106 U/L (ref 38–126)
Anion gap: 6 (ref 5–15)
BUN: 16 mg/dL (ref 6–20)
CO2: 27 mmol/L (ref 22–32)
Calcium: 8.8 mg/dL — ABNORMAL LOW (ref 8.9–10.3)
Chloride: 110 mmol/L (ref 98–111)
Creatinine: 0.76 mg/dL (ref 0.44–1.00)
GFR, Estimated: >60 mL/min (ref >60)
Glucose, Bld: 108 mg/dL — ABNORMAL HIGH (ref 70–99)
Potassium: 3.6 mmol/L (ref 3.5–5.1)
Sodium: 143 mmol/L (ref 135–145)
Total Bilirubin: 0.3 mg/dL (ref 0.3–1.2)
Total Protein: 6.2 g/dL — ABNORMAL LOW (ref 6.5–8.1)

## 2022-08-07 MED ORDER — SODIUM CHLORIDE 0.9 % IV SOLN: Freq: Once | INTRAVENOUS | Status: AC

## 2022-08-07 MED ORDER — PACLITAXEL PROTEIN-BOUND CHEMO INJECTION 100 MG
260.0000 mg/m2 | Freq: Once | INTRAVENOUS | Status: AC
  Administered 2022-08-07: 500 mg via INTRAVENOUS
  Filled 2022-08-07: qty 100

## 2022-08-07 MED ORDER — SODIUM CHLORIDE 0.9% FLUSH
10.0000 mL | INTRAVENOUS | Status: DC | PRN
  Administered 2022-08-07: 10 mL

## 2022-08-07 MED ORDER — SODIUM CHLORIDE 0.9 % IV SOLN
10.0000 mg | Freq: Once | INTRAVENOUS | Status: AC
  Administered 2022-08-07: 10 mg via INTRAVENOUS
  Filled 2022-08-07: qty 10

## 2022-08-07 MED ORDER — LORAZEPAM 0.5 MG PO TABS
0.5000 mg | ORAL_TABLET | Freq: Every day | ORAL | 0 refills | Status: DC | PRN
Start: 2022-08-07 — End: 2023-04-23

## 2022-08-07 MED ORDER — ACETAMINOPHEN 325 MG PO TABS
650.0000 mg | ORAL_TABLET | Freq: Once | ORAL | Status: AC
  Administered 2022-08-07: 650 mg via ORAL
  Filled 2022-08-07: qty 2

## 2022-08-07 MED ORDER — PALONOSETRON HCL INJECTION 0.25 MG/5ML
0.2500 mg | Freq: Once | INTRAVENOUS | Status: AC
  Administered 2022-08-07: 0.25 mg via INTRAVENOUS
  Filled 2022-08-07: qty 5

## 2022-08-07 MED ORDER — TRASTUZUMAB-ANNS CHEMO 150 MG IV SOLR
6.0000 mg/kg | Freq: Once | INTRAVENOUS | Status: AC
  Administered 2022-08-07: 504 mg via INTRAVENOUS
  Filled 2022-08-07: qty 24

## 2022-08-07 MED ORDER — SODIUM CHLORIDE 0.9 % IV SOLN
150.0000 mg | Freq: Once | INTRAVENOUS | Status: AC
  Administered 2022-08-07: 150 mg via INTRAVENOUS
  Filled 2022-08-07: qty 150

## 2022-08-07 MED ORDER — HEPARIN SOD (PORK) LOCK FLUSH 100 UNIT/ML IV SOLN
500.0000 [IU] | Freq: Once | INTRAVENOUS | Status: AC | PRN
  Administered 2022-08-07: 500 [IU]

## 2022-08-07 MED ORDER — SODIUM CHLORIDE 0.9% FLUSH
10.0000 mL | Freq: Once | INTRAVENOUS | Status: AC
  Administered 2022-08-07: 10 mL

## 2022-08-07 MED ORDER — SODIUM CHLORIDE 0.9 % IV SOLN
420.0000 mg | Freq: Once | INTRAVENOUS | Status: AC
  Administered 2022-08-07: 420 mg via INTRAVENOUS
  Filled 2022-08-07: qty 14

## 2022-08-07 MED ORDER — DIPHENHYDRAMINE HCL 25 MG PO CAPS
50.0000 mg | ORAL_CAPSULE | Freq: Once | ORAL | Status: AC
  Administered 2022-08-07: 50 mg via ORAL
  Filled 2022-08-07: qty 2

## 2022-08-07 MED ORDER — LORAZEPAM 2 MG/ML IJ SOLN
0.5000 mg | Freq: Once | INTRAMUSCULAR | Status: AC
  Administered 2022-08-07: 0.5 mg via INTRAVENOUS
  Filled 2022-08-07: qty 1

## 2022-08-07 MED ORDER — SODIUM CHLORIDE 0.9 % IV SOLN
739.2000 mg | Freq: Once | INTRAVENOUS | Status: AC
  Administered 2022-08-07: 740 mg via INTRAVENOUS
  Filled 2022-08-07: qty 74

## 2022-08-07 NOTE — Patient Instructions (Signed)
[place] [facility]  Discharge Instructions: Thank you for choosing [facility] to provide your oncology and hematology care.   If you have a lab appointment with the Cancer Center, please go directly to the Cancer Center and check in at the registration area.   Wear comfortable clothing and clothing appropriate for easy access to any Portacath or PICC line.   We strive to give you quality time with your provider. You may need to reschedule your appointment if you arrive late (15 or more minutes).  Arriving late affects you and other patients whose appointments are after yours.  Also, if you miss three or more appointments without notifying the office, you may be dismissed from the clinic at the provider's discretion.      For prescription refill requests, have your pharmacy contact our office and allow 72 hours for refills to be completed.    Today you received the following chemotherapy and/or immunotherapy agents herceptin, perjeta, abraxane, carboplatin      To help prevent nausea and vomiting after your treatment, we encourage you to take your nausea medication as directed.  BELOW ARE SYMPTOMS THAT SHOULD BE REPORTED IMMEDIATELY: *FEVER GREATER THAN 100.4 F (38 C) OR HIGHER *CHILLS OR SWEATING *NAUSEA AND VOMITING THAT IS NOT CONTROLLED WITH YOUR NAUSEA MEDICATION *UNUSUAL SHORTNESS OF BREATH *UNUSUAL BRUISING OR BLEEDING *URINARY PROBLEMS (pain or burning when urinating, or frequent urination) *BOWEL PROBLEMS (unusual diarrhea, constipation, pain near the anus) TENDERNESS IN MOUTH AND THROAT WITH OR WITHOUT PRESENCE OF ULCERS (sore throat, sores in mouth, or a toothache) UNUSUAL RASH, SWELLING OR PAIN  UNUSUAL VAGINAL DISCHARGE OR ITCHING   Items with * indicate a potential emergency and should be followed up as soon as possible or go to the Emergency Department if any problems should occur.  Please show the CHEMOTHERAPY ALERT CARD or  IMMUNOTHERAPY ALERT CARD at check-in to the Emergency Department and triage nurse.  Should you have questions after your visit or need to cancel or reschedule your appointment, please contact [place] [facility] AT [facility]  Dept: [phone]  and follow the prompts.  Office hours are 8:00 a.m. to 4:30 p.m. Monday - Friday. Please note that voicemails left after 4:00 p.m. may not be returned until the following business day.  We are closed weekends and major holidays. You have access to a nurse at all times for urgent questions. Please call the main number to the clinic Dept: [phone] and follow the prompts.   For any non-urgent questions, you may also contact your provider using MyChart. We now offer e-Visits for anyone 2 and older to request care online for non-urgent symptoms. For details visit [email or web address].   Also download the MyChart app! Go to the app store, search "MyChart", open the app, select [place], and log in with your MyChart username and password.

## 2022-08-07 NOTE — Assessment & Plan Note (Signed)
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  Treatment plan: 1. Neoadjuvant chemotherapy with TCH Perjeta 6 cycles followed by Herceptin Perjeta maintenance versus Kadcyla maintenance (based on response to neoadjuvant chemo) for 1 year 2. Followed by breast conserving surgery if possible with sentinel lymph node study 3. Followed by adjuvant radiation therapy --------------------------------------------------------------------------------------------------------------------------------- Current treatment: Cycle 4 day 1 of Abraxane, Carbo, Herceptin, Perjeta   Chemo toxicities: Maculopapular rash-secondary to Taxotere, treatment changed to Abraxane; resolved. Diarrhea: Managed with Imodium and Lomotil--electrolytes are normal Myalgias secondary to Neulasta Metallic taste in the mouth: cryotherapy with popsicle Alopecia prevention: dignicap  Reviewed her treatment plan in detail.  I placed orders for repeat echocardiogram to occur in the next several weeks.  RTC Saturday for injection and in 3 weeks for cycle 5 of treatment.  She knows to call if she develops any concerns between now and her next appointment.

## 2022-08-07 NOTE — Progress Notes (Signed)
[facility] Cancer Follow up:    Erika Soho, PA-C [address] [place] [address]   DIAGNOSIS:  Cancer Staging  Malignant neoplasm of upper-outer quadrant of left breast in female, estrogen receptor negative (HCC) Staging form: Breast, AJCC 8th Edition - Clinical stage from 05/21/2022: Stage IIB (cT2, cN1, cM0, G3, ER-, PR-, HER2+) - Signed by [name], MD on 05/21/2022 Stage prefix: Initial diagnosis Histologic grading system: 3 grade system   SUMMARY OF ONCOLOGIC HISTORY: Oncology History  Malignant neoplasm of upper-outer quadrant of left breast in female, estrogen receptor negative (HCC)  05/15/2022 Initial Diagnosis   Palpable mass in the left breast at 1 o'clock position measured 4.2 cm ultrasound-guided biopsy revealed grade 3 IDC ER/PR negative HER2 positive Ki-67 35%, left axillary lymph node: Biopsy positive   05/21/2022 Cancer Staging   Staging form: Breast, AJCC 8th Edition - Clinical stage from 05/21/2022: Stage IIB (cT2, cN1, cM0, G3, ER-, PR-, HER2+) - Signed by [name], MD on 05/21/2022 Stage prefix: Initial diagnosis Histologic grading system: 3 grade system   05/29/2022 Genetic Testing   Negative [facility] Multi-Cancer +RNA Panel.  Report date is 05/29/2022.   The Multi-Cancer + RNA Panel offered by [facility] includes sequencing and/or deletion/duplication analysis of the following 70 genes:  AIP*, ALK, APC*, ATM*, AXIN2*, BAP1*, BARD1*, BLM*, BMPR1A*, BRCA1*, BRCA2*, BRIP1*, CDC73*, CDH1*, CDK4, CDKN1B*, CDKN2A, CHEK2*, CTNNA1*, DICER1*, EPCAM (del/dup only), EGFR, FH*, FLCN*, GREM1 (promoter dup only), HOXB13, KIT, LZTR1, MAX*, MBD4, MEN1*, MET, MITF, MLH1*, MSH2*, MSH3*, MSH6*, MUTYH*, NF1*, NF2*, NTHL1*, PALB2*, PDGFRA, PMS2*, POLD1*, POLE*, POT1*, PRKAR1A*, PTCH1*, PTEN*, RAD51C*, RAD51D*, RB1*, RET, SDHA* (sequencing only), SDHAF2*, SDHB*, SDHC*, SDHD*, SMAD4*, SMARCA4*, SMARCB1*, SMARCE1*, STK11*, SUFU*, TMEM127*, TP53*, TSC1*,  TSC2*, VHL*. RNA analysis is performed for * genes.   06/05/2022 -  Chemotherapy   Patient is on Treatment Plan : BREAST  Docetaxel + Carboplatin + Trastuzumab + Pertuzumab  (TCHP) q21d        CURRENT THERAPY: TCHP  INTERVAL HISTORY: Erika [name] 62 y.o. female returns for f/u and evaluation prior to receiving neoadjuvant Abraxane, Carbo, Herceptin, Perjeta .  She is tolerating treatment well.  She is fatigued.  She also has diarrhea that she controls with Lomotil and Imodium along with the drink from [place] that she cannot recall the name of.  She denies peripheral neuropathy.  Her most recent echocardiogram occurred on May 22, 2022 demonstrating a left ventricular ejection fraction of 55 to 60% with a normal global longitudinal strain.   Patient Active Problem List   Diagnosis Date Noted   Port-A-Cath in place 06/03/2022   Genetic testing 05/30/2022   Therapeutic drug monitoring 05/27/2022   Malignant neoplasm of upper-outer quadrant of left breast in female, estrogen receptor negative (HCC) 05/19/2022    is allergic to penicillins.  MEDICAL HISTORY: Past Medical History:  Diagnosis Date   Anxiety    Colitis 04/07/2008   seen on CT (at ER visit)   Depression    GERD (gastroesophageal reflux disease)    Hyperplastic colon polyp    Hypertension    Liver hemangioma 04/07/2008   seen on CT    SURGICAL HISTORY: Past Surgical History:  Procedure Laterality Date   "freezing of cervix"     ABDOMINAL HYSTERECTOMY     BREAST BIOPSY Left 05/15/2022   [place] LT BREAST BX W LOC DEV 1ST LESION IMG BX SPEC US GUIDE 05/15/2022 [facility] MAMMOGRAPHY   CESAREAN SECTION     NASAL SINUS  SURGERY     PORTACATH PLACEMENT N/A 05/29/2022   Procedure: INSERTION PORT-A-CATH;  Surgeon: [name], MD;  Location: [facility];  Service: General;  Laterality: N/A;   right kneecap surgery +      SOCIAL HISTORY: Social History   Socioeconomic History   Marital status: Married    Spouse name:  Not on file   Number of children: 0   Years of education: Not on file   Highest education level: Not on file  Occupational History   Occupation: Real[employer]  Tobacco Use   Smoking status: Never   Smokeless tobacco: Never   Tobacco comments:    smoked 1 year while in college  Vaping Use   Vaping Use: Never used  Substance and Sexual Activity   Alcohol use: Yes    Alcohol/week: 14.0 standard drinks of alcohol    Types: 14 Glasses of wine per week    Comment: wine - daily   Drug use: No   Sexual activity: Not on file  Other Topics Concern   Not on file  Social History Narrative   1 caffeine drinks daily    Social Determinants of Health   Financial Resource Strain: Not on file  Food Insecurity: Not on file  Transportation Needs: Not on file  Physical Activity: Not on file  Stress: Not on file  Social Connections: Not on file  Intimate Partner Violence: Not on file    FAMILY HISTORY: Family History  Problem Relation Age of Onset   Kidney cancer Mother        dx after 62; early stage   Hypertension Father    Heart disease Father    Colon polyps Father    Lung cancer Father 27       smoking hx   Colon polyps Sister    Hypertension Brother    Cancer Maternal Uncle        ? blood cancer; dx after 50   Cancer Maternal Grandmother        ? gallbladder ? colon; d. after 60   Diabetes Paternal Grandmother     Review of Systems  Constitutional:  Positive for fatigue. Negative for appetite change, chills, fever and unexpected weight change.  HENT:   Negative for hearing loss, lump/mass and trouble swallowing.   Eyes:  Negative for eye problems and icterus.  Respiratory:  Negative for chest tightness, cough and shortness of breath.   Cardiovascular:  Negative for chest pain, leg swelling and palpitations.  Gastrointestinal:  Positive for diarrhea. Negative for abdominal distention, abdominal pain, constipation, nausea and vomiting.  Endocrine: Negative for hot flashes.   Genitourinary:  Negative for difficulty urinating.   Musculoskeletal:  Negative for arthralgias.  Skin:  Negative for itching and rash.  Neurological:  Negative for dizziness, extremity weakness, headaches and numbness.  Hematological:  Negative for adenopathy. Does not bruise/bleed easily.  Psychiatric/Behavioral:  Negative for depression. The patient is not nervous/anxious.       PHYSICAL EXAMINATION   Onc Performance Status - 08/07/22 0907       ECOG Perf Status   ECOG Perf Status Fully active, able to carry on all pre-disease performance without restriction      KPS SCALE   KPS % SCORE Able to carry on normal activity, minor s/s of disease             Vitals:   08/07/22 0902  BP: (!) 140/77  Pulse: 87  Resp: 18  Temp: (!) 97.3 F (36.3  C)  SpO2: 98%    Physical Exam Constitutional:      General: She is not in acute distress.    Appearance: Normal appearance. She is not toxic-appearing.  HENT:     Head: Normocephalic and atraumatic.  Eyes:     General: No scleral icterus. Cardiovascular:     Rate and Rhythm: Normal rate and regular rhythm.     Pulses: Normal pulses.     Heart sounds: Normal heart sounds.  Pulmonary:     Effort: Pulmonary effort is normal.     Breath sounds: Normal breath sounds.  Abdominal:     General: Abdomen is flat. Bowel sounds are normal. There is no distension.     Palpations: Abdomen is soft.     Tenderness: There is no abdominal tenderness.  Musculoskeletal:        General: No swelling.     Cervical back: Neck supple.  Lymphadenopathy:     Cervical: No cervical adenopathy.  Skin:    General: Skin is warm and dry.     Findings: No rash.  Neurological:     General: No focal deficit present.     Mental Status: She is alert.  Psychiatric:        Mood and Affect: Mood normal.        Behavior: Behavior normal.     LABORATORY DATA:  CBC    Component Value Date/Time   WBC 5.2 08/07/2022 0833   WBC 13.5 (H) 01/21/2009  0826   RBC 2.67 (L) 08/07/2022 0833   HGB 9.0 (L) 08/07/2022 0833   HCT 27.0 (L) 08/07/2022 0833   PLT 313 08/07/2022 0833   MCV 101.1 (H) 08/07/2022 0833   MCH 33.7 08/07/2022 0833   MCHC 33.3 08/07/2022 0833   RDW 17.5 (H) 08/07/2022 0833   LYMPHSABS 1.9 08/07/2022 0833   MONOABS 0.6 08/07/2022 0833   EOSABS 0.1 08/07/2022 0833   BASOSABS 0.0 08/07/2022 0833    CMP     Component Value Date/Time   NA 143 08/07/2022 0833   K 3.6 08/07/2022 0833   CL 110 08/07/2022 0833   CO2 27 08/07/2022 0833   GLUCOSE 108 (H) 08/07/2022 0833   BUN 16 08/07/2022 0833   CREATININE 0.76 08/07/2022 0833   CALCIUM 8.8 (L) 08/07/2022 0833   PROT 6.2 (L) 08/07/2022 0833   ALBUMIN 3.9 08/07/2022 0833   AST 23 08/07/2022 0833   ALT 28 08/07/2022 0833   ALKPHOS 106 08/07/2022 0833   BILITOT 0.3 08/07/2022 0833   GFRNONAA >60 08/07/2022 0833   GFRAA  01/21/2009 0826    >60        The eGFR has been calculated using the MDRD equation. This calculation has not been validated in all clinical situations. eGFR's persistently <60 mL/min signify possible Chronic Kidney Disease.      ASSESSMENT and THERAPY PLAN:   Malignant neoplasm of upper-outer quadrant of left breast in female, estrogen receptor negative (HCC) Laporche is a 62 year old woman with stage IIB ER/PR negative, HER2 positive breast cancer here today for evaluation and follow-up prior to receiving cycle 3 of neoadjuvant Abraxane, carboplatin, Herceptin, and Perjeta.  Treatment plan: 1. Neoadjuvant chemotherapy with TCH Perjeta 6 cycles followed by Herceptin Perjeta maintenance versus Kadcyla maintenance (based on response to neoadjuvant chemo) for 1 year 2. Followed by breast conserving surgery if possible with sentinel lymph node study 3. Followed by adjuvant radiation therapy --------------------------------------------------------------------------------------------------------------------------------- Current treatment: Cycle 4  day 1 of Abraxane, Carbo, Herceptin, Perjeta  Chemo toxicities: Maculopapular rash-secondary to Taxotere, treatment changed to Abraxane; resolved. Diarrhea: Managed with Imodium and Lomotil--electrolytes are normal Myalgias secondary to Neulasta Metallic taste in the mouth: cryotherapy with popsicle Alopecia prevention: dignicap  Reviewed her treatment plan in detail.  I placed orders for repeat echocardiogram to occur in the next several weeks.  RTC Saturday for injection and in 3 weeks for cycle 5 of treatment.  She knows to call if she develops any concerns between now and her next appointment.  All questions were answered. The patient knows to call the clinic with any problems, questions or concerns. We can certainly see the patient much sooner if necessary.  Total encounter time:30 minutes*in face-to-face visit time, chart review, lab review, care coordination, order entry, and documentation of the encounter time.    Lillard Anes, NP 08/07/22 9:54 AM Medical Oncology and Hematology [facility] [address] [place], [place] [address] Tel. [phone]    Fax. [phone]  *Total Encounter Time as defined by the Centers for Medicare and Medicaid Services includes, in addition to the face-to-face time of a patient visit (documented in the note above) non-face-to-face time: obtaining and reviewing outside history, ordering and reviewing medications, tests or procedures, care coordination (communications with other health care professionals or caregivers) and documentation in the medical record.

## 2022-08-08 ENCOUNTER — Encounter: Payer: Self-pay | Admitting: Adult Health

## 2022-08-09 ENCOUNTER — Inpatient Hospital Stay: Payer: 59

## 2022-08-09 ENCOUNTER — Other Ambulatory Visit: Payer: Self-pay

## 2022-08-09 VITALS — BP 136/74 | HR 66 | Temp 97.9°F | Resp 16

## 2022-08-09 DIAGNOSIS — Z171 Estrogen receptor negative status [ER-]: Secondary | ICD-10-CM

## 2022-08-09 DIAGNOSIS — Z5112 Encounter for antineoplastic immunotherapy: Secondary | ICD-10-CM | POA: Diagnosis not present

## 2022-08-09 MED ORDER — PEGFILGRASTIM-CBQV 6 MG/0.6ML ~~LOC~~ SOSY
6.0000 mg | PREFILLED_SYRINGE | Freq: Once | SUBCUTANEOUS | Status: AC
  Administered 2022-08-09: 6 mg via SUBCUTANEOUS

## 2022-08-12 ENCOUNTER — Telehealth: Payer: Self-pay | Admitting: *Deleted

## 2022-08-12 NOTE — Telephone Encounter (Signed)
Received call from pt with complaint of increased fatigue and requesting if there are any vitamins she could take.  Per MD pt can start multivitamin but fatigue is mainly related to tx.  RN encouraged pt to increase p.o fluid intake and contact our office if fatigue becomes worse.  Pt verbalized understanding.

## 2022-08-14 ENCOUNTER — Encounter: Payer: Self-pay | Admitting: Adult Health

## 2022-08-25 ENCOUNTER — Ambulatory Visit (HOSPITAL_COMMUNITY)
Admission: RE | Admit: 2022-08-25 | Discharge: 2022-08-25 | Disposition: A | Discharge status: Home / Self Care | Payer: 59 | Source: Ambulatory Visit | Attending: Adult Health | Admitting: Adult Health

## 2022-08-25 DIAGNOSIS — I351 Nonrheumatic aortic (valve) insufficiency: Secondary | ICD-10-CM | POA: Diagnosis not present

## 2022-08-25 DIAGNOSIS — Z5181 Encounter for therapeutic drug level monitoring: Secondary | ICD-10-CM | POA: Insufficient documentation

## 2022-08-25 DIAGNOSIS — Z7960 Long term (current) use of unspecified immunomodulators and immunosuppressants: Secondary | ICD-10-CM | POA: Diagnosis not present

## 2022-08-25 DIAGNOSIS — Z171 Estrogen receptor negative status [ER-]: Secondary | ICD-10-CM | POA: Insufficient documentation

## 2022-08-25 DIAGNOSIS — Z0189 Encounter for other specified special examinations: Secondary | ICD-10-CM | POA: Diagnosis not present

## 2022-08-25 DIAGNOSIS — C50412 Malignant neoplasm of upper-outer quadrant of left female breast: Secondary | ICD-10-CM | POA: Insufficient documentation

## 2022-08-25 LAB — ECHOCARDIOGRAM COMPLETE
AR max vel: 2.62 cm2
AV Area VTI: 2.43 cm2
AV Area mean vel: 2.6 cm2
AV Mean grad: 4 mmHg
AV Peak grad: 7.4 mmHg
Ao pk vel: 1.36 m/s
Area-P 1/2: 4.19 cm2
Calc EF: 60 %
S' Lateral: 3.35 cm
Single Plane A2C EF: 60.5 %
Single Plane A4C EF: 61.7 %

## 2022-08-25 NOTE — Progress Notes (Signed)
  Echocardiogram 2D Echocardiogram has been performed.  Erika [name] 08/25/2022, 11:41 AM

## 2022-08-27 ENCOUNTER — Encounter: Payer: Self-pay | Admitting: *Deleted

## 2022-08-27 ENCOUNTER — Other Ambulatory Visit: Payer: Self-pay | Admitting: *Deleted

## 2022-08-27 DIAGNOSIS — Z171 Estrogen receptor negative status [ER-]: Secondary | ICD-10-CM

## 2022-08-27 MED FILL — Fosaprepitant Dimeglumine For IV Infusion 150 MG (Base Eq): INTRAVENOUS | Qty: 5 | Status: AC

## 2022-08-27 MED FILL — Dexamethasone Sodium Phosphate Inj 100 MG/10ML: INTRAMUSCULAR | Qty: 1 | Status: AC

## 2022-08-28 ENCOUNTER — Inpatient Hospital Stay (HOSPITAL_BASED_OUTPATIENT_CLINIC_OR_DEPARTMENT_OTHER): Payer: 59 | Admitting: Adult Health

## 2022-08-28 ENCOUNTER — Encounter: Payer: Self-pay | Admitting: Adult Health

## 2022-08-28 ENCOUNTER — Inpatient Hospital Stay: Payer: 59

## 2022-08-28 ENCOUNTER — Telehealth: Payer: Self-pay | Admitting: *Deleted

## 2022-08-28 VITALS — BP 118/72 | HR 73 | Temp 97.9°F | Resp 16 | Wt 174.1 lb

## 2022-08-28 VITALS — BP 143/77 | HR 67 | Temp 97.6°F | Resp 18

## 2022-08-28 DIAGNOSIS — Z5112 Encounter for antineoplastic immunotherapy: Secondary | ICD-10-CM | POA: Diagnosis not present

## 2022-08-28 DIAGNOSIS — R300 Dysuria: Secondary | ICD-10-CM

## 2022-08-28 DIAGNOSIS — Z95828 Presence of other vascular implants and grafts: Secondary | ICD-10-CM

## 2022-08-28 DIAGNOSIS — Z171 Estrogen receptor negative status [ER-]: Secondary | ICD-10-CM

## 2022-08-28 DIAGNOSIS — C50412 Malignant neoplasm of upper-outer quadrant of left female breast: Secondary | ICD-10-CM

## 2022-08-28 LAB — CBC WITH DIFFERENTIAL (CANCER CENTER ONLY)
Abs Immature Granulocytes: 0.02 10*3/uL (ref 0.00–0.07)
Basophils Absolute: 0 10*3/uL (ref 0.0–0.1)
Basophils Relative: 0 %
Eosinophils Absolute: 0.2 10*3/uL (ref 0.0–0.5)
Eosinophils Relative: 3 %
HCT: 24.2 % — ABNORMAL LOW (ref 36.0–46.0)
Hemoglobin: 8 g/dL — ABNORMAL LOW (ref 12.0–15.0)
Immature Granulocytes: 0 %
Lymphocytes Relative: 37 %
Lymphs Abs: 2.2 10*3/uL (ref 0.7–4.0)
MCH: 34.3 pg — ABNORMAL HIGH (ref 26.0–34.0)
MCHC: 33.1 g/dL (ref 30.0–36.0)
MCV: 103.9 fL — ABNORMAL HIGH (ref 80.0–100.0)
Monocytes Absolute: 0.7 10*3/uL (ref 0.1–1.0)
Monocytes Relative: 12 %
Neutro Abs: 2.9 10*3/uL (ref 1.7–7.7)
Neutrophils Relative %: 48 %
Platelet Count: 156 10*3/uL (ref 150–400)
RBC: 2.33 MIL/uL — ABNORMAL LOW (ref 3.87–5.11)
RDW: 18.6 % — ABNORMAL HIGH (ref 11.5–15.5)
WBC Count: 6 10*3/uL (ref 4.0–10.5)
nRBC: 0 % (ref 0.0–0.2)

## 2022-08-28 LAB — URINALYSIS, COMPLETE (UACMP) WITH MICROSCOPIC
Bilirubin Urine: NEGATIVE
Glucose, UA: NEGATIVE mg/dL
Hgb urine dipstick: NEGATIVE
Ketones, ur: NEGATIVE mg/dL
Leukocytes,Ua: NEGATIVE
Nitrite: NEGATIVE
Protein, ur: NEGATIVE mg/dL
Specific Gravity, Urine: 1.006 (ref 1.005–1.030)
pH: 5 (ref 5.0–8.0)

## 2022-08-28 LAB — CMP (CANCER CENTER ONLY)
ALT: 18 U/L (ref 0–44)
AST: 16 U/L (ref 15–41)
Albumin: 3.7 g/dL (ref 3.5–5.0)
Alkaline Phosphatase: 90 U/L (ref 38–126)
Anion gap: 6 (ref 5–15)
BUN: 20 mg/dL (ref 6–20)
CO2: 26 mmol/L (ref 22–32)
Calcium: 8.3 mg/dL — ABNORMAL LOW (ref 8.9–10.3)
Chloride: 110 mmol/L (ref 98–111)
Creatinine: 0.76 mg/dL (ref 0.44–1.00)
GFR, Estimated: >60 mL/min (ref >60)
Glucose, Bld: 97 mg/dL (ref 70–99)
Potassium: 3.4 mmol/L — ABNORMAL LOW (ref 3.5–5.1)
Sodium: 142 mmol/L (ref 135–145)
Total Bilirubin: 0.3 mg/dL (ref 0.3–1.2)
Total Protein: 5.8 g/dL — ABNORMAL LOW (ref 6.5–8.1)

## 2022-08-28 MED ORDER — TRASTUZUMAB-ANNS CHEMO 150 MG IV SOLR
6.0000 mg/kg | Freq: Once | INTRAVENOUS | Status: AC
  Administered 2022-08-28: 504 mg via INTRAVENOUS
  Filled 2022-08-28: qty 24

## 2022-08-28 MED ORDER — ACETAMINOPHEN 325 MG PO TABS
650.0000 mg | ORAL_TABLET | Freq: Once | ORAL | Status: AC
  Administered 2022-08-28: 650 mg via ORAL
  Filled 2022-08-28: qty 2

## 2022-08-28 MED ORDER — PALONOSETRON HCL INJECTION 0.25 MG/5ML
0.2500 mg | Freq: Once | INTRAVENOUS | Status: AC
  Administered 2022-08-28: 0.25 mg via INTRAVENOUS
  Filled 2022-08-28: qty 5

## 2022-08-28 MED ORDER — SODIUM CHLORIDE 0.9 % IV SOLN
739.2000 mg | Freq: Once | INTRAVENOUS | Status: AC
  Administered 2022-08-28: 740 mg via INTRAVENOUS
  Filled 2022-08-28: qty 74

## 2022-08-28 MED ORDER — SODIUM CHLORIDE 0.9 % IV SOLN
150.0000 mg | Freq: Once | INTRAVENOUS | Status: AC
  Administered 2022-08-28: 150 mg via INTRAVENOUS
  Filled 2022-08-28: qty 150

## 2022-08-28 MED ORDER — SODIUM CHLORIDE 0.9 % IV SOLN
420.0000 mg | Freq: Once | INTRAVENOUS | Status: AC
  Administered 2022-08-28: 420 mg via INTRAVENOUS
  Filled 2022-08-28: qty 14

## 2022-08-28 MED ORDER — SODIUM CHLORIDE 0.9% FLUSH
10.0000 mL | Freq: Once | INTRAVENOUS | Status: AC
  Administered 2022-08-28: 10 mL

## 2022-08-28 MED ORDER — PACLITAXEL PROTEIN-BOUND CHEMO INJECTION 100 MG
260.0000 mg/m2 | Freq: Once | INTRAVENOUS | Status: AC
  Administered 2022-08-28: 500 mg via INTRAVENOUS
  Filled 2022-08-28: qty 100

## 2022-08-28 MED ORDER — CETIRIZINE HCL 10 MG/ML IV SOLN
10.0000 mg | Freq: Once | INTRAVENOUS | Status: AC
  Administered 2022-08-28: 10 mg via INTRAVENOUS
  Filled 2022-08-28: qty 1

## 2022-08-28 MED ORDER — LORAZEPAM 2 MG/ML IJ SOLN
0.5000 mg | Freq: Once | INTRAMUSCULAR | Status: AC
  Administered 2022-08-28: 0.5 mg via INTRAVENOUS
  Filled 2022-08-28: qty 1

## 2022-08-28 MED ORDER — SODIUM CHLORIDE 0.9 % IV SOLN: Freq: Once | INTRAVENOUS | Status: AC

## 2022-08-28 MED ORDER — SODIUM CHLORIDE 0.9 % IV SOLN
10.0000 mg | Freq: Once | INTRAVENOUS | Status: AC
  Administered 2022-08-28: 10 mg via INTRAVENOUS
  Filled 2022-08-28: qty 10

## 2022-08-28 MED ORDER — HEPARIN SOD (PORK) LOCK FLUSH 100 UNIT/ML IV SOLN
500.0000 [IU] | Freq: Once | INTRAVENOUS | Status: AC | PRN
  Administered 2022-08-28: 500 [IU]

## 2022-08-28 MED ORDER — SODIUM CHLORIDE 0.9% FLUSH
10.0000 mL | INTRAVENOUS | Status: DC | PRN
  Administered 2022-08-28: 10 mL

## 2022-08-28 NOTE — Patient Instructions (Signed)

## 2022-08-28 NOTE — Telephone Encounter (Signed)
-----   Message from Loa Socks, NP sent at 08/28/2022 10:13 AM EDT ----- Potassium slightly low, please recommend increased potassium in diet ----- Message ----- From: Leory Plowman, Lab In [place] Sent: 08/28/2022   9:06 AM EDT To: [name], MD

## 2022-08-28 NOTE — Progress Notes (Signed)
Cancer Center Cancer Follow up:    Erika Soho, PA-C [address] [place] [address]   DIAGNOSIS:  Cancer Staging  Malignant neoplasm of upper-outer quadrant of left breast in female, estrogen receptor negative (HCC) Staging form: Breast, AJCC 8th Edition - Clinical stage from 05/21/2022: Stage IIB (cT2, cN1, cM0, G3, ER-, PR-, HER2+) - Signed by [name], MD on 05/21/2022 Stage prefix: Initial diagnosis Histologic grading system: 3 grade system   SUMMARY OF ONCOLOGIC HISTORY: Oncology History  Malignant neoplasm of upper-outer quadrant of left breast in female, estrogen receptor negative (HCC)  05/15/2022 Initial Diagnosis   Palpable mass in the left breast at 1 o'clock position measured 4.2 cm ultrasound-guided biopsy revealed grade 3 IDC ER/PR negative HER2 positive Ki-67 35%, left axillary lymph node: Biopsy positive   05/21/2022 Cancer Staging   Staging form: Breast, AJCC 8th Edition - Clinical stage from 05/21/2022: Stage IIB (cT2, cN1, cM0, G3, ER-, PR-, HER2+) - Signed by [name], MD on 05/21/2022 Stage prefix: Initial diagnosis Histologic grading system: 3 grade system   05/29/2022 Genetic Testing   Negative [facility] Multi-Cancer +RNA Panel.  Report date is 05/29/2022.   The Multi-Cancer + RNA Panel offered by [facility] includes sequencing and/or deletion/duplication analysis of the following 70 genes:  AIP*, ALK, APC*, ATM*, AXIN2*, BAP1*, BARD1*, BLM*, BMPR1A*, BRCA1*, BRCA2*, BRIP1*, CDC73*, CDH1*, CDK4, CDKN1B*, CDKN2A, CHEK2*, CTNNA1*, DICER1*, EPCAM (del/dup only), EGFR, FH*, FLCN*, GREM1 (promoter dup only), HOXB13, KIT, LZTR1, MAX*, MBD4, MEN1*, MET, MITF, MLH1*, MSH2*, MSH3*, MSH6*, MUTYH*, NF1*, NF2*, NTHL1*, PALB2*, PDGFRA, PMS2*, POLD1*, POLE*, POT1*, PRKAR1A*, PTCH1*, PTEN*, RAD51C*, RAD51D*, RB1*, RET, SDHA* (sequencing only), SDHAF2*, SDHB*, SDHC*, SDHD*, SMAD4*, SMARCA4*, SMARCB1*, SMARCE1*, STK11*, SUFU*, TMEM127*, TP53*, TSC1*,  TSC2*, VHL*. RNA analysis is performed for * genes.   06/05/2022 -  Chemotherapy   Patient is on Treatment Plan : BREAST  Docetaxel + Carboplatin + Trastuzumab + Pertuzumab  (TCHP) q21d        CURRENT THERAPY: Erika [name], Herceptin, Perjeta  INTERVAL HISTORY: Erika [name] 62 y.o. female returns for follow-up prior to receiving treatment with Abraxane carbo Herceptin and Perjeta.  This will be her fifth cycle.  Her most recent echo occurred on 08/25/2022 and showed a LVEF 55-60%.    She notes after her most recent injection she experienced increased bone aches and pains.  She took Advil that helped alleviate her discomfort.  She has f/u scheduled with Dr. Donell Beers on June 24.  She is hoping her MRI will be completed before that time.    She notes dysuria when she initially urinates and then it resolves.  She also notes that she experienced brief mild tingling sensation in her fingertips after treatment, however it has resolved.     Patient Active Problem List   Diagnosis Date Noted   Port-A-Cath in place 06/03/2022   Genetic testing 05/30/2022   Therapeutic drug monitoring 05/27/2022   Malignant neoplasm of upper-outer quadrant of left breast in female, estrogen receptor negative (HCC) 05/19/2022    is allergic to penicillins.  MEDICAL HISTORY: Past Medical History:  Diagnosis Date   Anxiety    Colitis 04/07/2008   seen on CT (at ER visit)   Depression    GERD (gastroesophageal reflux disease)    Hyperplastic colon polyp    Hypertension    Liver hemangioma 04/07/2008   seen on CT    SURGICAL HISTORY: Past Surgical History:  Procedure Laterality Date   "freezing of cervix"  ABDOMINAL HYSTERECTOMY     BREAST BIOPSY Left 05/15/2022   [place] LT BREAST BX W LOC DEV 1ST LESION IMG BX SPEC US GUIDE 05/15/2022 [facility]MOGRAPHY   CESAREAN SECTION     NASAL SINUS SURGERY     PORTACATH PLACEMENT N/A 05/29/2022   Procedure: INSERTION PORT-A-CATH;  Surgeon: [name], MD;   Location: [facility];  Service: General;  Laterality: N/A;   right kneecap surgery +      SOCIAL HISTORY: Social History   Socioeconomic History   Marital status: Married    Spouse name: Not on file   Number of children: 0   Years of education: Not on file   Highest education level: Not on file  Occupational History   Occupation: Real[employer]  Tobacco Use   Smoking status: Never   Smokeless tobacco: Never   Tobacco comments:    smoked 1 year while in college  Vaping Use   Vaping Use: Never used  Substance and Sexual Activity   Alcohol use: Yes    Alcohol/week: 14.0 standard drinks of alcohol    Types: 14 Glasses of wine per week    Comment: wine - daily   Drug use: No   Sexual activity: Not on file  Other Topics Concern   Not on file  Social History Narrative   1 caffeine drinks daily    Social Determinants of Health   Financial Resource Strain: Not on file  Food Insecurity: Not on file  Transportation Needs: Not on file  Physical Activity: Not on file  Stress: Not on file  Social Connections: Not on file  Intimate Partner Violence: Not on file    FAMILY HISTORY: Family History  Problem Relation Age of Onset   Kidney cancer Mother        dx after 74; early stage   Hypertension Father    Heart disease Father    Colon polyps Father    Lung cancer Father 1       smoking hx   Colon polyps Sister    Hypertension Brother    Cancer Maternal Uncle        ? blood cancer; dx after 50   Cancer Maternal Grandmother        ? gallbladder ? colon; d. after 60   Diabetes Paternal Grandmother     Review of Systems  Constitutional:  Positive for fatigue. Negative for appetite change, chills, fever and unexpected weight change.  HENT:   Negative for hearing loss, lump/mass and trouble swallowing.   Eyes:  Negative for eye problems and icterus.  Respiratory:  Negative for chest tightness, cough and shortness of breath.   Cardiovascular:  Negative for chest pain, leg  swelling and palpitations.  Gastrointestinal:  Negative for abdominal distention, abdominal pain, constipation, diarrhea, nausea and vomiting.  Endocrine: Negative for hot flashes.  Genitourinary:  Negative for difficulty urinating.   Musculoskeletal:  Negative for arthralgias.  Skin:  Negative for itching and rash.  Neurological:  Negative for dizziness, extremity weakness, headaches and numbness.  Hematological:  Negative for adenopathy. Does not bruise/bleed easily.  Psychiatric/Behavioral:  Negative for depression. The patient is not nervous/anxious.       PHYSICAL EXAMINATION  Vitals:   08/28/22 0848  BP: 118/72  Pulse: 73  Resp: 16  Temp: 97.9 F (36.6 C)  SpO2: 100%    Physical Exam Constitutional:      General: She is not in acute distress.    Appearance: Normal appearance. She is not  toxic-appearing.  HENT:     Head: Normocephalic and atraumatic.     Mouth/Throat:     Mouth: Mucous membranes are moist.     Pharynx: Oropharynx is clear. No oropharyngeal exudate or posterior oropharyngeal erythema.  Eyes:     General: No scleral icterus. Cardiovascular:     Rate and Rhythm: Normal rate and regular rhythm.     Pulses: Normal pulses.     Heart sounds: Normal heart sounds.  Pulmonary:     Effort: Pulmonary effort is normal.     Breath sounds: Normal breath sounds.  Abdominal:     General: Abdomen is flat. Bowel sounds are normal. There is no distension.     Palpations: Abdomen is soft.     Tenderness: There is no abdominal tenderness.  Musculoskeletal:        General: No swelling.     Cervical back: Neck supple.  Lymphadenopathy:     Cervical: No cervical adenopathy.  Skin:    General: Skin is warm and dry.     Findings: No rash.  Neurological:     General: No focal deficit present.     Mental Status: She is alert.  Psychiatric:        Mood and Affect: Mood normal.        Behavior: Behavior normal.     LABORATORY DATA:  CBC    Component Value  Date/Time   WBC 6.0 08/28/2022 0806   WBC 13.5 (H) 01/21/2009 0826   RBC 2.33 (L) 08/28/2022 0806   HGB 8.0 (L) 08/28/2022 0806   HCT 24.2 (L) 08/28/2022 0806   PLT 156 08/28/2022 0806   MCV 103.9 (H) 08/28/2022 0806   MCH 34.3 (H) 08/28/2022 0806   MCHC 33.1 08/28/2022 0806   RDW 18.6 (H) 08/28/2022 0806   LYMPHSABS 2.2 08/28/2022 0806   MONOABS 0.7 08/28/2022 0806   EOSABS 0.2 08/28/2022 0806   BASOSABS 0.0 08/28/2022 0806    CMP     Component Value Date/Time   NA 143 08/07/2022 0833   K 3.6 08/07/2022 0833   CL 110 08/07/2022 0833   CO2 27 08/07/2022 0833   GLUCOSE 108 (H) 08/07/2022 0833   BUN 16 08/07/2022 0833   CREATININE 0.76 08/07/2022 0833   CALCIUM 8.8 (L) 08/07/2022 0833   PROT 6.2 (L) 08/07/2022 0833   ALBUMIN 3.9 08/07/2022 0833   AST 23 08/07/2022 0833   ALT 28 08/07/2022 0833   ALKPHOS 106 08/07/2022 0833   BILITOT 0.3 08/07/2022 0833   GFRNONAA >60 08/07/2022 0833   GFRAA  01/21/2009 0826    >60        The eGFR has been calculated using the MDRD equation. This calculation has not been validated in all clinical situations. eGFR's persistently <60 mL/min signify possible Chronic Kidney Disease.    ASSESSMENT and THERAPY PLAN:   Malignant neoplasm of upper-outer quadrant of left breast in female, estrogen receptor negative (HCC) Erika [name] is a 62 year old woman with stage IIB ER/PR negative, HER2 positive breast cancer here today for evaluation and follow-up prior to receiving cycle 5 of neoadjuvant Abraxane, carboplatin, Herceptin, and Perjeta.  Treatment plan: 1. Neoadjuvant chemotherapy with TCH Perjeta 6 cycles followed by Herceptin Perjeta maintenance versus Kadcyla maintenance (based on response to neoadjuvant chemo) for 1 year 2. Followed by breast conserving surgery if possible with sentinel lymph node study 3. Followed by adjuvant radiation  therapy --------------------------------------------------------------------------------------------------------------------------------- Current treatment: Cycle 5 day 1 of Abraxane, Carbo, Herceptin, Perjeta   Chemo  toxicities: Maculopapular rash-secondary to Taxotere, treatment changed to Abraxane; resolved. Diarrhea: Managed with Imodium and Lomotil--electrolytes are normal Myalgias secondary to Neulasta, will continue Advil with this next cycle Metallic taste in the mouth: cryotherapy with popsicle Alopecia prevention: dignicap At risk for HF: most recent echo on 08/27/2022 normal, next due in August Chemotherapy induced anemia: hemoglobin is 8 today.  She will return in 1-2 weeks for lab, f/u, and possible transfusion.  Handout given about anemia and reasons to call/return prior to her f/u with [place].   Dysuria: obtaining urinalysis and urine culture to evaluate  Her follow-up MRI has been ordered.  I communicated with our nurse navigator Angie Fava regarding the need for this to be prior to her follow-up with Dr. Donell Beers so that she can have everything scheduled in the way and hopes to be able to go to the beach for a week this summer.  RTC Saturday for injection and in 3 weeks for cycle 6 of treatment.  She knows to call if she develops any concerns between now and her next appointment.   All questions were answered. The patient knows to call the clinic with any problems, questions or concerns. We can certainly see the patient much sooner if necessary.  Total encounter time:30 minutes*in face-to-face visit time, chart review, lab review, care coordination, order entry, and documentation of the encounter time.    Lillard Anes, NP 08/28/22 9:33 AM Medical Oncology and Hematology [facility] [address] [place], [place] [address] Tel. [phone]    Fax. [phone]  *Total Encounter Time as defined by the Centers for Medicare and Medicaid Services includes, in  addition to the face-to-face time of a patient visit (documented in the note above) non-face-to-face time: obtaining and reviewing outside history, ordering and reviewing medications, tests or procedures, care coordination (communications with other health care professionals or caregivers) and documentation in the medical record.

## 2022-08-28 NOTE — Telephone Encounter (Signed)
Per Lillard Anes, NP, called pt with message below. Left message on pt personal vmail. Advised to call for any other concerns as we will f/u after the urine culture returns

## 2022-08-28 NOTE — Assessment & Plan Note (Addendum)
Erika [name] is a 62 year old woman with stage IIB ER/PR negative, HER2 positive breast cancer here today for evaluation and follow-up prior to receiving cycle 5 of neoadjuvant Abraxane, carboplatin, Herceptin, and Perjeta.  Treatment plan: 1. Neoadjuvant chemotherapy with TCH Perjeta 6 cycles followed by Herceptin Perjeta maintenance versus Kadcyla maintenance (based on response to neoadjuvant chemo) for 1 year 2. Followed by breast conserving surgery if possible with sentinel lymph node study 3. Followed by adjuvant radiation therapy --------------------------------------------------------------------------------------------------------------------------------- Current treatment: Cycle 5 day 1 of Abraxane, Carbo, Herceptin, Perjeta   Chemo toxicities: Maculopapular rash-secondary to Taxotere, treatment changed to Abraxane; resolved. Diarrhea: Managed with Imodium and Lomotil--electrolytes are normal Myalgias secondary to Neulasta, will continue Advil with this next cycle Metallic taste in the mouth: cryotherapy with popsicle Alopecia prevention: dignicap At risk for HF: most recent echo on 08/27/2022 normal, next due in August Chemotherapy induced anemia: hemoglobin is 8 today.  She will return in 1-2 weeks for lab, f/u, and possible transfusion.  Handout given about anemia and reasons to call/return prior to her f/u with [place].   Dysuria: obtaining urinalysis and urine culture to evaluate  Her follow-up MRI has been ordered.  I communicated with our nurse navigator Angie Fava regarding the need for this to be prior to her follow-up with Dr. Donell Beers so that she can have everything scheduled in the way and hopes to be able to go to the beach for a week this summer.  RTC Saturday for injection and in 3 weeks for cycle 6 of treatment.  She knows to call if she develops any concerns between now and her next appointment.

## 2022-08-28 NOTE — Patient Instructions (Addendum)
[place] [facility]  Discharge Instructions: Thank you for choosing [facility] to provide your oncology and hematology care.   If you have a lab appointment with the Cancer Center, please go directly to the Cancer Center and check in at the registration area.   Wear comfortable clothing and clothing appropriate for easy access to any Portacath or PICC line.   We strive to give you quality time with your provider. You may need to reschedule your appointment if you arrive late (15 or more minutes).  Arriving late affects you and other patients whose appointments are after yours.  Also, if you miss three or more appointments without notifying the office, you may be dismissed from the clinic at the provider's discretion.      For prescription refill requests, have your pharmacy contact our office and allow 72 hours for refills to be completed.    Today you received the following chemotherapy and/or immunotherapy agents: Trastuzumab (Kanjinti), Pertuzumab (Perjeta), Paclitaxel-protein bound (Abraxane), and Carboplatin.   To help prevent nausea and vomiting after your treatment, we encourage you to take your nausea medication as directed.  BELOW ARE SYMPTOMS THAT SHOULD BE REPORTED IMMEDIATELY: *FEVER GREATER THAN 100.4 F (38 C) OR HIGHER *CHILLS OR SWEATING *NAUSEA AND VOMITING THAT IS NOT CONTROLLED WITH YOUR NAUSEA MEDICATION *UNUSUAL SHORTNESS OF BREATH *UNUSUAL BRUISING OR BLEEDING *URINARY PROBLEMS (pain or burning when urinating, or frequent urination) *BOWEL PROBLEMS (unusual diarrhea, constipation, pain near the anus) TENDERNESS IN MOUTH AND THROAT WITH OR WITHOUT PRESENCE OF ULCERS (sore throat, sores in mouth, or a toothache) UNUSUAL RASH, SWELLING OR PAIN  UNUSUAL VAGINAL DISCHARGE OR ITCHING   Items with * indicate a potential emergency and should be followed up as soon as possible or go to the Emergency Department if any problems should  occur.  Please show the CHEMOTHERAPY ALERT CARD or IMMUNOTHERAPY ALERT CARD at check-in to the Emergency Department and triage nurse.  Should you have questions after your visit or need to cancel or reschedule your appointment, please contact [place] [facility]  Dept: [phone]  and follow the prompts.  Office hours are 8:00 a.m. to 4:30 p.m. Monday - Friday. Please note that voicemails left after 4:00 p.m. may not be returned until the following business day.  We are closed weekends and major holidays. You have access to a nurse at all times for urgent questions. Please call the main number to the clinic Dept: [phone] and follow the prompts.   For any non-urgent questions, you may also contact your provider using MyChart. We now offer e-Visits for anyone 59 and older to request care online for non-urgent symptoms. For details visit [email or web address].   Also download the MyChart app! Go to the app store, search "MyChart", open the app, select [facility], and log in with your MyChart username and password.  Hypokalemia Hypokalemia means that the amount of potassium in the blood is lower than normal. Potassium is a mineral (electrolyte) that helps regulate the amount of fluid in the body. It also stimulates muscle tightening (contraction) and helps nerves work properly. Normally, most of the body's potassium is inside cells, and only a very small amount is in the blood. Because the amount in the blood is so small, minor changes to potassium levels in the blood can be life-threatening. What are the causes? This condition may be caused by: Antibiotic medicine. Diarrhea or vomiting. Taking too much of a medicine  that helps you have a bowel movement (laxative) can cause diarrhea and lead to hypokalemia. Chronic kidney disease (CKD). Medicines that help the body get rid of excess fluid (diuretics). Eating disorders, such as anorexia or bulimia. Low magnesium  levels in the body. Sweating a lot. What are the signs or symptoms? Symptoms of this condition include: Weakness. Constipation. Fatigue. Muscle cramps. Mental confusion. Skipped heartbeats or irregular heartbeat (palpitations). Tingling or numbness. How is this diagnosed? This condition is diagnosed with a blood test. How is this treated? This condition may be treated by: Taking potassium supplements. Adjusting the medicines that you take. Eating more foods that contain a lot of potassium. If your potassium level is very low, you may need to get potassium through an IV and be monitored in the hospital. Follow these instructions at home: Eating and drinking  Eat a healthy diet. A healthy diet includes fresh fruits and vegetables, whole grains, healthy fats, and lean proteins. If told, eat more foods that contain a lot of potassium. These include: Nuts, such as peanuts and pistachios. Seeds, such as sunflower seeds and pumpkin seeds. Peas, lentils, and lima beans. Whole grain and bran cereals and breads. Fresh fruits and vegetables, such as apricots, avocado, bananas, cantaloupe, kiwi, oranges, tomatoes, asparagus, and potatoes. Juices, such as orange, tomato, and prune. Lean meats, including fish. Milk and milk products, such as yogurt. General instructions Take over-the-counter and prescription medicines only as told by your health care provider. This includes vitamins, natural food products, and supplements. Keep all follow-up visits. This is important. Contact a health care provider if: You have weakness that gets worse. You feel your heart pounding or racing. You vomit. You have diarrhea. You have diabetes and you have trouble keeping your blood sugar in your target range. Get help right away if: You have chest pain. You have shortness of breath. You have vomiting or diarrhea that lasts for more than 2 days. You faint. These symptoms may be an emergency. Get help  right away. Call 911. Do not wait to see if the symptoms will go away. Do not drive yourself to the hospital. Summary Hypokalemia means that the amount of potassium in the blood is lower than normal. This condition is diagnosed with a blood test. Hypokalemia may be treated by taking potassium supplements, adjusting the medicines that you take, or eating more foods that are high in potassium. If your potassium level is very low, you may need to get potassium through an IV and be monitored in the hospital. This information is not intended to replace advice given to you by your health care provider. Make sure you discuss any questions you have with your health care provider. Document Revised: 12/06/2020 Document Reviewed: 12/06/2020 Elsevier Patient Education  2023 [employer] Inc. Pancytopenia Pancytopenia is a condition in which a person has an abnormally low number (deficiency) of the following blood cells: Red blood cells (RBCs). Having too few RBCs is called anemia. White blood cells (WBCs). Having too few WBCs is called leukopenia. Cells that help the blood clot (platelets). Having too few platelets is called thrombocytopenia. Cells that become blood cells (stem cells) are made in the soft tissue inside the bones (bone marrow). All blood cells have a limited lifespan. Blood cells are constantly replaced with new blood cells from the bone marrow. Pancytopenia can be caused by any condition or disease that: Destroys the ability of bone marrow to make blood cells. Causes bone marrow to make blood cells that cannot survive after  they leave the bone marrow. What are the causes? There are many possible causes of this condition. In some cases, the cause is not known.  Common causes of this condition include: Megaloblastic anemia. This is a disease that causes bone marrow to make immature blood cells. Aplastic anemia, also called bone marrow failure. Aplastic anemia occurs when soft tissue inside of  bones (bone marrow) stops making enough blood cells. Hypersplenism, which is an enlarged spleen. An enlarged spleen can trap blood cells and destroy them faster than they can be replaced. Diseases of the blood or bone marrow that are passed from parent to child (inherited). Cancers that affect bone marrow. Certain medicines, such as: Chemotherapy. Medicines that reduce the activity of the immune system (immunosuppressant medicines). Exposure to radiation. Severe infections. What increases the risk? You are more likely to develop this condition if: You are female. You have a family history of a blood or bone marrow disease. You have certain conditions, such as: Alcohol use disorder. HIV (human immunodeficiency virus) or AIDS (acquired immunodeficiency syndrome). Cancer. Conditions in which the body's disease-fighting system attacks normal tissues (autoimmune diseases). What are the signs or symptoms? Symptoms of pancytopenia vary depending on the cause and may include the following: Symptoms of anemia Pale skin. Weakness. Shortness of breath. Dizziness. Symptoms of leukopenia Frequent infections. Fever. Night sweats. Tiredness (fatigue). Symptoms of thrombocytopenia Unusual bruising or bleeding. A rash that looks like pinpoint, purplish-red spots (petechiae) on the skin and mucous membranes. Other symptoms Bone pain. Weight loss. Headache. Feeling unusually cold. How is this diagnosed? This condition may be diagnosed based on: Your symptoms. Your medical history. A physical exam. Tests. These may include: Removal of a sample of bone marrow to be looked at under a microscope (biopsy). This is done by putting a needle into a bone to remove fluid and cells (aspiration). A complete blood count (CBC). This is a group of tests that measures characteristics of WBCs, RBCs, and platelets. A peripheral blood smear. This test examines your blood under a microscope to provide  information about medicines and diseases that affect RBCs, WBCs, and platelets. Reticulocyte count. This is a test that measures the number of new or immature RBCs (reticulocytes) that are made by your bone marrow. Imaging studies of your spleen or liver, such as X-rays. A test to measure your vitamin B12 level. Tests for viruses. How is this treated? Treatment for this condition depends on the cause. Treatment may include: Immunosuppressant medicines. Antibiotic medicine. Vitamin B12. This may be given as a treatment for megaloblastic anemia. Bone marrow-stimulating medicines. These are medicines that help the bone marrow make blood cells. Receiving donated blood through an IV (blood transfusion). A bone marrow transplant. A procedure to remove your spleen (splenectomy). This may be done as a treatment for hypersplenism. Follow these instructions at home: Caring for your body     Wash your hands often with soap and water for at least 20 seconds. If soap and water are not available, use hand sanitizer. Brush your teeth twice a day, and floss at least once a day. It is recommended that you visit the dentist every 6 months. Stay up to date on your vaccinations, including a yearly flu shot. Ask your health care provider which vaccines you should get. These may include a pneumonia vaccine. Medicines Take over-the-counter and prescription medicines only as told by your health care provider. If you were prescribed an antibiotic medicine, take it as told by your health care provider. Do not  stop taking the antibiotic even if you start to feel better. Lifestyle Do not take part in contact sports or dangerous activities. Ask your health care provider what activities are safe for you. During cold and flu season, avoid crowded places and avoid contact with people who are sick. Do not use any products that contain nicotine or tobacco. These products include cigarettes, chewing tobacco, and vaping  devices, such as e-cigarettes. If you need help quitting, ask your health care provider. General instructions Work with your health care provider to manage your condition and learn about your condition. Follow food safety recommendations as told by your health care provider. Keep all follow-up visits. This is important. Contact a health care provider if: You have a fever. You bruise or bleed easily. You are dizzy. You feel unusually weak or tired. Get help right away if: You have bleeding that does not stop. You are wheezing. This means making high-pitched whistling sounds when you breathe, most often when you breathe out. You have shortness of breath. You have chest pain. These symptoms may represent a serious problem that is an emergency. Do not wait to see if the symptoms will go away. Get medical help right away. Call your local emergency services (911 in the [place] Do not drive yourself to the hospital. Summary Pancytopenia is a condition in which a person has an abnormally low number of red blood cells, white blood cells, and platelets. There are many possible causes of this condition. Treatment for this condition depends on the cause. Work with your health care provider to manage your condition and learn about your condition. This information is not intended to replace advice given to you by your health care provider. Make sure you discuss any questions you have with your health care provider. Document Revised: 09/07/2020 Document Reviewed: 09/07/2020 Elsevier Patient Education  2024 Elsevier Inc. This video will expire on: 06/04/2024. If you need access to this video following this date, please reach out to the healthcare provider who assigned it to you. This information is not intended to replace advice given to you by your health care provider. Make sure you discuss any questions you have with your health care provider. Elsevier Patient Education  2024 [employer].

## 2022-08-28 NOTE — Telephone Encounter (Signed)
Per Lillard Anes, NP called pt with message below. Left vmail on pt personal cell. Advised to call with concerns.

## 2022-08-28 NOTE — Telephone Encounter (Addendum)
-----   Message from Loa Socks, NP sent at 08/28/2022  2:55 PM EDT ----- Please notify patient that her urine appears negative, we will await urine culture  ----- Message ----- From: Interface, Lab In [place] Sent: 08/28/2022  12:02 PM EDT To: Loa Socks, NP

## 2022-08-29 ENCOUNTER — Telehealth: Payer: Self-pay

## 2022-08-29 LAB — URINE CULTURE: Culture: 10000 — AB

## 2022-08-29 NOTE — Telephone Encounter (Signed)
This nurse reached out to patient related to urinalysis results.  Left a message for patient to return call to the clinic.  No further concerns at this time.

## 2022-08-29 NOTE — Telephone Encounter (Signed)
-----   Message from Loa Socks, NP sent at 08/29/2022  2:11 PM EDT ----- Urine growth is insignificant.  Please call patient and ask how she is doing regarding her urination. ----- Message ----- From: Leory Plowman, Lab In [place] Sent: 08/28/2022  12:02 PM EDT To: Loa Socks, NP

## 2022-08-30 ENCOUNTER — Inpatient Hospital Stay: Payer: 59

## 2022-08-30 ENCOUNTER — Other Ambulatory Visit: Payer: Self-pay

## 2022-08-30 VITALS — BP 124/78 | HR 78 | Temp 98.1°F | Resp 16

## 2022-08-30 DIAGNOSIS — C50412 Malignant neoplasm of upper-outer quadrant of left female breast: Secondary | ICD-10-CM

## 2022-08-30 DIAGNOSIS — Z5112 Encounter for antineoplastic immunotherapy: Secondary | ICD-10-CM | POA: Diagnosis not present

## 2022-08-30 MED ORDER — PEGFILGRASTIM-CBQV 6 MG/0.6ML ~~LOC~~ SOSY
6.0000 mg | PREFILLED_SYRINGE | Freq: Once | SUBCUTANEOUS | Status: AC
  Administered 2022-08-30: 6 mg via SUBCUTANEOUS

## 2022-08-30 NOTE — Patient Instructions (Signed)

## 2022-09-02 ENCOUNTER — Telehealth: Payer: Self-pay

## 2022-09-02 ENCOUNTER — Telehealth: Payer: Self-pay | Admitting: Adult Health

## 2022-09-02 NOTE — Telephone Encounter (Signed)
-----   Message from Lindsey Cornetto Causey, NP sent at 08/29/2022  2:11 PM EDT ----- Urine growth is insignificant.  Please call patient and ask how she is doing regarding her urination. ----- Message ----- From: Interface, Lab In [place] Sent: 08/28/2022  12:02 PM EDT To: Lindsey Cornetto Causey, NP   

## 2022-09-02 NOTE — Telephone Encounter (Signed)
Scheduled appointments per 5/24 los. Patient is aware of the made appointments. 

## 2022-09-02 NOTE — Telephone Encounter (Signed)
Called pt to see how urine symptoms was. Pt states that she had some stinging before treatment but has been symptom free for about a week now.  Pt denies burning, urgency, blood in urine and pain.  Informed pt to call back with any new symptoms. Pt verbalized understanding.

## 2022-09-02 NOTE — Telephone Encounter (Signed)
error 

## 2022-09-05 ENCOUNTER — Other Ambulatory Visit: Payer: Self-pay

## 2022-09-05 ENCOUNTER — Inpatient Hospital Stay (HOSPITAL_BASED_OUTPATIENT_CLINIC_OR_DEPARTMENT_OTHER): Payer: 59 | Admitting: Adult Health

## 2022-09-05 ENCOUNTER — Inpatient Hospital Stay: Payer: 59

## 2022-09-05 VITALS — BP 128/70 | HR 82 | Temp 97.5°F | Resp 18 | Wt 166.4 lb

## 2022-09-05 DIAGNOSIS — Z171 Estrogen receptor negative status [ER-]: Secondary | ICD-10-CM

## 2022-09-05 DIAGNOSIS — C50412 Malignant neoplasm of upper-outer quadrant of left female breast: Secondary | ICD-10-CM

## 2022-09-05 DIAGNOSIS — Z95828 Presence of other vascular implants and grafts: Secondary | ICD-10-CM

## 2022-09-05 DIAGNOSIS — Z5112 Encounter for antineoplastic immunotherapy: Secondary | ICD-10-CM | POA: Diagnosis not present

## 2022-09-05 LAB — CBC WITH DIFFERENTIAL (CANCER CENTER ONLY)
Abs Immature Granulocytes: 0.8 10*3/uL — ABNORMAL HIGH (ref 0.00–0.07)
Basophils Absolute: 0 10*3/uL (ref 0.0–0.1)
Basophils Relative: 0 %
Eosinophils Absolute: 0.1 10*3/uL (ref 0.0–0.5)
Eosinophils Relative: 1 %
HCT: 24.6 % — ABNORMAL LOW (ref 36.0–46.0)
Hemoglobin: 8.3 g/dL — ABNORMAL LOW (ref 12.0–15.0)
Immature Granulocytes: 6 %
Lymphocytes Relative: 22 %
Lymphs Abs: 3.1 10*3/uL (ref 0.7–4.0)
MCH: 35.8 pg — ABNORMAL HIGH (ref 26.0–34.0)
MCHC: 33.7 g/dL (ref 30.0–36.0)
MCV: 106 fL — ABNORMAL HIGH (ref 80.0–100.0)
Monocytes Absolute: 3.1 10*3/uL — ABNORMAL HIGH (ref 0.1–1.0)
Monocytes Relative: 23 %
Neutro Abs: 6.8 10*3/uL (ref 1.7–7.7)
Neutrophils Relative %: 48 %
Platelet Count: 164 10*3/uL (ref 150–400)
RBC: 2.32 MIL/uL — ABNORMAL LOW (ref 3.87–5.11)
RDW: 18.3 % — ABNORMAL HIGH (ref 11.5–15.5)
Smear Review: NORMAL
WBC Count: 14 10*3/uL — ABNORMAL HIGH (ref 4.0–10.5)
nRBC: 0.3 % — ABNORMAL HIGH (ref 0.0–0.2)

## 2022-09-05 LAB — CMP (CANCER CENTER ONLY)
ALT: 32 U/L (ref 0–44)
AST: 19 U/L (ref 15–41)
Albumin: 4.4 g/dL (ref 3.5–5.0)
Alkaline Phosphatase: 132 U/L — ABNORMAL HIGH (ref 38–126)
Anion gap: 8 (ref 5–15)
BUN: 13 mg/dL (ref 6–20)
CO2: 25 mmol/L (ref 22–32)
Calcium: 9.6 mg/dL (ref 8.9–10.3)
Chloride: 107 mmol/L (ref 98–111)
Creatinine: 0.8 mg/dL (ref 0.44–1.00)
GFR, Estimated: >60 mL/min (ref >60)
Glucose, Bld: 87 mg/dL (ref 70–99)
Potassium: 4.1 mmol/L (ref 3.5–5.1)
Sodium: 140 mmol/L (ref 135–145)
Total Bilirubin: 0.2 mg/dL — ABNORMAL LOW (ref 0.3–1.2)
Total Protein: 6.9 g/dL (ref 6.5–8.1)

## 2022-09-05 LAB — SAMPLE TO BLOOD BANK

## 2022-09-05 MED ORDER — SODIUM CHLORIDE 0.9% FLUSH
10.0000 mL | Freq: Once | INTRAVENOUS | Status: AC
  Administered 2022-09-05: 10 mL

## 2022-09-05 MED ORDER — HEPARIN SOD (PORK) LOCK FLUSH 100 UNIT/ML IV SOLN
500.0000 [IU] | Freq: Once | INTRAVENOUS | Status: AC
  Administered 2022-09-05: 500 [IU]

## 2022-09-05 NOTE — Progress Notes (Signed)
Orders entered for PRBC 1 unit per NP. To be transfused 09/09/22.

## 2022-09-05 NOTE — Assessment & Plan Note (Signed)
Erika [name] is a 62 year old woman with stage IIB ER/PR negative, HER2 positive breast cancer here today for evaluation and follow-up prior to receiving cycle 5 of neoadjuvant Abraxane, carboplatin, Herceptin, and Perjeta.  Treatment plan: 1. Neoadjuvant chemotherapy with TCH Perjeta 6 cycles followed by Herceptin Perjeta maintenance versus Kadcyla maintenance (based on response to neoadjuvant chemo) for 1 year 2. Followed by breast conserving surgery if possible with sentinel lymph node study 3. Followed by adjuvant radiation therapy --------------------------------------------------------------------------------------------------------------------------------- Current treatment: Cycle 5 day 1 of Abraxane, Carbo, Herceptin, Perjeta   Chemo toxicities: Maculopapular rash-secondary to Taxotere, treatment changed to Abraxane; resolved. Diarrhea: Managed with Imodium and Lomotil--electrolytes are normal Myalgias secondary to Neulasta, will continue Advil with this next cycle Metallic taste in the mouth: cryotherapy with popsicle Alopecia prevention: dignicap At risk for HF: most recent echo on 08/25/2022 normal, next due in August Chemotherapy induced anemia: hemoglobin is 8.3.  She will return on Tuesday, June 4 for 1 unit of blood.  We will repeat her labs at that time to evaluate for further decline in her hemoglobin. Fatigue secondary to chemotherapy.  Anemia is likely contributing.  Should improve somewhat with blood transfusion next week.  We discussed energy conservation.

## 2022-09-05 NOTE — Progress Notes (Signed)
[facility] Cancer Follow up:    Erika Croissant, MD [address] [place] [address]   DIAGNOSIS:  Cancer Staging  Malignant neoplasm of upper-outer quadrant of left breast in female, estrogen receptor negative (HCC) Staging form: Breast, AJCC 8th Edition - Clinical stage from 05/21/2022: Stage IIB (cT2, cN1, cM0, G3, ER-, PR-, HER2+) - Signed by Erika Croissant, MD on 05/21/2022 Stage prefix: Initial diagnosis Histologic grading system: 3 grade system   SUMMARY OF ONCOLOGIC HISTORY: Oncology History  Malignant neoplasm of upper-outer quadrant of left breast in female, estrogen receptor negative (HCC)  05/15/2022 Initial Diagnosis   Palpable mass in the left breast at 1 o'clock position measured 4.2 cm ultrasound-guided biopsy revealed grade 3 IDC ER/PR negative HER2 positive Ki-67 35%, left axillary lymph node: Biopsy positive   05/21/2022 Cancer Staging   Staging form: Breast, AJCC 8th Edition - Clinical stage from 05/21/2022: Stage IIB (cT2, cN1, cM0, G3, ER-, PR-, HER2+) - Signed by Erika Croissant, MD on 05/21/2022 Stage prefix: Initial diagnosis Histologic grading system: 3 grade system   05/29/2022 Genetic Testing   Negative [facility] Multi-Cancer +RNA Panel.  Report date is 05/29/2022.   The Multi-Cancer + RNA Panel offered by [facility] includes sequencing and/or deletion/duplication analysis of the following 70 genes:  AIP*, ALK, APC*, ATM*, AXIN2*, BAP1*, BARD1*, BLM*, BMPR1A*, BRCA1*, BRCA2*, BRIP1*, CDC73*, CDH1*, CDK4, CDKN1B*, CDKN2A, CHEK2*, CTNNA1*, DICER1*, EPCAM (del/dup only), EGFR, FH*, FLCN*, GREM1 (promoter dup only), HOXB13, KIT, LZTR1, MAX*, MBD4, MEN1*, MET, MITF, MLH1*, MSH2*, MSH3*, MSH6*, MUTYH*, NF1*, NF2*, NTHL1*, PALB2*, PDGFRA, PMS2*, POLD1*, POLE*, POT1*, PRKAR1A*, PTCH1*, PTEN*, RAD51C*, RAD51D*, RB1*, RET, SDHA* (sequencing only), SDHAF2*, SDHB*, SDHC*, SDHD*, SMAD4*, SMARCA4*, SMARCB1*, SMARCE1*, STK11*, SUFU*, TMEM127*, TP53*, TSC1*,  TSC2*, VHL*. RNA analysis is performed for * genes.   06/05/2022 -  Chemotherapy   Patient is on Treatment Plan : BREAST  Docetaxel + Carboplatin + Trastuzumab + Pertuzumab  (TCHP) q21d        CURRENT THERAPY: Abraxane, Carbo, Herceptin, Perjeta  INTERVAL HISTORY: Erika [name] 62 y.o. female returns for follow-up after receiving Abraxane carbo Herceptin and Perjeta.  She is increasingly fatigued and notes occasional dizziness with position changes.  Her hemoglobin last week was 8, today it is 8.3.  She has surgery coming up after her final cycle of chemotherapy which is scheduled for September 18, 2022.  She is scheduled to receive blood on June 4.  Her most recent echocardiogram occurred Aug 25, 2022 demonstrating a left ventricular ejection fraction of 55 to 60%.   Patient Active Problem List   Diagnosis Date Noted   Port-A-Cath in place 06/03/2022   Genetic testing 05/30/2022   Therapeutic drug monitoring 05/27/2022   Malignant neoplasm of upper-outer quadrant of left breast in female, estrogen receptor negative (HCC) 05/19/2022    is allergic to penicillins.  MEDICAL HISTORY: Past Medical History:  Diagnosis Date   Anxiety    Colitis 04/07/2008   seen on CT (at ER visit)   Depression    GERD (gastroesophageal reflux disease)    Hyperplastic colon polyp    Hypertension    Liver hemangioma 04/07/2008   seen on CT    SURGICAL HISTORY: Past Surgical History:  Procedure Laterality Date   "freezing of cervix"     ABDOMINAL HYSTERECTOMY     BREAST BIOPSY Left 05/15/2022   [place] LT BREAST BX W LOC DEV 1ST LESION IMG BX SPEC US GUIDE 05/15/2022 [facility] MAMMOGRAPHY   CESAREAN SECTION  NASAL SINUS SURGERY     PORTACATH PLACEMENT N/A 05/29/2022   Procedure: INSERTION PORT-A-CATH;  Surgeon: [name], MD;  Location: [facility];  Service: General;  Laterality: N/A;   right kneecap surgery +      SOCIAL HISTORY: Social History   Socioeconomic History   Marital status: Married     Spouse name: Not on file   Number of children: 0   Years of education: Not on file   Highest education level: Not on file  Occupational History   Occupation: Real[employer]  Tobacco Use   Smoking status: Never   Smokeless tobacco: Never   Tobacco comments:    smoked 1 year while in college  Vaping Use   Vaping Use: Never used  Substance and Sexual Activity   Alcohol use: Yes    Alcohol/week: 14.0 standard drinks of alcohol    Types: 14 Glasses of wine per week    Comment: wine - daily   Drug use: No   Sexual activity: Not on file  Other Topics Concern   Not on file  Social History Narrative   1 caffeine drinks daily    Social Determinants of Health   Financial Resource Strain: Not on file  Food Insecurity: Not on file  Transportation Needs: Not on file  Physical Activity: Not on file  Stress: Not on file  Social Connections: Not on file  Intimate Partner Violence: Not on file    FAMILY HISTORY: Family History  Problem Relation Age of Onset   Kidney cancer Mother        dx after 32; early stage   Hypertension Father    Heart disease Father    Colon polyps Father    Lung cancer Father 69       smoking hx   Colon polyps Sister    Hypertension Brother    Cancer Maternal Uncle        ? blood cancer; dx after 50   Cancer Maternal Grandmother        ? gallbladder ? colon; d. after 60   Diabetes Paternal Grandmother     Review of Systems  Constitutional:  Positive for fatigue. Negative for appetite change, chills, fever and unexpected weight change.  HENT:   Negative for hearing loss, lump/mass and trouble swallowing.   Eyes:  Negative for eye problems and icterus.  Respiratory:  Negative for chest tightness, cough and shortness of breath.   Cardiovascular:  Negative for chest pain, leg swelling and palpitations.  Gastrointestinal:  Negative for abdominal distention, abdominal pain, constipation, diarrhea, nausea and vomiting.  Endocrine: Negative for hot  flashes.  Genitourinary:  Negative for difficulty urinating.   Musculoskeletal:  Negative for arthralgias.  Skin:  Negative for itching and rash.  Neurological:  Positive for light-headedness. Negative for dizziness, extremity weakness, headaches and numbness.  Hematological:  Negative for adenopathy. Does not bruise/bleed easily.  Psychiatric/Behavioral:  Negative for depression. The patient is not nervous/anxious.       PHYSICAL EXAMINATION   Onc Performance Status - 09/05/22 1346       ECOG Perf Status   ECOG Perf Status Fully active, able to carry on all pre-disease performance without restriction      KPS SCALE   KPS % SCORE Able to carry on normal activity, minor s/s of disease             Vitals:   09/05/22 1331  BP: 128/70  Pulse: 82  Resp: 18  Temp: (!) 97.5  F (36.4 C)  SpO2: 99%    Physical Exam Constitutional:      General: She is not in acute distress.    Appearance: Normal appearance. She is not toxic-appearing.  HENT:     Head: Normocephalic and atraumatic.     Mouth/Throat:     Mouth: Mucous membranes are moist.     Pharynx: Oropharynx is clear. No oropharyngeal exudate or posterior oropharyngeal erythema.  Eyes:     General: No scleral icterus. Cardiovascular:     Rate and Rhythm: Normal rate and regular rhythm.     Pulses: Normal pulses.     Heart sounds: Normal heart sounds.  Pulmonary:     Effort: Pulmonary effort is normal.     Breath sounds: Normal breath sounds.  Abdominal:     General: Abdomen is flat. Bowel sounds are normal. There is no distension.     Palpations: Abdomen is soft.     Tenderness: There is no abdominal tenderness.  Musculoskeletal:        General: No swelling.     Cervical back: Neck supple.  Lymphadenopathy:     Cervical: No cervical adenopathy.  Skin:    General: Skin is warm and dry.     Findings: No rash.  Neurological:     General: No focal deficit present.     Mental Status: She is alert.   Psychiatric:        Mood and Affect: Mood normal.        Behavior: Behavior normal.     LABORATORY DATA:  CBC    Component Value Date/Time   WBC 14.0 (H) 09/05/2022 1308   WBC 13.5 (H) 01/21/2009 0826   RBC 2.32 (L) 09/05/2022 1308   HGB 8.3 (L) 09/05/2022 1308   HCT 24.6 (L) 09/05/2022 1308   PLT 164 09/05/2022 1308   MCV 106.0 (H) 09/05/2022 1308   MCH 35.8 (H) 09/05/2022 1308   MCHC 33.7 09/05/2022 1308   RDW 18.3 (H) 09/05/2022 1308   LYMPHSABS 3.1 09/05/2022 1308   MONOABS 3.1 (H) 09/05/2022 1308   EOSABS 0.1 09/05/2022 1308   BASOSABS 0.0 09/05/2022 1308    CMP     Component Value Date/Time   NA 140 09/05/2022 1308   K 4.1 09/05/2022 1308   CL 107 09/05/2022 1308   CO2 25 09/05/2022 1308   GLUCOSE 87 09/05/2022 1308   BUN 13 09/05/2022 1308   CREATININE 0.80 09/05/2022 1308   CALCIUM 9.6 09/05/2022 1308   PROT 6.9 09/05/2022 1308   ALBUMIN 4.4 09/05/2022 1308   AST 19 09/05/2022 1308   ALT 32 09/05/2022 1308   ALKPHOS 132 (H) 09/05/2022 1308   BILITOT 0.2 (L) 09/05/2022 1308   GFRNONAA >60 09/05/2022 1308   GFRAA  01/21/2009 0826    >60        The eGFR has been calculated using the MDRD equation. This calculation has not been validated in all clinical situations. eGFR's persistently <60 mL/min signify possible Chronic Kidney Disease.      ASSESSMENT and THERAPY PLAN:   Malignant neoplasm of upper-outer quadrant of left breast in female, estrogen receptor negative (HCC) Erika [name] is a 62 year old woman with stage IIB ER/PR negative, HER2 positive breast cancer here today for evaluation and follow-up prior to receiving cycle 5 of neoadjuvant Abraxane, carboplatin, Herceptin, and Perjeta.  Treatment plan: 1. Neoadjuvant chemotherapy with TCH Perjeta 6 cycles followed by Herceptin Perjeta maintenance versus Kadcyla maintenance (based on response to neoadjuvant chemo) for 1  year 2. Followed by breast conserving surgery if possible with sentinel lymph  node study 3. Followed by adjuvant radiation therapy --------------------------------------------------------------------------------------------------------------------------------- Current treatment: Cycle 5 day 1 of Abraxane, Carbo, Herceptin, Perjeta   Chemo toxicities: Maculopapular rash-secondary to Taxotere, treatment changed to Abraxane; resolved. Diarrhea: Managed with Imodium and Lomotil--electrolytes are normal Myalgias secondary to Neulasta, will continue Advil with this next cycle Metallic taste in the mouth: cryotherapy with popsicle Alopecia prevention: dignicap At risk for HF: most recent echo on 08/25/2022 normal, next due in August Chemotherapy induced anemia: hemoglobin is 8.3.  She will return on Tuesday, June 4 for 1 unit of blood.  We will repeat her labs at that time to evaluate for further decline in her hemoglobin. Fatigue secondary to chemotherapy.  Anemia is likely contributing.  Should improve somewhat with blood transfusion next week.  We discussed energy conservation.     All questions were answered. The patient knows to call the clinic with any problems, questions or concerns. We can certainly see the patient much sooner if necessary.  Total encounter time:30 minutes*in face-to-face visit time, chart review, lab review, care coordination, order entry, and documentation of the encounter time.  Lillard Anes, NP 09/05/22 2:12 PM Medical Oncology and Hematology [facility] [address] [place], [place] [address] Tel. [phone]    Fax. [phone]  *Total Encounter Time as defined by the Centers for Medicare and Medicaid Services includes, in addition to the face-to-face time of a patient visit (documented in the note above) non-face-to-face time: obtaining and reviewing outside history, ordering and reviewing medications, tests or procedures, care coordination (communications with other health care professionals or caregivers) and  documentation in the medical record.

## 2022-09-09 ENCOUNTER — Inpatient Hospital Stay: Payer: 59 | Attending: Hematology and Oncology

## 2022-09-09 ENCOUNTER — Encounter: Payer: Self-pay | Admitting: Adult Health

## 2022-09-09 ENCOUNTER — Other Ambulatory Visit: Payer: 59

## 2022-09-09 ENCOUNTER — Ambulatory Visit (HOSPITAL_BASED_OUTPATIENT_CLINIC_OR_DEPARTMENT_OTHER): Payer: 59 | Admitting: Adult Health

## 2022-09-09 ENCOUNTER — Inpatient Hospital Stay: Payer: 59

## 2022-09-09 ENCOUNTER — Other Ambulatory Visit: Payer: Self-pay | Admitting: *Deleted

## 2022-09-09 VITALS — BP 108/68 | HR 77 | Temp 98.2°F | Resp 18

## 2022-09-09 DIAGNOSIS — Z7982 Long term (current) use of aspirin: Secondary | ICD-10-CM | POA: Insufficient documentation

## 2022-09-09 DIAGNOSIS — Z801 Family history of malignant neoplasm of trachea, bronchus and lung: Secondary | ICD-10-CM | POA: Insufficient documentation

## 2022-09-09 DIAGNOSIS — C50412 Malignant neoplasm of upper-outer quadrant of left female breast: Secondary | ICD-10-CM | POA: Diagnosis present

## 2022-09-09 DIAGNOSIS — Z5112 Encounter for antineoplastic immunotherapy: Secondary | ICD-10-CM | POA: Diagnosis present

## 2022-09-09 DIAGNOSIS — Z5189 Encounter for other specified aftercare: Secondary | ICD-10-CM | POA: Insufficient documentation

## 2022-09-09 DIAGNOSIS — Z79899 Other long term (current) drug therapy: Secondary | ICD-10-CM | POA: Diagnosis not present

## 2022-09-09 DIAGNOSIS — Z171 Estrogen receptor negative status [ER-]: Secondary | ICD-10-CM | POA: Diagnosis not present

## 2022-09-09 DIAGNOSIS — Z5111 Encounter for antineoplastic chemotherapy: Secondary | ICD-10-CM | POA: Insufficient documentation

## 2022-09-09 DIAGNOSIS — T451X5A Adverse effect of antineoplastic and immunosuppressive drugs, initial encounter: Secondary | ICD-10-CM | POA: Diagnosis not present

## 2022-09-09 DIAGNOSIS — K1231 Oral mucositis (ulcerative) due to antineoplastic therapy: Secondary | ICD-10-CM | POA: Insufficient documentation

## 2022-09-09 DIAGNOSIS — Z95828 Presence of other vascular implants and grafts: Secondary | ICD-10-CM

## 2022-09-09 DIAGNOSIS — Z8051 Family history of malignant neoplasm of kidney: Secondary | ICD-10-CM | POA: Insufficient documentation

## 2022-09-09 DIAGNOSIS — D6481 Anemia due to antineoplastic chemotherapy: Secondary | ICD-10-CM | POA: Insufficient documentation

## 2022-09-09 LAB — CBC WITH DIFFERENTIAL (CANCER CENTER ONLY)
Abs Immature Granulocytes: 0.07 10*3/uL (ref 0.00–0.07)
Basophils Absolute: 0 10*3/uL (ref 0.0–0.1)
Basophils Relative: 0 %
Eosinophils Absolute: 0.1 10*3/uL (ref 0.0–0.5)
Eosinophils Relative: 1 %
HCT: 23.1 % — ABNORMAL LOW (ref 36.0–46.0)
Hemoglobin: 7.7 g/dL — ABNORMAL LOW (ref 12.0–15.0)
Immature Granulocytes: 1 %
Lymphocytes Relative: 28 %
Lymphs Abs: 2.8 10*3/uL (ref 0.7–4.0)
MCH: 35.2 pg — ABNORMAL HIGH (ref 26.0–34.0)
MCHC: 33.3 g/dL (ref 30.0–36.0)
MCV: 105.5 fL — ABNORMAL HIGH (ref 80.0–100.0)
Monocytes Absolute: 0.8 10*3/uL (ref 0.1–1.0)
Monocytes Relative: 8 %
Neutro Abs: 6.1 10*3/uL (ref 1.7–7.7)
Neutrophils Relative %: 62 %
Platelet Count: 141 10*3/uL — ABNORMAL LOW (ref 150–400)
RBC: 2.19 MIL/uL — ABNORMAL LOW (ref 3.87–5.11)
RDW: 18 % — ABNORMAL HIGH (ref 11.5–15.5)
WBC Count: 9.9 10*3/uL (ref 4.0–10.5)
nRBC: 0 % (ref 0.0–0.2)

## 2022-09-09 LAB — CMP (CANCER CENTER ONLY)
ALT: 20 U/L (ref 0–44)
AST: 14 U/L — ABNORMAL LOW (ref 15–41)
Albumin: 4.1 g/dL (ref 3.5–5.0)
Alkaline Phosphatase: 120 U/L (ref 38–126)
Anion gap: 8 (ref 5–15)
BUN: 15 mg/dL (ref 6–20)
CO2: 27 mmol/L (ref 22–32)
Calcium: 8.9 mg/dL (ref 8.9–10.3)
Chloride: 107 mmol/L (ref 98–111)
Creatinine: 0.87 mg/dL (ref 0.44–1.00)
GFR, Estimated: >60 mL/min (ref >60)
Glucose, Bld: 108 mg/dL — ABNORMAL HIGH (ref 70–99)
Potassium: 3.6 mmol/L (ref 3.5–5.1)
Sodium: 142 mmol/L (ref 135–145)
Total Bilirubin: 0.3 mg/dL (ref 0.3–1.2)
Total Protein: 6.6 g/dL (ref 6.5–8.1)

## 2022-09-09 LAB — URINALYSIS, COMPLETE (UACMP) WITH MICROSCOPIC
Bacteria, UA: NONE SEEN
Bilirubin Urine: NEGATIVE
Glucose, UA: NEGATIVE mg/dL
Hgb urine dipstick: NEGATIVE
Ketones, ur: NEGATIVE mg/dL
Nitrite: NEGATIVE
Protein, ur: NEGATIVE mg/dL
Specific Gravity, Urine: 1.013 (ref 1.005–1.030)
pH: 5 (ref 5.0–8.0)

## 2022-09-09 LAB — TYPE AND SCREEN: Unit division: 0

## 2022-09-09 LAB — PREPARE RBC (CROSSMATCH)

## 2022-09-09 LAB — TRANSFUSION REACTION: Post RXN DAT IgG: NEGATIVE

## 2022-09-09 LAB — BPAM RBC
Blood Product Expiration Date: 202407082359
Blood Product Expiration Date: 202407082359

## 2022-09-09 LAB — ABO/RH: ABO/RH(D): O POS

## 2022-09-09 MED ORDER — DIPHENHYDRAMINE HCL 25 MG PO CAPS
25.0000 mg | ORAL_CAPSULE | Freq: Once | ORAL | Status: AC
  Administered 2022-09-09: 25 mg via ORAL
  Filled 2022-09-09: qty 1

## 2022-09-09 MED ORDER — DIPHENHYDRAMINE HCL 50 MG/ML IJ SOLN
50.0000 mg | Freq: Once | INTRAMUSCULAR | Status: AC
  Administered 2022-09-09: 50 mg via INTRAVENOUS

## 2022-09-09 MED ORDER — FAMOTIDINE IN NACL 20-0.9 MG/50ML-% IV SOLN
20.0000 mg | Freq: Once | INTRAVENOUS | Status: AC
  Administered 2022-09-09: 20 mg via INTRAVENOUS

## 2022-09-09 MED ORDER — HEPARIN SOD (PORK) LOCK FLUSH 100 UNIT/ML IV SOLN
500.0000 [IU] | Freq: Every day | INTRAVENOUS | Status: AC | PRN
  Administered 2022-09-09: 500 [IU]

## 2022-09-09 MED ORDER — SODIUM CHLORIDE 0.9% FLUSH
10.0000 mL | INTRAVENOUS | Status: AC | PRN
  Administered 2022-09-09: 10 mL

## 2022-09-09 MED ORDER — SODIUM CHLORIDE 0.9 % IV SOLN: INTRAVENOUS | Status: DC

## 2022-09-09 MED ORDER — SODIUM CHLORIDE 0.9% IV SOLUTION
250.0000 mL | Freq: Once | INTRAVENOUS | Status: AC
  Administered 2022-09-09: 250 mL via INTRAVENOUS

## 2022-09-09 MED ORDER — SODIUM CHLORIDE 0.9% FLUSH
10.0000 mL | Freq: Once | INTRAVENOUS | Status: AC
  Administered 2022-09-09: 10 mL

## 2022-09-09 MED ORDER — ACETAMINOPHEN 325 MG PO TABS
650.0000 mg | ORAL_TABLET | Freq: Once | ORAL | Status: AC
  Administered 2022-09-09: 650 mg via ORAL
  Filled 2022-09-09: qty 2

## 2022-09-09 MED ORDER — METHYLPREDNISOLONE SODIUM SUCC 125 MG IJ SOLR
125.0000 mg | Freq: Once | INTRAMUSCULAR | Status: AC
  Administered 2022-09-09: 125 mg via INTRAVENOUS

## 2022-09-09 NOTE — Progress Notes (Signed)
Pt reported tongue swelling about 10 minutes into her blood transfusion (2nd unit). Infusion stopped. Normal Saline hung to gravity. 50mg  Benadryl, 125 mg solumedrol given in split doses. Pepcid 20 mg IVPB. Pt also had some shortness of breath and coughing while taking a deep breath. 1420 Pt reports that tongue is starting to feel smaller. 1450 coughing has subsided. VSS throughout. Blood bank contacted and protocol for transfusion reaction completed.

## 2022-09-09 NOTE — Progress Notes (Signed)
[facility] Cancer Follow up:    Erika Croissant, MD [address] [place] [address]   DIAGNOSIS:  Cancer Staging  Malignant neoplasm of upper-outer quadrant of left breast in female, estrogen receptor negative (HCC) Staging form: Breast, AJCC 8th Edition - Clinical stage from 05/21/2022: Stage IIB (cT2, cN1, cM0, G3, ER-, PR-, HER2+) - Signed by Erika Croissant, MD on 05/21/2022 Stage prefix: Initial diagnosis Histologic grading system: 3 grade system   SUMMARY OF ONCOLOGIC HISTORY: Oncology History  Malignant neoplasm of upper-outer quadrant of left breast in female, estrogen receptor negative (HCC)  05/15/2022 Initial Diagnosis   Palpable mass in the left breast at 1 o'clock position measured 4.2 cm ultrasound-guided biopsy revealed grade 3 IDC ER/PR negative HER2 positive Ki-67 35%, left axillary lymph node: Biopsy positive   05/21/2022 Cancer Staging   Staging form: Breast, AJCC 8th Edition - Clinical stage from 05/21/2022: Stage IIB (cT2, cN1, cM0, G3, ER-, PR-, HER2+) - Signed by Erika Croissant, MD on 05/21/2022 Stage prefix: Initial diagnosis Histologic grading system: 3 grade system   05/29/2022 Genetic Testing   Negative [facility] Multi-Cancer +RNA Panel.  Report date is 05/29/2022.   The Multi-Cancer + RNA Panel offered by [facility] includes sequencing and/or deletion/duplication analysis of the following 70 genes:  AIP*, ALK, APC*, ATM*, AXIN2*, BAP1*, BARD1*, BLM*, BMPR1A*, BRCA1*, BRCA2*, BRIP1*, CDC73*, CDH1*, CDK4, CDKN1B*, CDKN2A, CHEK2*, CTNNA1*, DICER1*, EPCAM (del/dup only), EGFR, FH*, FLCN*, GREM1 (promoter dup only), HOXB13, KIT, LZTR1, MAX*, MBD4, MEN1*, MET, MITF, MLH1*, MSH2*, MSH3*, MSH6*, MUTYH*, NF1*, NF2*, NTHL1*, PALB2*, PDGFRA, PMS2*, POLD1*, POLE*, POT1*, PRKAR1A*, PTCH1*, PTEN*, RAD51C*, RAD51D*, RB1*, RET, SDHA* (sequencing only), SDHAF2*, SDHB*, SDHC*, SDHD*, SMAD4*, SMARCA4*, SMARCB1*, SMARCE1*, STK11*, SUFU*, TMEM127*, TP53*, TSC1*,  TSC2*, VHL*. RNA analysis is performed for * genes.   06/05/2022 -  Chemotherapy   Patient is on Treatment Plan : BREAST  Docetaxel + Carboplatin + Trastuzumab + Pertuzumab  (TCHP) q21d        CURRENT THERAPY:  INTERVAL HISTORY: Erika [name] 62 y.o. female is being evaluated urgently in the infusion room for tongue swelling after starting on her second unit of PRBCs for her symptomatic anemia.  She denies shortness of breath.  She does cough some because she says her airway feels funny.  The nurses given her 125 mg of Solu-Medrol, 20 mg of IV Pepcid and she is currently receiving normal saline wide open.  Her vitals have remained stable, most recent 116/72, 100% on room air, heart rate 76, temp 97.8.   Patient Active Problem List   Diagnosis Date Noted   Port-A-Cath in place 06/03/2022   Genetic testing 05/30/2022   Therapeutic drug monitoring 05/27/2022   Malignant neoplasm of upper-outer quadrant of left breast in female, estrogen receptor negative (HCC) 05/19/2022    is allergic to penicillins.  MEDICAL HISTORY: Past Medical History:  Diagnosis Date   Anxiety    Colitis 04/07/2008   seen on CT (at ER visit)   Depression    GERD (gastroesophageal reflux disease)    Hyperplastic colon polyp    Hypertension    Liver hemangioma 04/07/2008   seen on CT    SURGICAL HISTORY: Past Surgical History:  Procedure Laterality Date   "freezing of cervix"     ABDOMINAL HYSTERECTOMY     BREAST BIOPSY Left 05/15/2022   [place] LT BREAST BX W LOC DEV 1ST LESION IMG BX SPEC US GUIDE 05/15/2022 [facility] MAMMOGRAPHY   CESAREAN SECTION     NASAL  SINUS SURGERY     PORTACATH PLACEMENT N/A 05/29/2022   Procedure: INSERTION PORT-A-CATH;  Surgeon: [name], MD;  Location: [facility];  Service: General;  Laterality: N/A;   right kneecap surgery +      SOCIAL HISTORY: Social History   Socioeconomic History   Marital status: Married    Spouse name: Not on file   Number of children: 0   Years  of education: Not on file   Highest education level: Not on file  Occupational History   Occupation: Real[employer]  Tobacco Use   Smoking status: Never   Smokeless tobacco: Never   Tobacco comments:    smoked 1 year while in college  Vaping Use   Vaping Use: Never used  Substance and Sexual Activity   Alcohol use: Yes    Alcohol/week: 14.0 standard drinks of alcohol    Types: 14 Glasses of wine per week    Comment: wine - daily   Drug use: No   Sexual activity: Not on file  Other Topics Concern   Not on file  Social History Narrative   1 caffeine drinks daily    Social Determinants of Health   Financial Resource Strain: Not on file  Food Insecurity: Not on file  Transportation Needs: Not on file  Physical Activity: Not on file  Stress: Not on file  Social Connections: Not on file  Intimate Partner Violence: Not on file    FAMILY HISTORY: Family History  Problem Relation Age of Onset   Kidney cancer Mother        dx after 37; early stage   Hypertension Father    Heart disease Father    Colon polyps Father    Lung cancer Father 52       smoking hx   Colon polyps Sister    Hypertension Brother    Cancer Maternal Uncle        ? blood cancer; dx after 50   Cancer Maternal Grandmother        ? gallbladder ? colon; d. after 60   Diabetes Paternal Grandmother     Review of Systems  Constitutional:  Positive for fatigue. Negative for appetite change, chills, fever and unexpected weight change.  HENT:   Negative for hearing loss, lump/mass and trouble swallowing.   Eyes:  Negative for eye problems and icterus.  Respiratory:  Negative for chest tightness, cough and shortness of breath.   Cardiovascular:  Negative for chest pain, leg swelling and palpitations.  Gastrointestinal:  Negative for abdominal distention, abdominal pain, constipation, diarrhea, nausea and vomiting.  Endocrine: Negative for hot flashes.  Genitourinary:  Negative for difficulty urinating.    Musculoskeletal:  Negative for arthralgias.  Skin:  Negative for itching and rash.  Neurological:  Negative for dizziness, extremity weakness, headaches and numbness.  Hematological:  Negative for adenopathy. Does not bruise/bleed easily.  Psychiatric/Behavioral:  Negative for depression. The patient is not nervous/anxious.       PHYSICAL EXAMINATION  Vitals obtained in the infusion room please see [facility] for detailed  Physical Exam Constitutional:      General: She is not in acute distress.    Appearance: Normal appearance. She is not toxic-appearing.  HENT:     Head: Normocephalic and atraumatic.     Mouth/Throat:     Mouth: Mucous membranes are moist.     Pharynx: Oropharynx is clear. No oropharyngeal exudate or posterior oropharyngeal erythema.     Comments: Her tongue does appear slightly swollen.  There is no stridor noted. Eyes:     General: No scleral icterus. Cardiovascular:     Rate and Rhythm: Normal rate and regular rhythm.     Pulses: Normal pulses.     Heart sounds: Normal heart sounds.  Pulmonary:     Effort: Pulmonary effort is normal.     Breath sounds: Normal breath sounds.  Abdominal:     General: Abdomen is flat. Bowel sounds are normal. There is no distension.     Palpations: Abdomen is soft.     Tenderness: There is no abdominal tenderness.  Musculoskeletal:        General: No swelling.     Cervical back: Neck supple.  Lymphadenopathy:     Cervical: No cervical adenopathy.  Skin:    General: Skin is warm and dry.     Findings: No rash.  Neurological:     General: No focal deficit present.     Mental Status: She is alert.  Psychiatric:        Mood and Affect: Mood normal.        Behavior: Behavior normal.     LABORATORY DATA:  CBC    Component Value Date/Time   WBC 9.9 09/09/2022 0744   WBC 13.5 (H) 01/21/2009 0826   RBC 2.19 (L) 09/09/2022 0744   HGB 7.7 (L) 09/09/2022 0744   HCT 23.1 (L) 09/09/2022 0744   PLT 141 (L) 09/09/2022 0744    MCV 105.5 (H) 09/09/2022 0744   MCH 35.2 (H) 09/09/2022 0744   MCHC 33.3 09/09/2022 0744   RDW 18.0 (H) 09/09/2022 0744   LYMPHSABS 2.8 09/09/2022 0744   MONOABS 0.8 09/09/2022 0744   EOSABS 0.1 09/09/2022 0744   BASOSABS 0.0 09/09/2022 0744    CMP     Component Value Date/Time   NA 142 09/09/2022 0744   K 3.6 09/09/2022 0744   CL 107 09/09/2022 0744   CO2 27 09/09/2022 0744   GLUCOSE 108 (H) 09/09/2022 0744   BUN 15 09/09/2022 0744   CREATININE 0.87 09/09/2022 0744   CALCIUM 8.9 09/09/2022 0744   PROT 6.6 09/09/2022 0744   ALBUMIN 4.1 09/09/2022 0744   AST 14 (L) 09/09/2022 0744   ALT 20 09/09/2022 0744   ALKPHOS 120 09/09/2022 0744   BILITOT 0.3 09/09/2022 0744   GFRNONAA >60 09/09/2022 0744   GFRAA  01/21/2009 0826    >60        The eGFR has been calculated using the MDRD equation. This calculation has not been validated in all clinical situations. eGFR's persistently <60 mL/min signify possible Chronic Kidney Disease.      ASSESSMENT and THERAPY PLAN:   Malignant neoplasm of upper-outer quadrant of left breast in female, estrogen receptor negative (HCC) Erika [name] is a 62 year old woman with stage IIB ER/PR negative, HER2 positive breast cancer here today for evaluation and follow-up prior to receiving cycle 5 of neoadjuvant Abraxane, carboplatin, Herceptin, and Perjeta.  Treatment plan: 1. Neoadjuvant chemotherapy with TCH Perjeta 6 cycles followed by Herceptin Perjeta maintenance versus Kadcyla maintenance (based on response to neoadjuvant chemo) for 1 year 2. Followed by breast conserving surgery if possible with sentinel lymph node study 3. Followed by adjuvant radiation therapy ------------------------------------------------------------------------ Erika [name] is a 62 year old woman receiving treatment with Abraxane, Carbo, Herceptin, Perjeta.  This is caused symptomatic anemia and she was here for blood transfusion today.  Allergic reaction: Her tongue  swelling is consistent with an allergic mediated reaction to the packed red blood cell transfusion.  We  will continue with our protocol of 125 mg of Solu-Medrol, we will give her the other 25 mg of Benadryl, she has received famotidine 20 mg.  She will continue to receive normal saline through her IV.  I spoke with Dr. Pamelia Hoit who recommended to monitor her for an hour and if her tongue swelling does not improve, he recommended administering the EpiPen.  If symptoms worsen he recommended administering the EpiPen.      All questions were answered. The patient knows to call the clinic with any problems, questions or concerns. We can certainly see the patient much sooner if necessary.  Total encounter time:30 minutes*in face-to-face visit time, chart review, lab review, care coordination, order entry, and documentation of the encounter time.    Lillard Anes, NP 09/09/22 1:27 PM Medical Oncology and Hematology [facility] [address] [place], [place] [address] Tel. [phone]    Fax. [phone]  *Total Encounter Time as defined by the Centers for Medicare and Medicaid Services includes, in addition to the face-to-face time of a patient visit (documented in the note above) non-face-to-face time: obtaining and reviewing outside history, ordering and reviewing medications, tests or procedures, care coordination (communications with other health care professionals or caregivers) and documentation in the medical record.

## 2022-09-09 NOTE — Patient Instructions (Signed)
Blood Transfusion, Adult A blood transfusion is a procedure in which you receive blood through an IV tube. You may need this procedure because of: A bleeding disorder. An illness. An injury. A surgery. The blood may come from someone else (a donor). You may also be able to donate blood for yourself before a surgery. The blood given in a transfusion may be made up of different types of cells. You may get: Red blood cells. These carry oxygen to the cells in the body. Platelets. These help your blood to clot. Plasma. This is the liquid part of your blood. It carries proteins and other substances through the body. White blood cells. These help you fight infections. If you have a clotting disorder, you may also get other types of blood products. Depending on the type of blood product, this procedure may take 1-4 hours to complete. Tell your doctor about: Any bleeding problems you have. Any reactions you have had during a blood transfusion in the past. Any allergies you have. All medicines you are taking, including vitamins, herbs, eye drops, creams, and over-the-counter medicines. Any surgeries you have had. Any medical conditions you have. Whether you are pregnant or may be pregnant. What are the risks? Talk with your health care provider about risks. The most common problems include: A mild allergic reaction. This includes red, swollen areas of skin (hives) and itching. Fever or chills. This may be the body's response to new blood cells received. This may happen during or up to 4 hours after the transfusion. More serious problems may include: A serious allergic reaction. This includes breathing trouble or swelling around the face and lips. Too much fluid in the lungs. This may cause breathing problems. Lung injury. This causes breathing trouble and low oxygen in the blood. This can happen within hours of the transfusion or days later. Too much iron. This can happen after getting many blood  transfusions over a period of time. An infection or virus passed through the blood. This is rare. Donated blood is carefully tested before it is given. Your body's defense system (immune system) trying to attack the new blood cells. This is rare. Symptoms may include fever, chills, nausea, low blood pressure, and low back or chest pain. Donated cells attacking healthy tissues. This is rare. What happens before the procedure? You will have a blood test to find out your blood type. The test also finds out what type of blood your body will accept and matches it to the donor type. If you are going to have a planned surgery, you may be able to donate your own blood. This may be done in case you need a transfusion. You will have your temperature, blood pressure, and pulse checked. You may receive medicine to help prevent an allergic reaction. This may be done if you have had a reaction to a transfusion before. This medicine may be given to you by mouth or through an IV tube. What happens during the procedure?  An IV tube will be put into one of your veins. The bag of blood will be attached to your IV tube. Then, the blood will enter through your vein. Your temperature, blood pressure, and pulse will be checked often. This is done to find early signs of a transfusion reaction. Tell your nurse right away if you have any of these symptoms: Shortness of breath or trouble breathing. Chest or back pain. Fever or chills. Red, swollen areas of skin or itching. If you have any signs   or symptoms of a reaction, your transfusion will be stopped. You may also be given medicine. When the transfusion is finished, your IV tube will be taken out. Pressure may be put on the IV site for a few minutes. A bandage (dressing) will be put on the IV site. The procedure may vary among doctors and hospitals. What happens after the procedure? You will be monitored until you leave the hospital or clinic. This includes  checking your temperature, blood pressure, pulse, breathing rate, and blood oxygen level. Your blood may be tested to see how you have responded to the transfusion. You may be warmed with fluids or blankets. This is done to keep the temperature of your body normal. If you have your procedure in an outpatient setting, you will be told whom to contact to report any reactions. Where to find more information Visit the [employer]: redcross.org Summary A blood transfusion is a procedure in which you receive blood through an IV tube. The blood you are given may be made up of different blood cells. You may receive red blood cells, platelets, plasma, or white blood cells. Your temperature, blood pressure, and pulse will be checked often. After the procedure, your blood may be tested to see how you have responded. This information is not intended to replace advice given to you by your health care provider. Make sure you discuss any questions you have with your health care provider. Document Revised: 06/21/2021 Document Reviewed: 06/21/2021 Elsevier Patient Education  2024 Elsevier Inc.  

## 2022-09-09 NOTE — Progress Notes (Signed)
Pt Hgb 7.7 and reports severe fatigue.  Verbal orders received from MD for pt to receive 2 units PRBC's.  Orders placed.

## 2022-09-09 NOTE — Progress Notes (Signed)
1520: Pt reports feeling better. Tongue still slightly swollen, but continues to feel better per pt report. VSS. Lillard Anes notified and ok to discharge pt home. Pt educated on returning to the ED if she starts to feel unwell. Pt verbalizes understanding.

## 2022-09-09 NOTE — Assessment & Plan Note (Signed)
Erika [name] is a 62 year old woman with stage IIB ER/PR negative, HER2 positive breast cancer here today for evaluation and follow-up prior to receiving cycle 5 of neoadjuvant Abraxane, carboplatin, Herceptin, and Perjeta.  Treatment plan: 1. Neoadjuvant chemotherapy with TCH Perjeta 6 cycles followed by Herceptin Perjeta maintenance versus Kadcyla maintenance (based on response to neoadjuvant chemo) for 1 year 2. Followed by breast conserving surgery if possible with sentinel lymph node study 3. Followed by adjuvant radiation therapy ------------------------------------------------------------------------ Hang is a 62 year old woman receiving treatment with Abraxane, Carbo, Herceptin, Perjeta.  This is caused symptomatic anemia and she was here for blood transfusion today.  Allergic reaction: Her tongue swelling is consistent with an allergic mediated reaction to the packed red blood cell transfusion.  We will continue with our protocol of 125 mg of Solu-Medrol, we will give her the other 25 mg of Benadryl, she has received famotidine 20 mg.  She will continue to receive normal saline through her IV.  I spoke with Dr. Pamelia Hoit who recommended to monitor her for an hour and if her tongue swelling does not improve, he recommended administering the EpiPen.  If symptoms worsen he recommended administering the EpiPen.

## 2022-09-10 ENCOUNTER — Other Ambulatory Visit: Payer: Self-pay | Admitting: Hematology and Oncology

## 2022-09-10 ENCOUNTER — Telehealth: Payer: Self-pay

## 2022-09-10 LAB — TYPE AND SCREEN
ABO/RH(D): O POS
Antibody Screen: NEGATIVE
Unit division: 0

## 2022-09-10 LAB — BPAM RBC
Blood Product Expiration Date: 202407082359
ISSUE DATE / TIME: 202406041027
Unit Type and Rh: 5100

## 2022-09-10 NOTE — Telephone Encounter (Signed)
-----   Message from Loa Socks, NP sent at 09/10/2022 10:59 AM EDT ----- Will you call and check on Erika [name] today?  She had a transfusion reaction yesterday with tongue swelling.  I would like to know how she is feeling and also she is having any urinary symptoms such as painful urination or urinary frequency. ----- Message ----- From: Interface, Lab In [facility] Sent: 09/09/2022   2:40 PM EDT To: Loa Socks, NP

## 2022-09-10 NOTE — Telephone Encounter (Signed)
Called and asked below questions. Pt states she is feeling "the best I have in a while" and was very appreciative of how the team handled the transfusion reaction. Pt reports painful urination off and on over the weekend but currently is not having issues. Pt states she would like to hold off on antibiotics for now but that if symptoms worsen she will give [place] a call. Message routed to NP.

## 2022-09-10 NOTE — Telephone Encounter (Signed)
Thanks Eliza--can we add urinalysis and urine culture to next week's labs?

## 2022-09-11 ENCOUNTER — Encounter: Payer: Self-pay | Admitting: *Deleted

## 2022-09-11 ENCOUNTER — Other Ambulatory Visit: Payer: Self-pay | Admitting: *Deleted

## 2022-09-11 DIAGNOSIS — Z171 Estrogen receptor negative status [ER-]: Secondary | ICD-10-CM

## 2022-09-18 ENCOUNTER — Inpatient Hospital Stay (HOSPITAL_BASED_OUTPATIENT_CLINIC_OR_DEPARTMENT_OTHER): Payer: 59 | Admitting: Adult Health

## 2022-09-18 ENCOUNTER — Inpatient Hospital Stay: Payer: 59

## 2022-09-18 ENCOUNTER — Other Ambulatory Visit: Payer: Self-pay

## 2022-09-18 ENCOUNTER — Encounter: Payer: Self-pay | Admitting: Adult Health

## 2022-09-18 ENCOUNTER — Telehealth: Payer: Self-pay | Admitting: *Deleted

## 2022-09-18 VITALS — BP 134/80 | HR 74 | Resp 18

## 2022-09-18 VITALS — BP 118/80 | HR 97 | Temp 97.8°F | Resp 18 | Ht 68.0 in | Wt 164.9 lb

## 2022-09-18 DIAGNOSIS — Z5112 Encounter for antineoplastic immunotherapy: Secondary | ICD-10-CM | POA: Diagnosis not present

## 2022-09-18 DIAGNOSIS — Z171 Estrogen receptor negative status [ER-]: Secondary | ICD-10-CM

## 2022-09-18 DIAGNOSIS — C50412 Malignant neoplasm of upper-outer quadrant of left female breast: Secondary | ICD-10-CM | POA: Diagnosis not present

## 2022-09-18 LAB — URINALYSIS, COMPLETE (UACMP) WITH MICROSCOPIC
Bacteria, UA: NONE SEEN
Bilirubin Urine: NEGATIVE
Glucose, UA: NEGATIVE mg/dL
Hgb urine dipstick: NEGATIVE
Ketones, ur: NEGATIVE mg/dL
Leukocytes,Ua: NEGATIVE
Nitrite: NEGATIVE
Protein, ur: NEGATIVE mg/dL
Specific Gravity, Urine: 1.008 (ref 1.005–1.030)
pH: 5 (ref 5.0–8.0)

## 2022-09-18 LAB — CBC WITH DIFFERENTIAL (CANCER CENTER ONLY)
Abs Immature Granulocytes: 0.01 10*3/uL (ref 0.00–0.07)
Basophils Absolute: 0 10*3/uL (ref 0.0–0.1)
Basophils Relative: 0 %
Eosinophils Absolute: 0.1 10*3/uL (ref 0.0–0.5)
Eosinophils Relative: 2 %
HCT: 29 % — ABNORMAL LOW (ref 36.0–46.0)
Hemoglobin: 9.7 g/dL — ABNORMAL LOW (ref 12.0–15.0)
Immature Granulocytes: 0 %
Lymphocytes Relative: 42 %
Lymphs Abs: 2.2 10*3/uL (ref 0.7–4.0)
MCH: 34.4 pg — ABNORMAL HIGH (ref 26.0–34.0)
MCHC: 33.4 g/dL (ref 30.0–36.0)
MCV: 102.8 fL — ABNORMAL HIGH (ref 80.0–100.0)
Monocytes Absolute: 0.7 10*3/uL (ref 0.1–1.0)
Monocytes Relative: 13 %
Neutro Abs: 2.2 10*3/uL (ref 1.7–7.7)
Neutrophils Relative %: 43 %
Platelet Count: 136 10*3/uL — ABNORMAL LOW (ref 150–400)
RBC: 2.82 MIL/uL — ABNORMAL LOW (ref 3.87–5.11)
RDW: 19.6 % — ABNORMAL HIGH (ref 11.5–15.5)
WBC Count: 5.2 10*3/uL (ref 4.0–10.5)
nRBC: 0 % (ref 0.0–0.2)

## 2022-09-18 LAB — CMP (CANCER CENTER ONLY)
ALT: 23 U/L (ref 0–44)
AST: 17 U/L (ref 15–41)
Albumin: 4 g/dL (ref 3.5–5.0)
Alkaline Phosphatase: 98 U/L (ref 38–126)
Anion gap: 9 (ref 5–15)
BUN: 17 mg/dL (ref 6–20)
CO2: 26 mmol/L (ref 22–32)
Calcium: 9.5 mg/dL (ref 8.9–10.3)
Chloride: 107 mmol/L (ref 98–111)
Creatinine: 0.79 mg/dL (ref 0.44–1.00)
GFR, Estimated: >60 mL/min (ref >60)
Glucose, Bld: 117 mg/dL — ABNORMAL HIGH (ref 70–99)
Potassium: 3.5 mmol/L (ref 3.5–5.1)
Sodium: 142 mmol/L (ref 135–145)
Total Bilirubin: 0.5 mg/dL (ref 0.3–1.2)
Total Protein: 6.9 g/dL (ref 6.5–8.1)

## 2022-09-18 LAB — SAMPLE TO BLOOD BANK

## 2022-09-18 MED ORDER — SODIUM CHLORIDE 0.9 % IV SOLN
420.0000 mg | Freq: Once | INTRAVENOUS | Status: AC
  Administered 2022-09-18: 420 mg via INTRAVENOUS
  Filled 2022-09-18: qty 14

## 2022-09-18 MED ORDER — SODIUM CHLORIDE 0.9 % IV SOLN: Freq: Once | INTRAVENOUS | Status: AC

## 2022-09-18 MED ORDER — ACETAMINOPHEN 325 MG PO TABS
650.0000 mg | ORAL_TABLET | Freq: Once | ORAL | Status: AC
  Administered 2022-09-18: 650 mg via ORAL
  Filled 2022-09-18: qty 2

## 2022-09-18 MED ORDER — FAMOTIDINE IN NACL 20-0.9 MG/50ML-% IV SOLN
20.0000 mg | Freq: Once | INTRAVENOUS | Status: AC
  Administered 2022-09-18: 20 mg via INTRAVENOUS
  Filled 2022-09-18: qty 50

## 2022-09-18 MED ORDER — SODIUM CHLORIDE 0.9 % IV SOLN
150.0000 mg | Freq: Once | INTRAVENOUS | Status: AC
  Administered 2022-09-18: 150 mg via INTRAVENOUS
  Filled 2022-09-18: qty 150

## 2022-09-18 MED ORDER — PALONOSETRON HCL INJECTION 0.25 MG/5ML
0.2500 mg | Freq: Once | INTRAVENOUS | Status: AC
  Administered 2022-09-18: 0.25 mg via INTRAVENOUS
  Filled 2022-09-18: qty 5

## 2022-09-18 MED ORDER — PACLITAXEL PROTEIN-BOUND CHEMO INJECTION 100 MG
260.0000 mg/m2 | Freq: Once | INTRAVENOUS | Status: AC
  Administered 2022-09-18: 500 mg via INTRAVENOUS
  Filled 2022-09-18: qty 100

## 2022-09-18 MED ORDER — SODIUM CHLORIDE 0.9% FLUSH: 10.0000 mL | INTRAVENOUS | Status: DC | PRN

## 2022-09-18 MED ORDER — TRASTUZUMAB-ANNS CHEMO 150 MG IV SOLR
5.0000 mg/kg | Freq: Once | INTRAVENOUS | Status: AC
  Administered 2022-09-18: 420 mg via INTRAVENOUS
  Filled 2022-09-18: qty 20

## 2022-09-18 MED ORDER — HEPARIN SOD (PORK) LOCK FLUSH 100 UNIT/ML IV SOLN: 500.0000 [IU] | Freq: Once | INTRAVENOUS | Status: DC | PRN

## 2022-09-18 MED ORDER — SODIUM CHLORIDE 0.9 % IV SOLN
739.2000 mg | Freq: Once | INTRAVENOUS | Status: AC
  Administered 2022-09-18: 740 mg via INTRAVENOUS
  Filled 2022-09-18: qty 74

## 2022-09-18 MED ORDER — LORAZEPAM 2 MG/ML IJ SOLN
0.5000 mg | Freq: Once | INTRAMUSCULAR | Status: AC
  Administered 2022-09-18: 0.5 mg via INTRAVENOUS
  Filled 2022-09-18: qty 1

## 2022-09-18 MED ORDER — SODIUM CHLORIDE 0.9 % IV SOLN
10.0000 mg | Freq: Once | INTRAVENOUS | Status: AC
  Administered 2022-09-18: 10 mg via INTRAVENOUS
  Filled 2022-09-18: qty 10

## 2022-09-18 MED ORDER — SODIUM CHLORIDE 0.9 % IV SOLN
10.0000 mg | Freq: Once | INTRAVENOUS | Status: DC
  Filled 2022-09-18: qty 1

## 2022-09-18 NOTE — Patient Instructions (Signed)
[facility]  Discharge Instructions: Thank you for choosing [facility] to provide your oncology and hematology care.   If you have a lab appointment with the Cancer Center, please go directly to the Cancer Center and check in at the registration area.   Wear comfortable clothing and clothing appropriate for easy access to any Portacath or PICC line.   We strive to give you quality time with your provider. You may need to reschedule your appointment if you arrive late (15 or more minutes).  Arriving late affects you and other patients whose appointments are after yours.  Also, if you miss three or more appointments without notifying the office, you may be dismissed from the clinic at the provider's discretion.      For prescription refill requests, have your pharmacy contact our office and allow 72 hours for refills to be completed.    Today you received the following chemotherapy and/or immunotherapy agents: Kanjinti, Perjeta, Paclitaxel, Carboplatin.       To help prevent nausea and vomiting after your treatment, we encourage you to take your nausea medication as directed.  BELOW ARE SYMPTOMS THAT SHOULD BE REPORTED IMMEDIATELY: *FEVER GREATER THAN 100.4 F (38 C) OR HIGHER *CHILLS OR SWEATING *NAUSEA AND VOMITING THAT IS NOT CONTROLLED WITH YOUR NAUSEA MEDICATION *UNUSUAL SHORTNESS OF BREATH *UNUSUAL BRUISING OR BLEEDING *URINARY PROBLEMS (pain or burning when urinating, or frequent urination) *BOWEL PROBLEMS (unusual diarrhea, constipation, pain near the anus) TENDERNESS IN MOUTH AND THROAT WITH OR WITHOUT PRESENCE OF ULCERS (sore throat, sores in mouth, or a toothache) UNUSUAL RASH, SWELLING OR PAIN  UNUSUAL VAGINAL DISCHARGE OR ITCHING   Items with * indicate a potential emergency and should be followed up as soon as possible or go to the Emergency Department if any problems should occur.  Please show the CHEMOTHERAPY ALERT CARD  or IMMUNOTHERAPY ALERT CARD at check-in to the Emergency Department and triage nurse.  Should you have questions after your visit or need to cancel or reschedule your appointment, please contact [facility]  Dept: [phone]  and follow the prompts.  Office hours are 8:00 a.m. to 4:30 p.m. Monday - Friday. Please note that voicemails left after 4:00 p.m. may not be returned until the following business day.  We are closed weekends and major holidays. You have access to a nurse at all times for urgent questions. Please call the main number to the clinic Dept: [phone] and follow the prompts.   For any non-urgent questions, you may also contact your provider using MyChart. We now offer e-Visits for anyone 25 and older to request care online for non-urgent symptoms. For details visit [email or web address].   Also download the MyChart app! Go to the app store, search "MyChart", open the app, select , and log in with your MyChart username and password.

## 2022-09-18 NOTE — Progress Notes (Signed)
[facility] Cancer Follow up:    Erika Croissant, MD [address] [place] [address]   DIAGNOSIS:  Cancer Staging  Malignant neoplasm of upper-outer quadrant of left breast in female, estrogen receptor negative (HCC) Staging form: Breast, AJCC 8th Edition - Clinical stage from 05/21/2022: Stage IIB (cT2, cN1, cM0, G3, ER-, PR-, HER2+) - Signed by Erika Croissant, MD on 05/21/2022 Stage prefix: Initial diagnosis Histologic grading system: 3 grade system   SUMMARY OF ONCOLOGIC HISTORY: Oncology History  Malignant neoplasm of upper-outer quadrant of left breast in female, estrogen receptor negative (HCC)  05/15/2022 Initial Diagnosis   Palpable mass in the left breast at 1 o'clock position measured 4.2 cm ultrasound-guided biopsy revealed grade 3 IDC ER/PR negative HER2 positive Ki-67 35%, left axillary lymph node: Biopsy positive   05/21/2022 Cancer Staging   Staging form: Breast, AJCC 8th Edition - Clinical stage from 05/21/2022: Stage IIB (cT2, cN1, cM0, G3, ER-, PR-, HER2+) - Signed by Erika Croissant, MD on 05/21/2022 Stage prefix: Initial diagnosis Histologic grading system: 3 grade system   05/29/2022 Genetic Testing   Negative [facility] Multi-Cancer +RNA Panel.  Report date is 05/29/2022.   The Multi-Cancer + RNA Panel offered by [facility] includes sequencing and/or deletion/duplication analysis of the following 70 genes:  AIP*, ALK, APC*, ATM*, AXIN2*, BAP1*, BARD1*, BLM*, BMPR1A*, BRCA1*, BRCA2*, BRIP1*, CDC73*, CDH1*, CDK4, CDKN1B*, CDKN2A, CHEK2*, CTNNA1*, DICER1*, EPCAM (del/dup only), EGFR, FH*, FLCN*, GREM1 (promoter dup only), HOXB13, KIT, LZTR1, MAX*, MBD4, MEN1*, MET, MITF, MLH1*, MSH2*, MSH3*, MSH6*, MUTYH*, NF1*, NF2*, NTHL1*, PALB2*, PDGFRA, PMS2*, POLD1*, POLE*, POT1*, PRKAR1A*, PTCH1*, PTEN*, RAD51C*, RAD51D*, RB1*, RET, SDHA* (sequencing only), SDHAF2*, SDHB*, SDHC*, SDHD*, SMAD4*, SMARCA4*, SMARCB1*, SMARCE1*, STK11*, SUFU*, TMEM127*, TP53*, TSC1*,  TSC2*, VHL*. RNA analysis is performed for * genes.   06/05/2022 -  Chemotherapy   Patient is on Treatment Plan : BREAST  Docetaxel + Carboplatin + Trastuzumab + Pertuzumab  (TCHP) q21d        CURRENT THERAPY:  TCHP  INTERVAL HISTORY: Erika [name] 62 y.o. female returns for follow-up and evaluation prior to receiving her sixth cycle of Taxotere, carboplatin, Herceptin, Perjeta.  Please note that Abraxane has been substituted for the Taxotere due to her previous reaction.  She received a unit of blood last week.  She was supposed to receive 2 units however at the beginning of the second unit her tongue began to swell and we stopped the transfusion.  Her hemoglobin today is 9.7.  She is feeling much better than she did previously.  She denies any peripheral neuropathy, shortness of breath chest pain or funny heartbeats.  Her most recent echocardiogram occurred on Aug 25, 2022 demonstrating a left ventricular ejection fraction of 55 to 60%.   Patient Active Problem List   Diagnosis Date Noted   Port-A-Cath in place 06/03/2022   Genetic testing 05/30/2022   Therapeutic drug monitoring 05/27/2022   Malignant neoplasm of upper-outer quadrant of left breast in female, estrogen receptor negative (HCC) 05/19/2022    is allergic to penicillins.  MEDICAL HISTORY: Past Medical History:  Diagnosis Date   Anxiety    Colitis 04/07/2008   seen on CT (at ER visit)   Depression    GERD (gastroesophageal reflux disease)    Hyperplastic colon polyp    Hypertension    Liver hemangioma 04/07/2008   seen on CT    SURGICAL HISTORY: Past Surgical History:  Procedure Laterality Date   "freezing of cervix"     ABDOMINAL  HYSTERECTOMY     BREAST BIOPSY Left 05/15/2022   [place] LT BREAST BX W LOC DEV 1ST LESION IMG BX SPEC US GUIDE 05/15/2022 [facility] MAMMOGRAPHY   CESAREAN SECTION     NASAL SINUS SURGERY     PORTACATH PLACEMENT N/A 05/29/2022   Procedure: INSERTION PORT-A-CATH;  Surgeon: [name], MD;  Location: [facility];  Service: General;  Laterality: N/A;   right kneecap surgery +      SOCIAL HISTORY: Social History   Socioeconomic History   Marital status: Married    Spouse name: Not on file   Number of children: 0   Years of education: Not on file   Highest education level: Not on file  Occupational History   Occupation: Real[employer]  Tobacco Use   Smoking status: Never   Smokeless tobacco: Never   Tobacco comments:    smoked 1 year while in college  Vaping Use   Vaping Use: Never used  Substance and Sexual Activity   Alcohol use: Yes    Alcohol/week: 14.0 standard drinks of alcohol    Types: 14 Glasses of wine per week    Comment: wine - daily   Drug use: No   Sexual activity: Not on file  Other Topics Concern   Not on file  Social History Narrative   1 caffeine drinks daily    Social Determinants of Health   Financial Resource Strain: Not on file  Food Insecurity: Not on file  Transportation Needs: Not on file  Physical Activity: Not on file  Stress: Not on file  Social Connections: Not on file  Intimate Partner Violence: Not on file    FAMILY HISTORY: Family History  Problem Relation Age of Onset   Kidney cancer Mother        dx after 60; early stage   Hypertension Father    Heart disease Father    Colon polyps Father    Lung cancer Father 77       smoking hx   Colon polyps Sister    Hypertension Brother    Cancer Maternal Uncle        ? blood cancer; dx after 50   Cancer Maternal Grandmother        ? gallbladder ? colon; d. after 60   Diabetes Paternal Grandmother     Review of Systems  Constitutional:  Positive for fatigue. Negative for appetite change, chills, fever and unexpected weight change.  HENT:   Negative for hearing loss, lump/mass and trouble swallowing.   Eyes:  Negative for eye problems and icterus.  Respiratory:  Negative for chest tightness, cough and shortness of breath.   Cardiovascular:  Negative for chest  pain, leg swelling and palpitations.  Gastrointestinal:  Negative for abdominal distention, abdominal pain, constipation, diarrhea, nausea and vomiting.  Endocrine: Negative for hot flashes.  Genitourinary:  Negative for difficulty urinating.   Musculoskeletal:  Negative for arthralgias.  Skin:  Negative for itching and rash.  Neurological:  Negative for dizziness, extremity weakness, headaches and numbness.  Hematological:  Negative for adenopathy. Does not bruise/bleed easily.  Psychiatric/Behavioral:  Negative for depression. The patient is not nervous/anxious.       PHYSICAL EXAMINATION   Onc Performance Status - 09/18/22 0825       ECOG Perf Status   ECOG Perf Status Fully active, able to carry on all pre-disease performance without restriction      KPS SCALE   KPS % SCORE Able to carry on normal activity, minor  s/s of disease             Vitals:   09/18/22 0820  BP: 118/80  Pulse: 97  Resp: 18  Temp: 97.8 F (36.6 C)  SpO2: 98%    Physical Exam Constitutional:      General: She is not in acute distress.    Appearance: Normal appearance. She is not toxic-appearing.  HENT:     Head: Normocephalic and atraumatic.     Mouth/Throat:     Mouth: Mucous membranes are moist.     Pharynx: Oropharynx is clear. No oropharyngeal exudate or posterior oropharyngeal erythema.  Eyes:     General: No scleral icterus. Cardiovascular:     Rate and Rhythm: Normal rate and regular rhythm.     Pulses: Normal pulses.     Heart sounds: Normal heart sounds.  Pulmonary:     Effort: Pulmonary effort is normal.     Breath sounds: Normal breath sounds.  Abdominal:     General: Abdomen is flat. Bowel sounds are normal. There is no distension.     Palpations: Abdomen is soft.     Tenderness: There is no abdominal tenderness.  Musculoskeletal:        General: No swelling.     Cervical back: Neck supple.  Lymphadenopathy:     Cervical: No cervical adenopathy.  Skin:    General:  Skin is warm and dry.     Findings: No rash.  Neurological:     General: No focal deficit present.     Mental Status: She is alert.  Psychiatric:        Mood and Affect: Mood normal.        Behavior: Behavior normal.     LABORATORY DATA:  CBC    Component Value Date/Time   WBC 5.2 09/18/2022 0802   WBC 13.5 (H) 01/21/2009 0826   RBC 2.82 (L) 09/18/2022 0802   HGB 9.7 (L) 09/18/2022 0802   HCT 29.0 (L) 09/18/2022 0802   PLT 136 (L) 09/18/2022 0802   MCV 102.8 (H) 09/18/2022 0802   MCH 34.4 (H) 09/18/2022 0802   MCHC 33.4 09/18/2022 0802   RDW 19.6 (H) 09/18/2022 0802   LYMPHSABS 2.2 09/18/2022 0802   MONOABS 0.7 09/18/2022 0802   EOSABS 0.1 09/18/2022 0802   BASOSABS 0.0 09/18/2022 0802    CMP     Component Value Date/Time   NA 142 09/18/2022 0802   K 3.5 09/18/2022 0802   CL 107 09/18/2022 0802   CO2 26 09/18/2022 0802   GLUCOSE 117 (H) 09/18/2022 0802   BUN 17 09/18/2022 0802   CREATININE 0.79 09/18/2022 0802   CALCIUM 9.5 09/18/2022 0802   PROT 6.9 09/18/2022 0802   ALBUMIN 4.0 09/18/2022 0802   AST 17 09/18/2022 0802   ALT 23 09/18/2022 0802   ALKPHOS 98 09/18/2022 0802   BILITOT 0.5 09/18/2022 0802   GFRNONAA >60 09/18/2022 0802   GFRAA  01/21/2009 0826    >60        The eGFR has been calculated using the MDRD equation. This calculation has not been validated in all clinical situations. eGFR's persistently <60 mL/min signify possible Chronic Kidney Disease.         ASSESSMENT and THERAPY PLAN:   Malignant neoplasm of upper-outer quadrant of left breast in female, estrogen receptor negative (HCC) Erika [name] is a 62 year old woman with stage IIB ER/PR negative, HER2 positive breast cancer here today for evaluation and follow-up prior to receiving cycle 6 of  neoadjuvant Abraxane, carboplatin, Herceptin, and Perjeta.  Treatment plan: 1. Neoadjuvant chemotherapy with TCH Perjeta 6 cycles followed by Herceptin Perjeta maintenance versus Kadcyla  maintenance (based on response to neoadjuvant chemo) for 1 year 2. Followed by breast conserving surgery if possible with sentinel lymph node study 3. Followed by adjuvant radiation therapy ------------------------------------------------------------------------ Erika [name] is a 62 year old woman receiving treatment with Abraxane, Carbo, Herceptin, Perjeta.    Stage IIb HER2 positive breast cancer: Will proceed with her final cycle of TCHP today.  Her labs are stable and I reviewed them with her in detail.  She will undergo repeat breast MRI tomorrow and has follow-up with Dr. Lucius Conn on June 24 to discuss surgery. Symptomatic anemia: This is from her chemotherapy.  Her hemoglobin is 9.7 today. She denies peripheral neuropathy. Fatigue: This is much improved since receiving the blood transfusion.  She still is managing this with energy conservation.  Erika [name] will return in 3 weeks for labs, follow-up, and Herceptin Perjeta.  She knows to call for any questions or concerns that may arise between now and her next visit with [place].   All questions were answered. The patient knows to call the clinic with any problems, questions or concerns. We can certainly see the patient much sooner if necessary.  Total encounter time:30 minutes*in face-to-face visit time, chart review, lab review, care coordination, order entry, and documentation of the encounter time.  Lillard Anes, NP 09/18/22 9:34 AM Medical Oncology and Hematology [facility] [address] [place], [place] [address] Tel. [phone]    Fax. [phone]  *Total Encounter Time as defined by the Centers for Medicare and Medicaid Services includes, in addition to the face-to-face time of a patient visit (documented in the note above) non-face-to-face time: obtaining and reviewing outside history, ordering and reviewing medications, tests or procedures, care coordination (communications with other health care professionals or  caregivers) and documentation in the medical record.

## 2022-09-18 NOTE — Progress Notes (Signed)
Pt has lost weight since treatment plan was entered. Per Mardella Layman, NP, it is ok to reduce the Kanjinti dose to today's calculated dose (448mg , rounded to nearest vial size of 420mg )  This is 5mg /kg dosing from weight entered in treatment plan (83.2kg), but is 6mg /kg when current weight is used to calculate dose.   Renaee Munda, PharmD PGY-2 Pharmacy Resident Hematology/Oncology [phone]  09/18/2022 10:30 AM

## 2022-09-18 NOTE — Telephone Encounter (Addendum)
-----   Message from Loa Socks, NP sent at 09/18/2022  4:53 PM EDT ----- Urine is clear and improved from prior.  Please let the patient know.   Contacted patient with information in message above. Patient verbalized understanding.

## 2022-09-18 NOTE — Assessment & Plan Note (Signed)
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  Treatment plan: 1. Neoadjuvant chemotherapy with TCH Perjeta 6 cycles followed by Herceptin Perjeta maintenance versus Kadcyla maintenance (based on response to neoadjuvant chemo) for 1 year 2. Followed by breast conserving surgery if possible with sentinel lymph node study 3. Followed by adjuvant radiation therapy ------------------------------------------------------------------------ Erika [name] is a 62 year old woman receiving treatment with Abraxane, Carbo, Herceptin, Perjeta.    Stage IIb HER2 positive breast cancer: Will proceed with her final cycle of TCHP today.  Her labs are stable and I reviewed them with her in detail.  She will undergo repeat breast MRI tomorrow and has follow-up with Dr. Lucius Conn on June 24 to discuss surgery. Symptomatic anemia: This is from her chemotherapy.  Her hemoglobin is 9.7 today. She denies peripheral neuropathy. Fatigue: This is much improved since receiving the blood transfusion.  She still is managing this with energy conservation.  Erika [name] will return in 3 weeks for labs, follow-up, and Herceptin Perjeta.  She knows to call for any questions or concerns that may arise between now and her next visit with [place].

## 2022-09-19 ENCOUNTER — Ambulatory Visit
Admission: RE | Admit: 2022-09-19 | Discharge: 2022-09-19 | Disposition: A | Discharge status: Home / Self Care | Payer: 59 | Source: Ambulatory Visit | Attending: Hematology and Oncology | Admitting: Hematology and Oncology

## 2022-09-19 ENCOUNTER — Other Ambulatory Visit: Payer: Self-pay

## 2022-09-19 DIAGNOSIS — Z171 Estrogen receptor negative status [ER-]: Secondary | ICD-10-CM

## 2022-09-19 LAB — URINE CULTURE: Culture: <10000 — AB

## 2022-09-19 MED ORDER — GADOPICLENOL 0.5 MMOL/ML IV SOLN
7.0000 mL | Freq: Once | INTRAVENOUS | Status: AC | PRN
  Administered 2022-09-19: 7 mL via INTRAVENOUS

## 2022-09-20 ENCOUNTER — Other Ambulatory Visit: Payer: Self-pay

## 2022-09-20 ENCOUNTER — Inpatient Hospital Stay: Payer: 59

## 2022-09-20 ENCOUNTER — Encounter: Payer: Self-pay | Admitting: Adult Health

## 2022-09-20 VITALS — BP 138/79 | HR 73 | Temp 97.9°F | Resp 16 | Ht 68.0 in

## 2022-09-20 DIAGNOSIS — Z5112 Encounter for antineoplastic immunotherapy: Secondary | ICD-10-CM | POA: Diagnosis not present

## 2022-09-20 DIAGNOSIS — Z171 Estrogen receptor negative status [ER-]: Secondary | ICD-10-CM

## 2022-09-20 MED ORDER — PEGFILGRASTIM-CBQV 6 MG/0.6ML ~~LOC~~ SOSY
6.0000 mg | PREFILLED_SYRINGE | Freq: Once | SUBCUTANEOUS | Status: AC
  Administered 2022-09-20: 6 mg via SUBCUTANEOUS

## 2022-09-22 ENCOUNTER — Inpatient Hospital Stay (HOSPITAL_BASED_OUTPATIENT_CLINIC_OR_DEPARTMENT_OTHER): Payer: 59 | Admitting: Physician Assistant

## 2022-09-22 ENCOUNTER — Other Ambulatory Visit (HOSPITAL_COMMUNITY): Payer: Self-pay

## 2022-09-22 ENCOUNTER — Other Ambulatory Visit: Payer: Self-pay

## 2022-09-22 ENCOUNTER — Encounter: Payer: Self-pay | Admitting: Hematology and Oncology

## 2022-09-22 ENCOUNTER — Telehealth: Payer: Self-pay | Admitting: *Deleted

## 2022-09-22 ENCOUNTER — Telehealth: Payer: Self-pay | Admitting: Adult Health

## 2022-09-22 VITALS — BP 112/69 | HR 79 | Temp 98.2°F | Resp 16 | Wt 164.9 lb

## 2022-09-22 DIAGNOSIS — C50412 Malignant neoplasm of upper-outer quadrant of left female breast: Secondary | ICD-10-CM

## 2022-09-22 DIAGNOSIS — Z171 Estrogen receptor negative status [ER-]: Secondary | ICD-10-CM

## 2022-09-22 DIAGNOSIS — K1230 Oral mucositis (ulcerative), unspecified: Secondary | ICD-10-CM

## 2022-09-22 DIAGNOSIS — Z5112 Encounter for antineoplastic immunotherapy: Secondary | ICD-10-CM | POA: Diagnosis not present

## 2022-09-22 MED ORDER — LIDOCAINE VISCOUS HCL 2 % MT SOLN
5.0000 mL | Freq: Four times a day (QID) | OROMUCOSAL | 0 refills | Status: DC | PRN
  Filled 2022-09-22: qty 280, 14d supply, fill #0

## 2022-09-22 MED ORDER — SODIUM CHLORIDE 0.9 % IV SOLN: Freq: Once | INTRAVENOUS | Status: AC

## 2022-09-22 NOTE — Telephone Encounter (Signed)
Received call from pt with complaint of raw blisters and black spots on lower lip.  Pt states blisters are very painful and requesting advice from MD.  Per MD pt needing to be seen in [facility].  Appt scheduled, pt notified and verbalized understanding.

## 2022-09-22 NOTE — Progress Notes (Signed)
Symptom Management Consult Note [facility]    Patient Care Team: [name], MD as PCP - General (Hematology and Oncology) [name], MD as Consulting Physician (General Surgery) [name], MD as Consulting Physician (Hematology and Oncology) Antony Blackbird, MD as Consulting Physician (Radiation Oncology) Donnelly Angelica, RN as Oncology Nurse Navigator Pershing Proud, RN as Oncology Nurse Navigator    Name / MRN / DOB: Erika [name]  [id number]  12/02/1960   Date of visit: 09/22/2022   Chief Complaint/Reason for visit: mouth sores   Current Therapy: TCHP  Last treatment:  Day 1   Cycle 6 on 09/18/22   ASSESSMENT & PLAN: Patient is a 62 y.o. female  with oncologic history of malignant neoplasm of upper-outer quadrant of left breast in female, estrogen receptor negative followed by Dr. Pamelia Hoit.  I have viewed most recent oncology note and lab work.    #Malignant neoplasm of upper-outer quadrant of left breast in female, estrogen receptor negative  - Next appointment with oncologist is 10/07/22   #Mouth sores -Exam consistent with mucositis on lips grade 3, no lesions on buccal mucosa and no sore throat.  Prescription sent to pharmacy for Magic mouthwash, encourage patient to make a paste out of it with Aquaphor to help it adhered to her lips. -Patient has had decreased PO intake secondary to pain.  BP on arrival is soft 95/71. -Patient saved a liter of IV fluids and when rechecked BP improved 112/69.  Will return for IV fluids later this week for hydration support. Oncologist agrees with plan of care.   Strict ED precautions discussed should symptoms worsen.'    Heme/Onc History: Oncology History  Malignant neoplasm of upper-outer quadrant of left breast in female, estrogen receptor negative (HCC)  05/15/2022 Initial Diagnosis   Palpable mass in the left breast at 1 o'clock position measured 4.2 cm ultrasound-guided biopsy revealed grade 3  IDC ER/PR negative HER2 positive Ki-67 35%, left axillary lymph node: Biopsy positive   05/21/2022 Cancer Staging   Staging form: Breast, AJCC 8th Edition - Clinical stage from 05/21/2022: Stage IIB (cT2, cN1, cM0, G3, ER-, PR-, HER2+) - Signed by [name], MD on 05/21/2022 Stage prefix: Initial diagnosis Histologic grading system: 3 grade system   05/29/2022 Genetic Testing   Negative [facility] Multi-Cancer +RNA Panel.  Report date is 05/29/2022.   The Multi-Cancer + RNA Panel offered by [facility] includes sequencing and/or deletion/duplication analysis of the following 70 genes:  AIP*, ALK, APC*, ATM*, AXIN2*, BAP1*, BARD1*, BLM*, BMPR1A*, BRCA1*, BRCA2*, BRIP1*, CDC73*, CDH1*, CDK4, CDKN1B*, CDKN2A, CHEK2*, CTNNA1*, DICER1*, EPCAM (del/dup only), EGFR, FH*, FLCN*, GREM1 (promoter dup only), HOXB13, KIT, LZTR1, MAX*, MBD4, MEN1*, MET, MITF, MLH1*, MSH2*, MSH3*, MSH6*, MUTYH*, NF1*, NF2*, NTHL1*, PALB2*, PDGFRA, PMS2*, POLD1*, POLE*, POT1*, PRKAR1A*, PTCH1*, PTEN*, RAD51C*, RAD51D*, RB1*, RET, SDHA* (sequencing only), SDHAF2*, SDHB*, SDHC*, SDHD*, SMAD4*, SMARCA4*, SMARCB1*, SMARCE1*, STK11*, SUFU*, TMEM127*, TP53*, TSC1*, TSC2*, VHL*. RNA analysis is performed for * genes.   06/05/2022 -  Chemotherapy   Patient is on Treatment Plan : BREAST  Docetaxel + Carboplatin + Trastuzumab + Pertuzumab  (TCHP) q21d          Interval history-: EULAR CHRISTOS is a 62 y.o. female with oncologic history as above presenting to [facility] today with chief complaint of mouth sores.  She is accompanied by significant other who provides additional history.  Patient states there is on her lips started x 4 days ago.  She tried applying olive  oil without any symptom improvement.  She has soreness localized to the blisters.  She is having difficulty eating and drinking as it is painful when she opens her mouth.  She has not had any fevers.  She denies any oral trauma.  She has no pain in and denies sore throat.  Denies history  of similar symptoms.  She has no difficulty swallowing or breathing.  No over-the-counter medications taken prior to arrival.      ROS  All other systems are reviewed and are negative for acute change except as noted in the HPI.    Allergies  Allergen Reactions   Penicillins Nausea Only     Past Medical History:  Diagnosis Date   Anxiety    Colitis 04/07/2008   seen on CT (at ER visit)   Depression    GERD (gastroesophageal reflux disease)    Hyperplastic colon polyp    Hypertension    Liver hemangioma 04/07/2008   seen on CT     Past Surgical History:  Procedure Laterality Date   "freezing of cervix"     ABDOMINAL HYSTERECTOMY     BREAST BIOPSY Left 05/15/2022   [place] LT BREAST BX W LOC DEV 1ST LESION IMG BX SPEC US GUIDE 05/15/2022 [facility] MAMMOGRAPHY   CESAREAN SECTION     NASAL SINUS SURGERY     PORTACATH PLACEMENT N/A 05/29/2022   Procedure: INSERTION PORT-A-CATH;  Surgeon: [name], MD;  Location: [facility];  Service: General;  Laterality: N/A;   right kneecap surgery +      Social History   Socioeconomic History   Marital status: Married    Spouse name: Not on file   Number of children: 0   Years of education: Not on file   Highest education level: Not on file  Occupational History   Occupation: Real[employer]  Tobacco Use   Smoking status: Never   Smokeless tobacco: Never   Tobacco comments:    smoked 1 year while in college  Vaping Use   Vaping Use: Never used  Substance and Sexual Activity   Alcohol use: Yes    Alcohol/week: 14.0 standard drinks of alcohol    Types: 14 Glasses of wine per week    Comment: wine - daily   Drug use: No   Sexual activity: Not on file  Other Topics Concern   Not on file  Social History Narrative   1 caffeine drinks daily    Social Determinants of Health   Financial Resource Strain: Not on file  Food Insecurity: Not on file  Transportation Needs: Not on file  Physical Activity: Not on file  Stress: Not on file   Social Connections: Not on file  Intimate Partner Violence: Not on file    Family History  Problem Relation Age of Onset   Kidney cancer Mother        dx after 40; early stage   Hypertension Father    Heart disease Father    Colon polyps Father    Lung cancer Father 64       smoking hx   Colon polyps Sister    Hypertension Brother    Cancer Maternal Uncle        ? blood cancer; dx after 50   Cancer Maternal Grandmother        ? gallbladder ? colon; d. after 14   Diabetes Paternal Grandmother      Current Outpatient Medications:    magic mouthwash (lidocaine, diphenhydrAMINE, alum & mag  hydroxide) suspension, Swish and spit 5 mLs 4 (four) times daily as needed for mouth pain., Disp: 360 mL, Rfl: 0   aspirin EC 81 MG tablet, Take 81 mg by mouth daily., Disp: , Rfl:    diphenoxylate-atropine (LOMOTIL) 2.5-0.025 MG tablet, TAKE 1 TABLET BY MOUTH 4 TIMES A DAY AS NEEDED FOR DIARRHEA OR LOOSE STOOLS, Disp: 30 tablet, Rfl: 3   lidocaine-prilocaine (EMLA) cream, Apply to affected area once, Disp: 30 g, Rfl: 3   lisinopril (ZESTRIL) 20 MG tablet, Take 20 mg by mouth daily., Disp: , Rfl:    loratadine (CLARITIN) 10 MG tablet, Take 10 mg by mouth daily as needed for allergies., Disp: , Rfl:    LORazepam (ATIVAN) 0.5 MG tablet, Take 1 tablet (0.5 mg total) by mouth daily as needed for anxiety., Disp: 30 tablet, Rfl: 0   ondansetron (ZOFRAN) 8 MG tablet, Take 1 tablet (8 mg total) by mouth every 8 (eight) hours as needed for nausea or vomiting. Start on the third day after chemotherapy., Disp: 30 tablet, Rfl: 1   oxyCODONE (OXY IR/ROXICODONE) 5 MG immediate release tablet, Take 1 tablet (5 mg total) by mouth every 6 (six) hours as needed for severe pain. (Patient not taking: Reported on 09/05/2022), Disp: 5 tablet, Rfl: 0   prochlorperazine (COMPAZINE) 10 MG tablet, Take 1 tablet (10 mg total) by mouth every 6 (six) hours as needed for nausea or vomiting., Disp: 30 tablet, Rfl: 1   rosuvastatin  (CRESTOR) 20 MG tablet, Take 20 mg by mouth daily., Disp: , Rfl:    sertraline (ZOLOFT) 100 MG tablet, Take 100 mg by mouth daily., Disp: , Rfl:    triamcinolone ointment (KENALOG) 0.5 %, Apply 1 Application topically 2 (two) times daily., Disp: 30 g, Rfl: 2  PHYSICAL EXAM: ECOG FS:1 - Symptomatic but completely ambulatory    Vitals:   09/22/22 1351 09/22/22 1513  BP: 95/71 112/69  Pulse: 100 79  Resp: 16   Temp: 98.2 F (36.8 C)   TempSrc: Oral   SpO2: 98%   Weight: 164 lb 14.4 oz (74.8 kg)    Physical Exam Vitals and nursing note reviewed.  Constitutional:      Appearance: She is not ill-appearing or toxic-appearing.  HENT:     Head: Normocephalic.     Mouth/Throat:     Comments: Please see media below Eyes:     Conjunctiva/sclera: Conjunctivae normal.  Cardiovascular:     Rate and Rhythm: Normal rate and regular rhythm.     Pulses: Normal pulses.     Heart sounds: Normal heart sounds.  Pulmonary:     Effort: Pulmonary effort is normal.     Breath sounds: Normal breath sounds.  Abdominal:     General: There is no distension.  Musculoskeletal:     Cervical back: Normal range of motion.  Skin:    General: Skin is warm and dry.  Neurological:     Mental Status: She is alert.        LABORATORY DATA: I have reviewed the data as listed    Latest Ref Rng & Units 09/18/2022    8:02 AM 09/09/2022    7:44 AM 09/05/2022    1:08 PM  CBC  WBC 4.0 - 10.5 K/uL 5.2  9.9  14.0   Hemoglobin 12.0 - 15.0 g/dL 9.7  7.7  8.3   Hematocrit 36.0 - 46.0 % 29.0  23.1  24.6   Platelets 150 - 400 K/uL 136  141  164  Latest Ref Rng & Units 09/18/2022    8:02 AM 09/09/2022    7:44 AM 09/05/2022    1:08 PM  CMP  Glucose 70 - 99 mg/dL 782  956  87   BUN 6 - 20 mg/dL 17  15  13    Creatinine 0.44 - 1.00 mg/dL 2.13  0.86  5.78   Sodium 135 - 145 mmol/L 142  142  140   Potassium 3.5 - 5.1 mmol/L 3.5  3.6  4.1   Chloride 98 - 111 mmol/L 107  107  107   CO2 22 - 32 mmol/L 26  27   25    Calcium 8.9 - 10.3 mg/dL 9.5  8.9  9.6   Total Protein 6.5 - 8.1 g/dL 6.9  6.6  6.9   Total Bilirubin 0.3 - 1.2 mg/dL 0.5  0.3  0.2   Alkaline Phos 38 - 126 U/L 98  120  132   AST 15 - 41 U/L 17  14  19    ALT 0 - 44 U/L 23  20  32        RADIOGRAPHIC STUDIES (from last 24 hours if applicable) I have personally reviewed the radiological images as listed and agreed with the findings in the report. No results found.      Visit Diagnosis: 1. Mucositis   2. Malignant neoplasm of upper-outer quadrant of left breast in female, estrogen receptor negative (HCC)      No orders of the defined types were placed in this encounter.   All questions were answered. The patient knows to call the clinic with any problems, questions or concerns. No barriers to learning was detected.  A total of more than 30 minutes were spent on this encounter with face-to-face time and non-face-to-face time, including preparing to see the patient, ordering tests and/or medications, counseling the patient and coordination of care as outlined above.    Thank you for allowing me to participate in the care of this patient.    Shanon Ace, PA-C Department of Hematology/Oncology [facility] Phone: [phone]  [phone]    09/22/2022 3:45 PM

## 2022-09-23 ENCOUNTER — Telehealth: Payer: Self-pay | Admitting: *Deleted

## 2022-09-23 ENCOUNTER — Other Ambulatory Visit: Payer: Self-pay | Admitting: Hematology and Oncology

## 2022-09-23 DIAGNOSIS — R928 Other abnormal and inconclusive findings on diagnostic imaging of breast: Secondary | ICD-10-CM

## 2022-09-23 DIAGNOSIS — Z171 Estrogen receptor negative status [ER-]: Secondary | ICD-10-CM

## 2022-09-23 NOTE — Telephone Encounter (Signed)
Left message for a return phone call to discuss MRI results and the need for add bx.

## 2022-09-24 ENCOUNTER — Inpatient Hospital Stay (HOSPITAL_BASED_OUTPATIENT_CLINIC_OR_DEPARTMENT_OTHER): Payer: 59 | Admitting: Adult Health

## 2022-09-24 ENCOUNTER — Encounter: Payer: Self-pay | Admitting: *Deleted

## 2022-09-24 ENCOUNTER — Telehealth: Payer: Self-pay | Admitting: *Deleted

## 2022-09-24 DIAGNOSIS — Z171 Estrogen receptor negative status [ER-]: Secondary | ICD-10-CM | POA: Diagnosis not present

## 2022-09-24 DIAGNOSIS — C50412 Malignant neoplasm of upper-outer quadrant of left female breast: Secondary | ICD-10-CM

## 2022-09-24 LAB — TRANSFUSION REACTION: DAT C3: NEGATIVE

## 2022-09-24 MED ORDER — VALACYCLOVIR HCL 1 G PO TABS
1000.0000 mg | ORAL_TABLET | Freq: Two times a day (BID) | ORAL | 0 refills | Status: DC
Start: 2022-09-24 — End: 2022-12-10

## 2022-09-24 NOTE — Progress Notes (Signed)
[facility] Cancer Follow up:    [name], MD [address] [place] [address]   DIAGNOSIS:  Cancer Staging  Malignant neoplasm of upper-outer quadrant of left breast in female, estrogen receptor negative (HCC) Staging form: Breast, AJCC 8th Edition - Clinical stage from 05/21/2022: Stage IIB (cT2, cN1, cM0, G3, ER-, PR-, HER2+) - Signed by [name], MD on 05/21/2022 Stage prefix: Initial diagnosis Histologic grading system: 3 grade system  I connected with Wynonia Hazard on 10/01/22 at 11:45 AM EDT by telephone and verified that I am speaking with the correct person using two identifiers.  I discussed the limitations, risks, security and privacy concerns of performing an evaluation and management service by telephone and the availability of in person appointments.  I also discussed with the patient that there may be a patient responsible charge related to this service. The patient expressed understanding and agreed to proceed.    SUMMARY OF ONCOLOGIC HISTORY: Oncology History  Malignant neoplasm of upper-outer quadrant of left breast in female, estrogen receptor negative (HCC)  05/15/2022 Initial Diagnosis   Palpable mass in the left breast at 1 o'clock position measured 4.2 cm ultrasound-guided biopsy revealed grade 3 IDC ER/PR negative HER2 positive Ki-67 35%, left axillary lymph node: Biopsy positive   05/21/2022 Cancer Staging   Staging form: Breast, AJCC 8th Edition - Clinical stage from 05/21/2022: Stage IIB (cT2, cN1, cM0, G3, ER-, PR-, HER2+) - Signed by [name], MD on 05/21/2022 Stage prefix: Initial diagnosis Histologic grading system: 3 grade system   05/29/2022 Genetic Testing   Negative [facility] Multi-Cancer +RNA Panel.  Report date is 05/29/2022.   The Multi-Cancer + RNA Panel offered by [facility] includes sequencing and/or deletion/duplication analysis of the following 70 genes:  AIP*, ALK, APC*, ATM*, AXIN2*, BAP1*, BARD1*,  BLM*, BMPR1A*, BRCA1*, BRCA2*, BRIP1*, CDC73*, CDH1*, CDK4, CDKN1B*, CDKN2A, CHEK2*, CTNNA1*, DICER1*, EPCAM (del/dup only), EGFR, FH*, FLCN*, GREM1 (promoter dup only), HOXB13, KIT, LZTR1, MAX*, MBD4, MEN1*, MET, MITF, MLH1*, MSH2*, MSH3*, MSH6*, MUTYH*, NF1*, NF2*, NTHL1*, PALB2*, PDGFRA, PMS2*, POLD1*, POLE*, POT1*, PRKAR1A*, PTCH1*, PTEN*, RAD51C*, RAD51D*, RB1*, RET, SDHA* (sequencing only), SDHAF2*, SDHB*, SDHC*, SDHD*, SMAD4*, SMARCA4*, SMARCB1*, SMARCE1*, STK11*, SUFU*, TMEM127*, TP53*, TSC1*, TSC2*, VHL*. RNA analysis is performed for * genes.   06/05/2022 -  Chemotherapy   Patient is on Treatment Plan : BREAST  Docetaxel + Carboplatin + Trastuzumab + Pertuzumab  (TCHP) q21d        CURRENT THERAPY: TCHP  INTERVAL HISTORY: KIMALA HORNE 62 y.o. female returns for f/u after undergoing her breast MRI to review her results and images.  The MRI on 09/19/2022 demonstrated non masslike enhancement of 2.9 cm in the left upper outer quadrant.  Superior and medial to the biopsy proven malignancy are small enhancing nodules with largest measuring 4mm.  Core biopsy was recommended if Asyia is considering breast conservation.    She wants to look at the images and also understand more about the core biopsy recommendations. She is scheduled to undergo this tomorrow.    Patient Active Problem List   Diagnosis Date Noted   Port-A-Cath in place 06/03/2022   Genetic testing 05/30/2022   Therapeutic drug monitoring 05/27/2022   Malignant neoplasm of upper-outer quadrant of left breast in female, estrogen receptor negative (HCC) 05/19/2022    is allergic to penicillins.  MEDICAL HISTORY: Past Medical History:  Diagnosis Date   Anxiety    Colitis 04/07/2008   seen on CT (at ER visit)  Depression    GERD (gastroesophageal reflux disease)    Hyperplastic colon polyp    Hypertension    Liver hemangioma 04/07/2008   seen on CT    SURGICAL HISTORY: Past Surgical History:  Procedure  Laterality Date   "freezing of cervix"     ABDOMINAL HYSTERECTOMY     BREAST BIOPSY Left 05/15/2022   [place] LT BREAST BX W LOC DEV 1ST LESION IMG BX SPEC US GUIDE 05/15/2022 [facility] MAMMOGRAPHY   CESAREAN SECTION     NASAL SINUS SURGERY     PORTACATH PLACEMENT N/A 05/29/2022   Procedure: INSERTION PORT-A-CATH;  Surgeon: [name], MD;  Location: [facility];  Service: General;  Laterality: N/A;   right kneecap surgery +      SOCIAL HISTORY: Social History   Socioeconomic History   Marital status: Married    Spouse name: Not on file   Number of children: 0   Years of education: Not on file   Highest education level: Not on file  Occupational History   Occupation: Real[employer]  Tobacco Use   Smoking status: Never   Smokeless tobacco: Never   Tobacco comments:    smoked 1 year while in college  Vaping Use   Vaping Use: Never used  Substance and Sexual Activity   Alcohol use: Yes    Alcohol/week: 14.0 standard drinks of alcohol    Types: 14 Glasses of wine per week    Comment: wine - daily   Drug use: No   Sexual activity: Not on file  Other Topics Concern   Not on file  Social History Narrative   1 caffeine drinks daily    Social Determinants of Health   Financial Resource Strain: Not on file  Food Insecurity: Not on file  Transportation Needs: Not on file  Physical Activity: Not on file  Stress: Not on file  Social Connections: Not on file  Intimate Partner Violence: Not on file    FAMILY HISTORY: Family History  Problem Relation Age of Onset   Kidney cancer Mother        dx after 16; early stage   Hypertension Father    Heart disease Father    Colon polyps Father    Lung cancer Father 71       smoking hx   Colon polyps Sister    Hypertension Brother    Cancer Maternal Uncle        ? blood cancer; dx after 50   Cancer Maternal Grandmother        ? gallbladder ? colon; d. after 60   Diabetes Paternal Grandmother     Review of Systems  Constitutional:   Negative for appetite change, chills, fatigue, fever and unexpected weight change.  HENT:   Negative for hearing loss, lump/mass and trouble swallowing.   Eyes:  Negative for eye problems and icterus.  Respiratory:  Negative for chest tightness, cough and shortness of breath.   Cardiovascular:  Negative for chest pain, leg swelling and palpitations.  Gastrointestinal:  Negative for abdominal distention, abdominal pain, constipation, diarrhea, nausea and vomiting.  Endocrine: Negative for hot flashes.  Genitourinary:  Negative for difficulty urinating.   Musculoskeletal:  Negative for arthralgias.  Skin:  Negative for itching and rash.  Neurological:  Negative for dizziness, extremity weakness, headaches and numbness.  Hematological:  Negative for adenopathy. Does not bruise/bleed easily.  Psychiatric/Behavioral:  Negative for depression. The patient is not nervous/anxious.       PHYSICAL EXAMINATION Patient appears well.  She is in no apparent distress.  Mood and behavior are normal, speech is normal, breathing is non labored.        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  Treatment plan: 1. Neoadjuvant chemotherapy with TCH Perjeta 6 cycles followed by Herceptin Perjeta maintenance versus Kadcyla maintenance (based on response to neoadjuvant chemo) for 1 year 2. Followed by breast conserving surgery if possible with sentinel lymph node study 3. Followed by adjuvant radiation therapy ------------------------------------------------------------------------ Coni is a 62 year old woman receiving treatment with Abraxane, Carbo, Herceptin, Perjeta.    Stage IIb HER2 positive  breast cancer: She has completed her neoadjuvant chemotherapy.  We reviewed her MRI images and I screen shared with her today.  She is going to proceed with MR guided biopsy.  She will f/u with Dr. Donell Beers on 6/24 as scheduled  Fatigue: Continuing to improve.  Recommended she continue with energy conservation.  Lainey will return as scheduled for labs, follow-up, and Herceptin Perjeta.  She knows to call for any questions or concerns that may arise between now and her next visit with [place].     All questions were answered. The patient knows to call the clinic with any problems, questions or concerns. We can certainly see the patient much sooner if necessary.   The patient was provided an opportunity to ask questions and all were answered. The patient agreed with the plan and demonstrated an understanding of the instructions.   The patient was advised to call back or seek an in-person evaluation if the symptoms worsen or if the condition fails to improve as anticipated.   I provided 30 minutes of face-to-face video visit time during this encounter, and > 50% was spent counseling as documented under my assessment & plan.  Lillard Anes, NP 10/01/22 3:49 PM Medical Oncology and Hematology [facility] [address] [place], [place] [address] Tel. [phone]    Fax. [phone]

## 2022-09-24 NOTE — Progress Notes (Signed)
Urgent referral faxed to Dr. Leta Baptist per NP request to [phone] with receipt confirmation.

## 2022-09-24 NOTE — Telephone Encounter (Signed)
Received call back from patient regarding her need for add bx.  She is scheduled for 6/20 and aware.  She will see Mardella Layman today to review MRI.

## 2022-09-25 ENCOUNTER — Other Ambulatory Visit (HOSPITAL_COMMUNITY): Payer: Self-pay | Admitting: Diagnostic Radiology

## 2022-09-25 ENCOUNTER — Other Ambulatory Visit: Payer: Self-pay

## 2022-09-25 ENCOUNTER — Ambulatory Visit
Admission: RE | Admit: 2022-09-25 | Discharge: 2022-09-25 | Disposition: A | Discharge status: Home / Self Care | Payer: 59 | Source: Ambulatory Visit | Attending: Hematology and Oncology | Admitting: Hematology and Oncology

## 2022-09-25 ENCOUNTER — Inpatient Hospital Stay: Payer: 59

## 2022-09-25 ENCOUNTER — Encounter: Payer: Self-pay | Admitting: Physician Assistant

## 2022-09-25 DIAGNOSIS — C50412 Malignant neoplasm of upper-outer quadrant of left female breast: Secondary | ICD-10-CM

## 2022-09-25 DIAGNOSIS — R928 Other abnormal and inconclusive findings on diagnostic imaging of breast: Secondary | ICD-10-CM

## 2022-09-25 MED ORDER — GADOPICLENOL 0.5 MMOL/ML IV SOLN
8.0000 mL | Freq: Once | INTRAVENOUS | Status: AC | PRN
  Administered 2022-09-25: 8 mL via INTRAVENOUS

## 2022-09-26 ENCOUNTER — Encounter: Payer: Self-pay | Admitting: *Deleted

## 2022-09-29 ENCOUNTER — Inpatient Hospital Stay: Payer: 59

## 2022-09-29 ENCOUNTER — Other Ambulatory Visit: Payer: Self-pay | Admitting: General Surgery

## 2022-09-29 ENCOUNTER — Other Ambulatory Visit: Payer: Self-pay

## 2022-09-29 VITALS — BP 106/67 | HR 78 | Temp 98.5°F | Resp 16

## 2022-09-29 DIAGNOSIS — Z5112 Encounter for antineoplastic immunotherapy: Secondary | ICD-10-CM | POA: Diagnosis not present

## 2022-09-29 DIAGNOSIS — Z171 Estrogen receptor negative status [ER-]: Secondary | ICD-10-CM

## 2022-09-29 MED ORDER — SODIUM CHLORIDE 0.9 % IV SOLN: Freq: Once | INTRAVENOUS | Status: AC

## 2022-09-29 NOTE — Patient Instructions (Signed)

## 2022-09-30 ENCOUNTER — Other Ambulatory Visit: Payer: Self-pay | Admitting: General Surgery

## 2022-09-30 DIAGNOSIS — Z171 Estrogen receptor negative status [ER-]: Secondary | ICD-10-CM

## 2022-10-01 ENCOUNTER — Encounter: Payer: Self-pay | Admitting: Hematology and Oncology

## 2022-10-01 ENCOUNTER — Other Ambulatory Visit: Payer: Self-pay

## 2022-10-01 NOTE — Assessment & Plan Note (Signed)
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  Treatment plan: 1. Neoadjuvant chemotherapy with TCH Perjeta 6 cycles followed by Herceptin Perjeta maintenance versus Kadcyla maintenance (based on response to neoadjuvant chemo) for 1 year 2. Followed by breast conserving surgery if possible with sentinel lymph node study 3. Followed by adjuvant radiation therapy ------------------------------------------------------------------------ Erika [name] is a 62 year old woman receiving treatment with Abraxane, Carbo, Herceptin, Perjeta.    Stage IIb HER2 positive breast cancer: She has completed her neoadjuvant chemotherapy.  We reviewed her MRI images and I screen shared with her today.  She is going to proceed with MR guided biopsy.  She will f/u with Dr. Donell Beers on 6/24 as scheduled  Fatigue: Continuing to improve.  Recommended she continue with energy conservation.  Erika [name] will return as scheduled for labs, follow-up, and Herceptin Perjeta.  She knows to call for any questions or concerns that may arise between now and her next visit with [place].

## 2022-10-02 ENCOUNTER — Other Ambulatory Visit: Payer: Self-pay

## 2022-10-04 NOTE — Progress Notes (Signed)
Patient Care Team: [name], MD as PCP - General (Hematology and Oncology) [name], MD as Consulting Physician (General Surgery) [name], MD as Consulting Physician (Hematology and Oncology) Antony Blackbird, MD as Consulting Physician (Radiation Oncology) Donnelly Angelica, RN as Oncology Nurse Navigator Pershing Proud, RN as Oncology Nurse Navigator  DIAGNOSIS: No diagnosis found.  SUMMARY OF ONCOLOGIC HISTORY: Oncology History  Malignant neoplasm of upper-outer quadrant of left breast in female, estrogen receptor negative (HCC)  05/15/2022 Initial Diagnosis   Palpable mass in the left breast at 1 o'clock position measured 4.2 cm ultrasound-guided biopsy revealed grade 3 IDC ER/PR negative HER2 positive Ki-67 35%, left axillary lymph node: Biopsy positive   05/21/2022 Cancer Staging   Staging form: Breast, AJCC 8th Edition - Clinical stage from 05/21/2022: Stage IIB (cT2, cN1, cM0, G3, ER-, PR-, HER2+) - Signed by [name], MD on 05/21/2022 Stage prefix: Initial diagnosis Histologic grading system: 3 grade system   05/29/2022 Genetic Testing   Negative [facility] Multi-Cancer +RNA Panel.  Report date is 05/29/2022.   The Multi-Cancer + RNA Panel offered by [facility] includes sequencing and/or deletion/duplication analysis of the following 70 genes:  AIP*, ALK, APC*, ATM*, AXIN2*, BAP1*, BARD1*, BLM*, BMPR1A*, BRCA1*, BRCA2*, BRIP1*, CDC73*, CDH1*, CDK4, CDKN1B*, CDKN2A, CHEK2*, CTNNA1*, DICER1*, EPCAM (del/dup only), EGFR, FH*, FLCN*, GREM1 (promoter dup only), HOXB13, KIT, LZTR1, MAX*, MBD4, MEN1*, MET, MITF, MLH1*, MSH2*, MSH3*, MSH6*, MUTYH*, NF1*, NF2*, NTHL1*, PALB2*, PDGFRA, PMS2*, POLD1*, POLE*, POT1*, PRKAR1A*, PTCH1*, PTEN*, RAD51C*, RAD51D*, RB1*, RET, SDHA* (sequencing only), SDHAF2*, SDHB*, SDHC*, SDHD*, SMAD4*, SMARCA4*, SMARCB1*, SMARCE1*, STK11*, SUFU*, TMEM127*, TP53*, TSC1*, TSC2*, VHL*. RNA analysis is performed for * genes.   06/05/2022 -  Chemotherapy    Patient is on Treatment Plan : BREAST  Docetaxel + Carboplatin + Trastuzumab + Pertuzumab  (TCHP) q21d        CHIEF COMPLIANT: Follow-up left breast cancer  INTERVAL HISTORY: Erika [name] is a  62 y.o. female is here because of recent diagnosis of Left Breast cancer. She presents to the clinic for a follow-up.    ALLERGIES:  is allergic to penicillins.  MEDICATIONS:  Current Outpatient Medications  Medication Sig Dispense Refill   aspirin EC 81 MG tablet Take 81 mg by mouth daily.     diphenoxylate-atropine (LOMOTIL) 2.5-0.025 MG tablet TAKE 1 TABLET BY MOUTH 4 TIMES A DAY AS NEEDED FOR DIARRHEA OR LOOSE STOOLS 30 tablet 3   lidocaine-prilocaine (EMLA) cream Apply to affected area once 30 g 3   lisinopril (ZESTRIL) 20 MG tablet Take 20 mg by mouth daily.     loratadine (CLARITIN) 10 MG tablet Take 10 mg by mouth daily as needed for allergies.     LORazepam (ATIVAN) 0.5 MG tablet Take 1 tablet (0.5 mg total) by mouth daily as needed for anxiety. 30 tablet 0   magic mouthwash (lidocaine, diphenhydrAMINE, alum & mag hydroxide) suspension Swish and spit 5 mLs 4 (four) times daily as needed for mouth pain. 360 mL 0   ondansetron (ZOFRAN) 8 MG tablet Take 1 tablet (8 mg total) by mouth every 8 (eight) hours as needed for nausea or vomiting. Start on the third day after chemotherapy. 30 tablet 1   oxyCODONE (OXY IR/ROXICODONE) 5 MG immediate release tablet Take 1 tablet (5 mg total) by mouth every 6 (six) hours as needed for severe pain. (Patient not taking: Reported on 09/05/2022) 5 tablet 0   prochlorperazine (COMPAZINE) 10 MG tablet Take 1 tablet (10 mg total) by  mouth every 6 (six) hours as needed for nausea or vomiting. 30 tablet 1   rosuvastatin (CRESTOR) 20 MG tablet Take 20 mg by mouth daily.     sertraline (ZOLOFT) 100 MG tablet Take 100 mg by mouth daily.     triamcinolone ointment (KENALOG) 0.5 % Apply 1 Application topically 2 (two) times daily. 30 g 2   valACYclovir (VALTREX) 1000  MG tablet Take 1 tablet (1,000 mg total) by mouth 2 (two) times daily. 20 tablet 0   No current facility-administered medications for this visit.    PHYSICAL EXAMINATION: ECOG PERFORMANCE STATUS: {CHL ONC ECOG [phone]}  There were no vitals filed for this visit. There were no vitals filed for this visit.  BREAST:*** No palpable masses or nodules in either right or left breasts. No palpable axillary supraclavicular or infraclavicular adenopathy no breast tenderness or nipple discharge. (exam performed in the presence of a chaperone)  LABORATORY DATA:  I have reviewed the data as listed    Latest Ref Rng & Units 09/18/2022    8:02 AM 09/09/2022    7:44 AM 09/05/2022    1:08 PM  CMP  Glucose 70 - 99 mg/dL 161  096  87   BUN 6 - 20 mg/dL 17  15  13    Creatinine 0.44 - 1.00 mg/dL 0.45  4.09  8.11   Sodium 135 - 145 mmol/L 142  142  140   Potassium 3.5 - 5.1 mmol/L 3.5  3.6  4.1   Chloride 98 - 111 mmol/L 107  107  107   CO2 22 - 32 mmol/L 26  27  25    Calcium 8.9 - 10.3 mg/dL 9.5  8.9  9.6   Total Protein 6.5 - 8.1 g/dL 6.9  6.6  6.9   Total Bilirubin 0.3 - 1.2 mg/dL 0.5  0.3  0.2   Alkaline Phos 38 - 126 U/L 98  120  132   AST 15 - 41 U/L 17  14  19    ALT 0 - 44 U/L 23  20  32     Lab Results  Component Value Date   WBC 5.2 09/18/2022   HGB 9.7 (L) 09/18/2022   HCT 29.0 (L) 09/18/2022   MCV 102.8 (H) 09/18/2022   PLT 136 (L) 09/18/2022   NEUTROABS 2.2 09/18/2022    ASSESSMENT & PLAN:  No problem-specific Assessment & Plan notes found for this encounter.    No orders of the defined types were placed in this encounter.  The patient has a good understanding of the overall plan. she agrees with it. she will call with any problems that may develop before the next visit here. Total time spent: 30 mins including face to face time and time spent for planning, charting and co-ordination of care   Sherlyn Lick, CMA 10/04/22    I Janan Ridge am acting as a  Neurosurgeon for [employer]  ***

## 2022-10-06 ENCOUNTER — Encounter: Payer: Self-pay | Admitting: *Deleted

## 2022-10-07 ENCOUNTER — Inpatient Hospital Stay: Payer: 59 | Attending: Hematology and Oncology | Admitting: Hematology and Oncology

## 2022-10-07 ENCOUNTER — Inpatient Hospital Stay: Payer: 59

## 2022-10-07 ENCOUNTER — Other Ambulatory Visit: Payer: Self-pay

## 2022-10-07 VITALS — BP 115/56 | HR 84 | Temp 97.5°F | Resp 18 | Ht 68.0 in | Wt 162.2 lb

## 2022-10-07 DIAGNOSIS — C50412 Malignant neoplasm of upper-outer quadrant of left female breast: Secondary | ICD-10-CM

## 2022-10-07 DIAGNOSIS — Z5112 Encounter for antineoplastic immunotherapy: Secondary | ICD-10-CM | POA: Diagnosis present

## 2022-10-07 DIAGNOSIS — D649 Anemia, unspecified: Secondary | ICD-10-CM | POA: Insufficient documentation

## 2022-10-07 DIAGNOSIS — Z79899 Other long term (current) drug therapy: Secondary | ICD-10-CM | POA: Diagnosis not present

## 2022-10-07 DIAGNOSIS — Z79624 Long term (current) use of inhibitors of nucleotide synthesis: Secondary | ICD-10-CM | POA: Diagnosis not present

## 2022-10-07 DIAGNOSIS — Z95828 Presence of other vascular implants and grafts: Secondary | ICD-10-CM

## 2022-10-07 DIAGNOSIS — Z171 Estrogen receptor negative status [ER-]: Secondary | ICD-10-CM | POA: Diagnosis not present

## 2022-10-07 DIAGNOSIS — R197 Diarrhea, unspecified: Secondary | ICD-10-CM | POA: Insufficient documentation

## 2022-10-07 DIAGNOSIS — Z7982 Long term (current) use of aspirin: Secondary | ICD-10-CM | POA: Insufficient documentation

## 2022-10-07 DIAGNOSIS — Z90710 Acquired absence of both cervix and uterus: Secondary | ICD-10-CM | POA: Insufficient documentation

## 2022-10-07 LAB — CBC WITH DIFFERENTIAL (CANCER CENTER ONLY)
Abs Immature Granulocytes: 0.01 10*3/uL (ref 0.00–0.07)
Basophils Absolute: 0 10*3/uL (ref 0.0–0.1)
Basophils Relative: 0 %
Eosinophils Absolute: 0 10*3/uL (ref 0.0–0.5)
Eosinophils Relative: 1 %
HCT: 20.4 % — ABNORMAL LOW (ref 36.0–46.0)
Hemoglobin: 7 g/dL — ABNORMAL LOW (ref 12.0–15.0)
Immature Granulocytes: 0 %
Lymphocytes Relative: 44 %
Lymphs Abs: 1.9 10*3/uL (ref 0.7–4.0)
MCH: 35.5 pg — ABNORMAL HIGH (ref 26.0–34.0)
MCHC: 34.3 g/dL (ref 30.0–36.0)
MCV: 103.6 fL — ABNORMAL HIGH (ref 80.0–100.0)
Monocytes Absolute: 0.5 10*3/uL (ref 0.1–1.0)
Monocytes Relative: 12 %
Neutro Abs: 1.8 10*3/uL (ref 1.7–7.7)
Neutrophils Relative %: 43 %
Platelet Count: 55 10*3/uL — ABNORMAL LOW (ref 150–400)
RBC: 1.97 MIL/uL — ABNORMAL LOW (ref 3.87–5.11)
RDW: 18.3 % — ABNORMAL HIGH (ref 11.5–15.5)
WBC Count: 4.2 10*3/uL (ref 4.0–10.5)
nRBC: 0 % (ref 0.0–0.2)

## 2022-10-07 LAB — CMP (CANCER CENTER ONLY)
ALT: 19 U/L (ref 0–44)
AST: 17 U/L (ref 15–41)
Albumin: 3.9 g/dL (ref 3.5–5.0)
Alkaline Phosphatase: 98 U/L (ref 38–126)
Anion gap: 6 (ref 5–15)
BUN: 13 mg/dL (ref 6–20)
CO2: 27 mmol/L (ref 22–32)
Calcium: 8.9 mg/dL (ref 8.9–10.3)
Chloride: 107 mmol/L (ref 98–111)
Creatinine: 0.77 mg/dL (ref 0.44–1.00)
GFR, Estimated: >60 mL/min (ref >60)
Glucose, Bld: 83 mg/dL (ref 70–99)
Potassium: 4 mmol/L (ref 3.5–5.1)
Sodium: 140 mmol/L (ref 135–145)
Total Bilirubin: 0.3 mg/dL (ref 0.3–1.2)
Total Protein: 6.1 g/dL — ABNORMAL LOW (ref 6.5–8.1)

## 2022-10-07 LAB — PREPARE RBC (CROSSMATCH)

## 2022-10-07 LAB — SAMPLE TO BLOOD BANK

## 2022-10-07 MED ORDER — HEPARIN SOD (PORK) LOCK FLUSH 100 UNIT/ML IV SOLN
500.0000 [IU] | Freq: Once | INTRAVENOUS | Status: AC | PRN
  Administered 2022-10-07: 500 [IU]

## 2022-10-07 MED ORDER — TRASTUZUMAB-ANNS CHEMO 150 MG IV SOLR
420.0000 mg | Freq: Once | INTRAVENOUS | Status: AC
  Administered 2022-10-07: 420 mg via INTRAVENOUS
  Filled 2022-10-07: qty 20

## 2022-10-07 MED ORDER — SODIUM CHLORIDE 0.9 % IV SOLN: Freq: Once | INTRAVENOUS | Status: AC

## 2022-10-07 MED ORDER — SODIUM CHLORIDE 0.9% FLUSH
10.0000 mL | Freq: Once | INTRAVENOUS | Status: AC
  Administered 2022-10-07: 10 mL

## 2022-10-07 MED ORDER — SODIUM CHLORIDE 0.9 % IV SOLN
420.0000 mg | Freq: Once | INTRAVENOUS | Status: AC
  Administered 2022-10-07: 420 mg via INTRAVENOUS
  Filled 2022-10-07: qty 14

## 2022-10-07 MED ORDER — SODIUM CHLORIDE 0.9% FLUSH
10.0000 mL | INTRAVENOUS | Status: DC | PRN
  Administered 2022-10-07: 10 mL

## 2022-10-07 MED ORDER — ACETAMINOPHEN 325 MG PO TABS
650.0000 mg | ORAL_TABLET | Freq: Once | ORAL | Status: AC
  Administered 2022-10-07: 650 mg via ORAL
  Filled 2022-10-07: qty 2

## 2022-10-07 MED ORDER — LORAZEPAM 2 MG/ML IJ SOLN
0.5000 mg | Freq: Once | INTRAMUSCULAR | Status: AC
  Administered 2022-10-07: 0.5 mg via INTRAVENOUS
  Filled 2022-10-07: qty 1

## 2022-10-07 NOTE — Progress Notes (Signed)
Oral tylenol and IV pepcid ordered under signed and held for pre-meds to go alongside 1 unit RBC on 10/08/22.

## 2022-10-07 NOTE — Assessment & Plan Note (Signed)
stage IIB ER/PR negative, HER2 positive breast cancer here today for evaluation and follow-up prior to receiving cycle 6 of neoadjuvant Abraxane, carboplatin, Herceptin, and Perjeta.   Treatment plan: 1. Neoadjuvant chemotherapy with TCH Perjeta 6 cycles completed 09/18/2022 (Taxotere switched with Abraxane) followed by Herceptin Perjeta maintenance versus Kadcyla maintenance (based on response to neoadjuvant chemo) for 1 year 2. Followed by breast conserving surgery if possible with sentinel lymph node study 3. Followed by adjuvant radiation therapy ------------------------------------------------------------------------ 09/24/2022: Breast MRI: Previously seen rim-enhancing mass UOQ left breast is now non-mass enhancement measuring 2.9 cm small enhancing nodules measuring 4 mm (biopsy 09/25/2022: Benign)  Continue with the Herceptin Perjeta maintenance until surgery gets done and we can discuss continuing with the Herceptin Perjeta maintenance versus Kadcyla.  Also participation in the [employer] HER2 clinical trial.

## 2022-10-07 NOTE — Progress Notes (Signed)
Prepare transfusion and T&S orders released per blood bank request for 10/08/22 transfusion.

## 2022-10-07 NOTE — Patient Instructions (Signed)
[facility]  Discharge Instructions: Thank you for choosing [facility] to provide your oncology and hematology care.   If you have a lab appointment with the Cancer Center, please go directly to the Cancer Center and check in at the registration area.   Wear comfortable clothing and clothing appropriate for easy access to any Portacath or PICC line.   We strive to give you quality time with your provider. You may need to reschedule your appointment if you arrive late (15 or more minutes).  Arriving late affects you and other patients whose appointments are after yours.  Also, if you miss three or more appointments without notifying the office, you may be dismissed from the clinic at the provider's discretion.      For prescription refill requests, have your pharmacy contact our office and allow 72 hours for refills to be completed.    Today you received the following chemotherapy and/or immunotherapy agents Kanjinti, Perjeta.      To help prevent nausea and vomiting after your treatment, we encourage you to take your nausea medication as directed.  BELOW ARE SYMPTOMS THAT SHOULD BE REPORTED IMMEDIATELY: *FEVER GREATER THAN 100.4 F (38 C) OR HIGHER *CHILLS OR SWEATING *NAUSEA AND VOMITING THAT IS NOT CONTROLLED WITH YOUR NAUSEA MEDICATION *UNUSUAL SHORTNESS OF BREATH *UNUSUAL BRUISING OR BLEEDING *URINARY PROBLEMS (pain or burning when urinating, or frequent urination) *BOWEL PROBLEMS (unusual diarrhea, constipation, pain near the anus) TENDERNESS IN MOUTH AND THROAT WITH OR WITHOUT PRESENCE OF ULCERS (sore throat, sores in mouth, or a toothache) UNUSUAL RASH, SWELLING OR PAIN  UNUSUAL VAGINAL DISCHARGE OR ITCHING   Items with * indicate a potential emergency and should be followed up as soon as possible or go to the Emergency Department if any problems should occur.  Please show the CHEMOTHERAPY ALERT CARD or IMMUNOTHERAPY ALERT CARD  at check-in to the Emergency Department and triage nurse.  Should you have questions after your visit or need to cancel or reschedule your appointment, please contact [facility]  Dept: [phone]  and follow the prompts.  Office hours are 8:00 a.m. to 4:30 p.m. Monday - Friday. Please note that voicemails left after 4:00 p.m. may not be returned until the following business day.  We are closed weekends and major holidays. You have access to a nurse at all times for urgent questions. Please call the main number to the clinic Dept: [phone] and follow the prompts.   For any non-urgent questions, you may also contact your provider using MyChart. We now offer e-Visits for anyone 18 and older to request care online for non-urgent symptoms. For details visit [email or web address].   Also download the MyChart app! Go to the app store, search "MyChart", open the app, select , and log in with your MyChart username and password.   

## 2022-10-07 NOTE — Progress Notes (Signed)
Ordered 1 unit RBC for symptomatic anemia per MD. IV tylenol/benadryl/pepcid ordered under signed and held as pre-medication for RBC d/t previous transfusion reaction.

## 2022-10-08 ENCOUNTER — Telehealth: Payer: Self-pay | Admitting: Hematology and Oncology

## 2022-10-08 ENCOUNTER — Inpatient Hospital Stay: Payer: 59

## 2022-10-08 VITALS — BP 105/66 | HR 68 | Temp 97.8°F | Resp 16

## 2022-10-08 DIAGNOSIS — Z5112 Encounter for antineoplastic immunotherapy: Secondary | ICD-10-CM | POA: Diagnosis not present

## 2022-10-08 DIAGNOSIS — C50412 Malignant neoplasm of upper-outer quadrant of left female breast: Secondary | ICD-10-CM

## 2022-10-08 LAB — BPAM RBC

## 2022-10-08 LAB — TYPE AND SCREEN

## 2022-10-08 MED ORDER — HEPARIN SOD (PORK) LOCK FLUSH 100 UNIT/ML IV SOLN
500.0000 [IU] | Freq: Once | INTRAVENOUS | Status: AC | PRN
  Administered 2022-10-08: 500 [IU]

## 2022-10-08 MED ORDER — HEPARIN SOD (PORK) LOCK FLUSH 100 UNIT/ML IV SOLN: 250.0000 [IU] | INTRAVENOUS | Status: AC | PRN

## 2022-10-08 MED ORDER — DIPHENHYDRAMINE HCL 50 MG/ML IJ SOLN
25.0000 mg | Freq: Once | INTRAMUSCULAR | Status: AC
  Administered 2022-10-08: 25 mg via INTRAVENOUS
  Filled 2022-10-08: qty 1

## 2022-10-08 MED ORDER — SODIUM CHLORIDE 0.9% FLUSH
10.0000 mL | INTRAVENOUS | Status: AC | PRN
  Administered 2022-10-08: 10 mL

## 2022-10-08 MED ORDER — SODIUM CHLORIDE 0.9% IV SOLUTION
250.0000 mL | Freq: Once | INTRAVENOUS | Status: AC
  Administered 2022-10-08: 250 mL via INTRAVENOUS

## 2022-10-08 MED ORDER — ACETAMINOPHEN 325 MG PO TABS
650.0000 mg | ORAL_TABLET | Freq: Once | ORAL | Status: AC
  Administered 2022-10-08: 650 mg via ORAL
  Filled 2022-10-08: qty 2

## 2022-10-08 MED ORDER — SODIUM CHLORIDE 0.9% FLUSH: 3.0000 mL | INTRAVENOUS | Status: AC | PRN

## 2022-10-08 NOTE — Patient Instructions (Signed)
Blood Transfusion, Adult A blood transfusion is a procedure in which you receive blood or a type of blood cell (blood component) through an IV. You may need a blood transfusion when you have a low blood count, which is a low number of any blood cell. This may result from a bleeding disorder, illness, injury, or surgery. The blood may come from a donor, or you may be able to have your own blood collected and stored (autologous blood donation) before a planned surgery. The blood given in a transfusion may be made up of different blood components. You may receive: Red blood cells. These carry oxygen to the cells in the body. Platelets. These help your blood to clot. Plasma. This is the liquid part of your blood. It carries proteins and other substances throughout the body. White blood cells. These help you fight infections. If you have hemophilia or another clotting disorder, you may also receive other types of blood products. Depending on the type of blood product, this procedure may take 1-4 hours to complete. Tell a health care provider about: Any bleeding problems you have. Any previous reactions you have had during a blood transfusion. Any allergies you have. All medicines you are taking, including vitamins, herbs, eye drops, creams, and over-the-counter medicines. Any surgeries you have had. Any medical conditions you have. Whether you are pregnant or may be pregnant. What are the risks? Talk with your health care provider about risks. The most common problems include: A mild allergic reaction, such as red, swollen areas of skin (hives) and itching. Fever or chills. This may be the body's response to new blood cells received. This may occur during or up to 4 hours after the transfusion. More serious problems may include: A serious allergic reaction that causes difficulty breathing or swelling around the face and lips. Transfusion-associated circulatory overload (TACO), or too much fluid in  the lungs. This may cause breathing problems. Transfusion-related acute lung injury (TRALI), which causes breathing difficulty and low oxygen in the blood. This can occur within hours of the transfusion or several days later. Iron overload. This can happen after receiving many blood transfusions over a period of time. Infection or virus being transmitted. This is rare because donated blood is carefully tested before it is given. Hemolytic transfusion reaction. This is rare. It happens when the body's defense system (immune system)tries to attack the new blood cells. Symptoms may include fever, chills, nausea, low blood pressure, and low back or chest pain. Transfusion-associated graft-versus-host disease (TAGVHD). This is rare. It happens when donated cells attack the body's healthy tissues. What happens before the procedure? You will have a blood test to check your blood type. This test is done to know what kind of blood your body will accept and to match it to the donor blood. If you are going to have a planned surgery, you may be able to do an autologous blood donation. This may be done in case you need to have a transfusion. You will have your temperature, blood pressure, and pulse checked before the transfusion. If you have had an allergic reaction to a transfusion in the past, you may be given medicine to help prevent a reaction. This medicine may be given to you by mouth (orally) or through an IV. What happens during the procedure?  An IV will be inserted into one of your veins. The bag of blood will be attached to your IV. The blood will then enter through your vein. Your temperature, blood pressure,   and pulse will be monitored during the transfusion. This monitoring is done to detect early signs of a transfusion reaction. Tell your nurse right away if you have any of these symptoms during the transfusion: Shortness of breath or trouble breathing. Chest or back pain. Fever or  chills. Itching or hives. If you have any signs or symptoms of a reaction, your transfusion will be stopped and you may be given medicine. When the transfusion is complete, your IV will be removed. Pressure may be applied to the IV site for a few minutes. A bandage (dressing)will be applied. The procedure may vary among health care providers and hospitals. What happens after the procedure? Your temperature, blood pressure, pulse, breathing rate, and blood oxygen level will be monitored until you leave the hospital or clinic. Your blood may be tested to see how you have responded to the transfusion. You may be warmed with fluids or blankets to maintain a normal body temperature. If you receive your blood transfusion in an outpatient setting, you will be told whom to contact to report any reactions. Where to find more information Visit the [employer]: redcross.org Summary A blood transfusion is a procedure in which you receive blood or a type of blood cell (blood component) through an IV. The blood given in a transfusion may be made up of different blood components. You may receive red blood cells, platelets, plasma, or white blood cells depending on the condition treated. Your temperature, blood pressure, and pulse will be monitored before, during, and after the transfusion. After the transfusion, your blood may be tested to see how your body has responded. This information is not intended to replace advice given to you by your health care provider. Make sure you discuss any questions you have with your health care provider. Document Revised: 06/21/2021 Document Reviewed: 06/21/2021 Elsevier Patient Education  2024 Elsevier Inc.   

## 2022-10-08 NOTE — Telephone Encounter (Signed)
Scheduled appointment per 7/2 los. Patient is aware of the made appointments.

## 2022-10-08 NOTE — Progress Notes (Unsigned)
Pt requested 2nd unit of blood b/c she was tired of feeling so fatigued & states that Dr Pamelia Hoit had wanted her to get 2 units when she saw him but ordered 1 due to holiday & unable to fit in schedule.  Charge nurse said we could do 2nd unit if MD orders.  Dr Pamelia Hoit wanted to just do 1 unit.  Pt expressed understanding.

## 2022-10-09 ENCOUNTER — Other Ambulatory Visit: Payer: Self-pay

## 2022-10-09 LAB — TYPE AND SCREEN
ABO/RH(D): O POS
Antibody Screen: POSITIVE
Donor AG Type: NEGATIVE
PT AG Type: NEGATIVE
Unit division: 0

## 2022-10-09 LAB — BPAM RBC
Blood Product Expiration Date: 202407272359
ISSUE DATE / TIME: 202407031342
Unit Type and Rh: 5100

## 2022-10-10 ENCOUNTER — Ambulatory Visit: Payer: 59

## 2022-10-14 ENCOUNTER — Encounter: Payer: Self-pay | Admitting: Physician Assistant

## 2022-10-15 ENCOUNTER — Other Ambulatory Visit: Payer: Self-pay

## 2022-10-17 ENCOUNTER — Encounter: Payer: Self-pay | Admitting: Hematology and Oncology

## 2022-10-22 ENCOUNTER — Other Ambulatory Visit: Payer: Self-pay | Admitting: *Deleted

## 2022-10-22 ENCOUNTER — Encounter: Payer: Self-pay | Admitting: Hematology and Oncology

## 2022-10-22 ENCOUNTER — Telehealth: Payer: Self-pay | Admitting: *Deleted

## 2022-10-22 DIAGNOSIS — Z171 Estrogen receptor negative status [ER-]: Secondary | ICD-10-CM

## 2022-10-22 NOTE — Telephone Encounter (Signed)
Pt sent mychart message with concerns about episodes of diarrhea for several days. Pt was scheduled with [facility] for 7/18. Pt called and left vmail of scheduled date and time for appts. Advised to call back if she has a scheduling conflict

## 2022-10-23 ENCOUNTER — Inpatient Hospital Stay: Payer: 59

## 2022-10-23 ENCOUNTER — Other Ambulatory Visit: Payer: Self-pay

## 2022-10-23 ENCOUNTER — Inpatient Hospital Stay: Payer: 59 | Admitting: Physician Assistant

## 2022-10-23 ENCOUNTER — Telehealth: Payer: Self-pay | Admitting: *Deleted

## 2022-10-23 VITALS — BP 126/79 | HR 60 | Resp 16

## 2022-10-23 DIAGNOSIS — Z171 Estrogen receptor negative status [ER-]: Secondary | ICD-10-CM | POA: Diagnosis not present

## 2022-10-23 DIAGNOSIS — Z5112 Encounter for antineoplastic immunotherapy: Secondary | ICD-10-CM | POA: Diagnosis not present

## 2022-10-23 DIAGNOSIS — Z95828 Presence of other vascular implants and grafts: Secondary | ICD-10-CM

## 2022-10-23 DIAGNOSIS — R197 Diarrhea, unspecified: Secondary | ICD-10-CM

## 2022-10-23 DIAGNOSIS — C50412 Malignant neoplasm of upper-outer quadrant of left female breast: Secondary | ICD-10-CM | POA: Diagnosis not present

## 2022-10-23 LAB — CBC WITH DIFFERENTIAL (CANCER CENTER ONLY)
Abs Immature Granulocytes: 0.01 10*3/uL (ref 0.00–0.07)
Basophils Absolute: 0 10*3/uL (ref 0.0–0.1)
Basophils Relative: 0 %
Eosinophils Absolute: 0.1 10*3/uL (ref 0.0–0.5)
Eosinophils Relative: 2 %
HCT: 28.2 % — ABNORMAL LOW (ref 36.0–46.0)
Hemoglobin: 9.4 g/dL — ABNORMAL LOW (ref 12.0–15.0)
Immature Granulocytes: 0 %
Lymphocytes Relative: 43 %
Lymphs Abs: 2 10*3/uL (ref 0.7–4.0)
MCH: 33.8 pg (ref 26.0–34.0)
MCHC: 33.3 g/dL (ref 30.0–36.0)
MCV: 101.4 fL — ABNORMAL HIGH (ref 80.0–100.0)
Monocytes Absolute: 0.4 10*3/uL (ref 0.1–1.0)
Monocytes Relative: 9 %
Neutro Abs: 2.1 10*3/uL (ref 1.7–7.7)
Neutrophils Relative %: 46 %
Platelet Count: 318 10*3/uL (ref 150–400)
RBC: 2.78 MIL/uL — ABNORMAL LOW (ref 3.87–5.11)
RDW: 20.3 % — ABNORMAL HIGH (ref 11.5–15.5)
WBC Count: 4.7 10*3/uL (ref 4.0–10.5)
nRBC: 0 % (ref 0.0–0.2)

## 2022-10-23 LAB — CMP (CANCER CENTER ONLY)
ALT: 22 U/L (ref 0–44)
AST: 19 U/L (ref 15–41)
Albumin: 4.2 g/dL (ref 3.5–5.0)
Alkaline Phosphatase: 91 U/L (ref 38–126)
Anion gap: 8 (ref 5–15)
BUN: 15 mg/dL (ref 6–20)
CO2: 27 mmol/L (ref 22–32)
Calcium: 9.6 mg/dL (ref 8.9–10.3)
Chloride: 108 mmol/L (ref 98–111)
Creatinine: 0.82 mg/dL (ref 0.44–1.00)
GFR, Estimated: >60 mL/min (ref >60)
Glucose, Bld: 84 mg/dL (ref 70–99)
Potassium: 3.7 mmol/L (ref 3.5–5.1)
Sodium: 143 mmol/L (ref 135–145)
Total Bilirubin: 0.3 mg/dL (ref 0.3–1.2)
Total Protein: 6.8 g/dL (ref 6.5–8.1)

## 2022-10-23 LAB — SAMPLE TO BLOOD BANK

## 2022-10-23 LAB — MAGNESIUM: Magnesium: 1.2 mg/dL — ABNORMAL LOW (ref 1.7–2.4)

## 2022-10-23 MED ORDER — MAGNESIUM OXIDE -MG SUPPLEMENT 400 (240 MG) MG PO TABS: 400.0000 mg | ORAL_TABLET | Freq: Every day | ORAL | 0 refills | Status: AC

## 2022-10-23 MED ORDER — CHOLESTYRAMINE 4 G PO PACK: 4.0000 g | PACK | Freq: Three times a day (TID) | ORAL | 12 refills | Status: DC

## 2022-10-23 MED ORDER — ONDANSETRON HCL 4 MG/2ML IJ SOLN
4.0000 mg | Freq: Once | INTRAMUSCULAR | Status: AC
  Administered 2022-10-23: 4 mg via INTRAVENOUS
  Filled 2022-10-23: qty 2

## 2022-10-23 MED ORDER — HEPARIN SOD (PORK) LOCK FLUSH 100 UNIT/ML IV SOLN
500.0000 [IU] | Freq: Once | INTRAVENOUS | Status: AC
  Administered 2022-10-23: 500 [IU]

## 2022-10-23 MED ORDER — MAGNESIUM SULFATE 2 GM/50ML IV SOLN
2.0000 g | Freq: Once | INTRAVENOUS | Status: AC
  Administered 2022-10-23: 2 g via INTRAVENOUS
  Filled 2022-10-23: qty 50

## 2022-10-23 MED ORDER — SODIUM CHLORIDE 0.9 % IV SOLN: Freq: Once | INTRAVENOUS | Status: AC

## 2022-10-23 MED ORDER — SODIUM CHLORIDE 0.9% FLUSH
10.0000 mL | Freq: Once | INTRAVENOUS | Status: AC
  Administered 2022-10-23: 10 mL

## 2022-10-23 NOTE — Patient Instructions (Signed)

## 2022-10-23 NOTE — Patient Instructions (Addendum)
You should take the antidiarrhea medicines at least 2 hours apart.  Make sure you are taking them correctly so that they are able to work to the maximum effect.   A referral will be faxed to the wig store "[employer]" here in [place]. You can call the store to make sure they received it. If not, please reach out to Dr. Pamelia Hoit asking him to resend it.  That addresses [address], [place], [place] [address]  Phone number is [phone]

## 2022-10-23 NOTE — Progress Notes (Signed)
Symptom Management Consult Note [facility]    Patient Care Team: [name], MD as PCP - General (Hematology and Oncology) [name], MD as Consulting Physician (General Surgery) [name], MD as Consulting Physician (Hematology and Oncology) Antony Blackbird, MD as Consulting Physician (Radiation Oncology) Donnelly Angelica, RN as Oncology Nurse Navigator Pershing Proud, RN as Oncology Nurse Navigator    Name / MRN / DOB: Erika [name]  [id number]  [birth year]   Date of visit: 10/23/2022   Chief Complaint/Reason for visit: diarrhea   Current Therapy: Pertuzumab and trastuzumab-anns  Last treatment:  Day 1   Cycle 7 on 10/07/22   ASSESSMENT & PLAN: Patient is a 62 y.o. female  with oncologic history of malignant neoplasm of upper-outer quadrant of left breast in female, estrogen receptor followed by Dr. Pamelia Hoit.  I have viewed most recent oncology note and lab work.    #Malignant neoplasm of upper-outer quadrant of left breast in female, estrogen receptor  - Next appointment with oncologist is 10/29/22. - Patient expressed interest of getting a wig. Prescription faxed by oncologist for cranial prosthesis.  #Diarrhea -Grade 2. -Benign abdomen on exam. -Has some relief with Imodium and Lomotil. She plans to pick up Lomotil refill.  -Prescription sent to pharmacy for Questran as diarrhea not completely managed by Imodium and Lomotil.  Discussed with patient how to correctly take these medications. -Will hold off on stool studies as diarrhea has been ongoing during course of treatment and no recent antibiotic use. -Patient received 1 L normal saline in clinic for hydration support.  #Hypomagnesemia -Magnesium is low at 1.2, likely related to GI loss. -Patient received 2 g IV magnesium in clinic. -Prescription sent to pharmacy for 1 week p.o. magnesium replacement.  #Anemia -Patient recently required blood transfusion. HgB today is 9.4, no  indication for transfusion at this time. -Will continue to be monitored at upcoming appointments.  Strict ED precautions discussed should symptoms worsen.   Heme/Onc History: Oncology History  Malignant neoplasm of upper-outer quadrant of left breast in female, estrogen receptor negative (HCC)  05/15/2022 Initial Diagnosis   Palpable mass in the left breast at 1 o'clock position measured 4.2 cm ultrasound-guided biopsy revealed grade 3 IDC ER/PR negative HER2 positive Ki-67 35%, left axillary lymph node: Biopsy positive   05/21/2022 Cancer Staging   Staging form: Breast, AJCC 8th Edition - Clinical stage from 05/21/2022: Stage IIB (cT2, cN1, cM0, G3, ER-, PR-, HER2+) - Signed by [name], MD on 05/21/2022 Stage prefix: Initial diagnosis Histologic grading system: 3 grade system   05/29/2022 Genetic Testing   Negative [facility] Multi-Cancer +RNA Panel.  Report date is 05/29/2022.   The Multi-Cancer + RNA Panel offered by [facility] includes sequencing and/or deletion/duplication analysis of the following 70 genes:  AIP*, ALK, APC*, ATM*, AXIN2*, BAP1*, BARD1*, BLM*, BMPR1A*, BRCA1*, BRCA2*, BRIP1*, CDC73*, CDH1*, CDK4, CDKN1B*, CDKN2A, CHEK2*, CTNNA1*, DICER1*, EPCAM (del/dup only), EGFR, FH*, FLCN*, GREM1 (promoter dup only), HOXB13, KIT, LZTR1, MAX*, MBD4, MEN1*, MET, MITF, MLH1*, MSH2*, MSH3*, MSH6*, MUTYH*, NF1*, NF2*, NTHL1*, PALB2*, PDGFRA, PMS2*, POLD1*, POLE*, POT1*, PRKAR1A*, PTCH1*, PTEN*, RAD51C*, RAD51D*, RB1*, RET, SDHA* (sequencing only), SDHAF2*, SDHB*, SDHC*, SDHD*, SMAD4*, SMARCA4*, SMARCB1*, SMARCE1*, STK11*, SUFU*, TMEM127*, TP53*, TSC1*, TSC2*, VHL*. RNA analysis is performed for * genes.   06/05/2022 -  Chemotherapy   Patient is on Treatment Plan : BREAST  Docetaxel + Carboplatin + Trastuzumab + Pertuzumab  (TCHP) q21d          Interval  history-: [name] is a 62 y.o. female with oncologic history as above presenting to [facility] today with chief complaint of diarrhea.  She  presents unaccompanied to clinic.  Patient states his diarrhea has been ongoing during her treatment.  This episode of diarrhea has been ongoing x 9 days.  She has been taking Imodium and Lomotil at home with only mild improvement.  Her last doses were last night, no medications taken today prior to arrival.  She has not had any bowel movements today.  Patient is reporting 5-8 episodes of liquid stool on average.  She states her diarrhea is usually present x 4 days and then she will get have a day or 2 of relief before it restarts.  She denies any associated abdominal pain or fever.  She does have intermittent nausea which is controlled with Zofran and Compazine.  Patient denies any recent antibiotic use.  No one in the home has similar symptoms.  Patient feels she is hydrating well, drinking 64 ounces of fluid daily.    ROS  All other systems are reviewed and are negative for acute change except as noted in the HPI.    Allergies  Allergen Reactions   Penicillins Nausea Only     Past Medical History:  Diagnosis Date   Anxiety    Colitis 04/07/2008   seen on CT (at ER visit)   Depression    GERD (gastroesophageal reflux disease)    Hyperplastic colon polyp    Hypertension    Liver hemangioma 04/07/2008   seen on CT     Past Surgical History:  Procedure Laterality Date   "freezing of cervix"     ABDOMINAL HYSTERECTOMY     BREAST BIOPSY Left 05/15/2022   [place] LT BREAST BX W LOC DEV 1ST LESION IMG BX SPEC US GUIDE 05/15/2022 [facility] MAMMOGRAPHY   CESAREAN SECTION     NASAL SINUS SURGERY     PORTACATH PLACEMENT N/A 05/29/2022   Procedure: INSERTION PORT-A-CATH;  Surgeon: [name], MD;  Location: [facility];  Service: General;  Laterality: N/A;   right kneecap surgery +      Social History   Socioeconomic History   Marital status: Married    Spouse name: Not on file   Number of children: 0   Years of education: Not on file   Highest education level: Not on file  Occupational  History   Occupation: Real[employer]  Tobacco Use   Smoking status: Never   Smokeless tobacco: Never   Tobacco comments:    smoked 1 year while in college  Vaping Use   Vaping status: Never Used  Substance and Sexual Activity   Alcohol use: Yes    Alcohol/week: 14.0 standard drinks of alcohol    Types: 14 Glasses of wine per week    Comment: wine - daily   Drug use: No   Sexual activity: Not on file  Other Topics Concern   Not on file  Social History Narrative   1 caffeine drinks daily    Social Determinants of Health   Financial Resource Strain: Low Risk  (01/20/2022)   Received from [facility]   Overall Financial Resource Strain (CARDIA)    Difficulty of Paying Living Expenses: Not hard at all  Food Insecurity: No Food Insecurity (01/20/2022)   Received from [facility]   Hunger Vital Sign    Worried About Running Out of Food in the Last Year: Never true    Ran Out of Food in  the Last Year: Never true  Transportation Needs: Not on file  Physical Activity: Insufficiently Active (01/20/2022)   Received from [facility]   Exercise Vital Sign    Days of Exercise per Week: 2 days    Minutes of Exercise per Session: 20 min  Stress: Stress Concern Present (01/20/2022)   Received from [facility] - Occupational Stress Questionnaire    Feeling of Stress : To some extent  Social Connections: Socially Integrated (01/20/2022)   Received from [facility]   Social Network    How would you rate your social network (family, work, friends)?: Good participation with social networks  Intimate Partner Violence: Not At Risk (01/20/2022)   Received from [facility]   HITS    Over the last 12 months how often did your partner physically hurt you?: 1    Over the last 12 months how often did your partner insult you or talk down to you?: 1    Over the last 12 months how often did your partner threaten you with physical harm?: 1    Over  the last 12 months how often did your partner scream or curse at you?: 1    Family History  Problem Relation Age of Onset   Kidney cancer Mother        dx after 43; early stage   Hypertension Father    Heart disease Father    Colon polyps Father    Lung cancer Father 66       smoking hx   Colon polyps Sister    Hypertension Brother    Cancer Maternal Uncle        ? blood cancer; dx after 50   Cancer Maternal Grandmother        ? gallbladder ? colon; d. after 60   Diabetes Paternal Grandmother      Current Outpatient Medications:    cholestyramine (QUESTRAN) 4 g packet, Take 1 packet (4 g total) by mouth 3 (three) times daily with meals., Disp: 60 each, Rfl: 12   [START ON 10/24/2022] magnesium oxide (MAG-OX) 400 (240 Mg) MG tablet, Take 1 tablet (400 mg total) by mouth daily for 7 days., Disp: 7 tablet, Rfl: 0   ALPRAZolam (XANAX) 0.5 MG tablet, 1 TABLET ORALLY TWICE DAILY AS NEEDED 30 DAYS, Disp: , Rfl:    aspirin EC 81 MG tablet, Take 81 mg by mouth daily., Disp: , Rfl:    diphenoxylate-atropine (LOMOTIL) 2.5-0.025 MG tablet, TAKE 1 TABLET BY MOUTH 4 TIMES A DAY AS NEEDED FOR DIARRHEA OR LOOSE STOOLS, Disp: 30 tablet, Rfl: 3   lidocaine (XYLOCAINE) 2 % solution, SMARTSIG:By Mouth, Disp: , Rfl:    lidocaine-prilocaine (EMLA) cream, Apply to affected area once, Disp: 30 g, Rfl: 3   lisinopril (ZESTRIL) 20 MG tablet, Take 20 mg by mouth daily., Disp: , Rfl:    loratadine (CLARITIN) 10 MG tablet, Take 10 mg by mouth daily as needed for allergies., Disp: , Rfl:    LORazepam (ATIVAN) 0.5 MG tablet, Take 1 tablet (0.5 mg total) by mouth daily as needed for anxiety., Disp: 30 tablet, Rfl: 0   magic mouthwash (lidocaine, diphenhydrAMINE, alum & mag hydroxide) suspension, Swish and spit 5 mLs 4 (four) times daily as needed for mouth pain., Disp: 360 mL, Rfl: 0   omeprazole (PRILOSEC) 20 MG capsule, Take by mouth., Disp: , Rfl:    ondansetron (ZOFRAN) 8 MG tablet, Take 1 tablet (8 mg total)  by mouth every 8 (eight) hours as needed for nausea or vomiting. Start on the third day after chemotherapy., Disp: 30 tablet, Rfl: 1   oxyCODONE (OXY IR/ROXICODONE) 5 MG immediate release tablet, Take 1 tablet (5 mg total) by mouth every 6 (six) hours as needed for severe pain., Disp: 5 tablet, Rfl: 0   prochlorperazine (COMPAZINE) 10 MG tablet, Take 1 tablet (10 mg total) by mouth every 6 (six) hours as needed for nausea or vomiting., Disp: 30 tablet, Rfl: 1   rosuvastatin (CRESTOR) 20 MG tablet, Take 20 mg by mouth daily., Disp: , Rfl:    sertraline (ZOLOFT) 100 MG tablet, Take 100 mg by mouth daily., Disp: , Rfl:    triamcinolone ointment (KENALOG) 0.5 %, Apply 1 Application topically 2 (two) times daily., Disp: 30 g, Rfl: 2   valACYclovir (VALTREX) 1000 MG tablet, Take 1 tablet (1,000 mg total) by mouth 2 (two) times daily., Disp: 20 tablet, Rfl: 0 No current facility-administered medications for this visit.  Facility-Administered Medications Ordered in Other Visits:    heparin lock flush 100 unit/mL, 250 Units, Intracatheter, PRN, Pamelia Hoit, Vinay, MD   sodium chloride flush (NS) 0.9 % injection 3 mL, 3 mL, Intracatheter, PRN, [name], MD  PHYSICAL EXAM: ECOG FS:1 - Symptomatic but completely ambulatory    Vitals:   10/23/22 0929  BP: 122/76  Pulse: 69  Resp: 18  Temp: 98.5 F (36.9 C)  TempSrc: Oral  SpO2: 95%  Weight: 160 lb 8 oz (72.8 kg)   Physical Exam Vitals and nursing note reviewed.  Constitutional:      Appearance: She is not ill-appearing or toxic-appearing.  HENT:     Head: Normocephalic.     Mouth/Throat:     Mouth: Mucous membranes are moist.  Eyes:     Conjunctiva/sclera: Conjunctivae normal.  Cardiovascular:     Rate and Rhythm: Normal rate and regular rhythm.     Pulses: Normal pulses.     Heart sounds: Normal heart sounds.  Pulmonary:     Effort: Pulmonary effort is normal.     Breath sounds: Normal breath sounds.  Abdominal:     General: Bowel  sounds are normal. There is no distension.     Palpations: Abdomen is soft.     Tenderness: There is no abdominal tenderness.  Musculoskeletal:     Cervical back: Normal range of motion.  Skin:    General: Skin is warm and dry.  Neurological:     Mental Status: She is alert.        LABORATORY DATA: I have reviewed the data as listed    Latest Ref Rng & Units 10/23/2022    9:23 AM 10/07/2022   11:27 AM 09/18/2022    8:02 AM  CBC  WBC 4.0 - 10.5 K/uL 4.7  4.2  5.2   Hemoglobin 12.0 - 15.0 g/dL 9.4  7.0  9.7   Hematocrit 36.0 - 46.0 % 28.2  20.4  29.0   Platelets 150 - 400 K/uL 318  55  136         Latest Ref Rng & Units 10/23/2022    9:23 AM 10/07/2022   11:27 AM 09/18/2022    8:02 AM  CMP  Glucose 70 - 99 mg/dL 84  83  102   BUN 6 - 20 mg/dL 15  13  17    Creatinine 0.44 - 1.00 mg/dL 7.25  3.66  4.40   Sodium 135 - 145 mmol/L 143  140  142  Potassium 3.5 - 5.1 mmol/L 3.7  4.0  3.5   Chloride 98 - 111 mmol/L 108  107  107   CO2 22 - 32 mmol/L 27  27  26    Calcium 8.9 - 10.3 mg/dL 9.6  8.9  9.5   Total Protein 6.5 - 8.1 g/dL 6.8  6.1  6.9   Total Bilirubin 0.3 - 1.2 mg/dL 0.3  0.3  0.5   Alkaline Phos 38 - 126 U/L 91  98  98   AST 15 - 41 U/L 19  17  17    ALT 0 - 44 U/L 22  19  23         RADIOGRAPHIC STUDIES (from last 24 hours if applicable) I have personally reviewed the radiological images as listed and agreed with the findings in the report. No results found.      Visit Diagnosis: 1. Hypomagnesemia   2. Diarrhea, unspecified type   3. Malignant neoplasm of upper-outer quadrant of left breast in female, estrogen receptor negative (HCC)      No orders of the defined types were placed in this encounter.   All questions were answered. The patient knows to call the clinic with any problems, questions or concerns. No barriers to learning was detected.  A total of more than 30 minutes were spent on this encounter with face-to-face time and non-face-to-face  time, including preparing to see the patient, ordering tests and/or medications, counseling the patient and coordination of care as outlined above.    Thank you for allowing me to participate in the care of this patient.    Shanon Ace, PA-C Department of Hematology/Oncology [facility] at [facility] Phone: [phone]  [phone]    10/23/2022 1:50 PM

## 2022-10-23 NOTE — Telephone Encounter (Signed)
Patient requested RX for Wig. Dr. Pamelia Hoit gave verbal order Faxed signed RX for cranial prosthesis to [facility] Fax confirmation received   Information provided to patient via TC/VM and also sent MyChart message with information

## 2022-10-23 NOTE — Patient Instructions (Signed)

## 2022-10-28 ENCOUNTER — Encounter (HOSPITAL_BASED_OUTPATIENT_CLINIC_OR_DEPARTMENT_OTHER): Payer: Self-pay | Admitting: General Surgery

## 2022-10-29 ENCOUNTER — Inpatient Hospital Stay: Payer: 59

## 2022-10-29 ENCOUNTER — Inpatient Hospital Stay (HOSPITAL_BASED_OUTPATIENT_CLINIC_OR_DEPARTMENT_OTHER): Payer: 59 | Admitting: Hematology and Oncology

## 2022-10-29 VITALS — BP 120/73 | HR 78 | Temp 97.9°F | Resp 18 | Ht 68.0 in | Wt 159.3 lb

## 2022-10-29 VITALS — BP 122/69 | HR 64 | Resp 16

## 2022-10-29 DIAGNOSIS — Z171 Estrogen receptor negative status [ER-]: Secondary | ICD-10-CM

## 2022-10-29 DIAGNOSIS — C50412 Malignant neoplasm of upper-outer quadrant of left female breast: Secondary | ICD-10-CM | POA: Diagnosis not present

## 2022-10-29 DIAGNOSIS — Z95828 Presence of other vascular implants and grafts: Secondary | ICD-10-CM

## 2022-10-29 DIAGNOSIS — Z5112 Encounter for antineoplastic immunotherapy: Secondary | ICD-10-CM | POA: Diagnosis not present

## 2022-10-29 LAB — CBC WITH DIFFERENTIAL (CANCER CENTER ONLY)
Abs Immature Granulocytes: 0.01 10*3/uL (ref 0.00–0.07)
Basophils Absolute: 0 10*3/uL (ref 0.0–0.1)
Basophils Relative: 1 %
Eosinophils Absolute: 0.2 10*3/uL (ref 0.0–0.5)
Eosinophils Relative: 3 %
HCT: 28.9 % — ABNORMAL LOW (ref 36.0–46.0)
Hemoglobin: 9.7 g/dL — ABNORMAL LOW (ref 12.0–15.0)
Immature Granulocytes: 0 %
Lymphocytes Relative: 44 %
Lymphs Abs: 2.4 10*3/uL (ref 0.7–4.0)
MCH: 34.4 pg — ABNORMAL HIGH (ref 26.0–34.0)
MCHC: 33.6 g/dL (ref 30.0–36.0)
MCV: 102.5 fL — ABNORMAL HIGH (ref 80.0–100.0)
Monocytes Absolute: 0.5 10*3/uL (ref 0.1–1.0)
Monocytes Relative: 10 %
Neutro Abs: 2.3 10*3/uL (ref 1.7–7.7)
Neutrophils Relative %: 42 %
Platelet Count: 223 10*3/uL (ref 150–400)
RBC: 2.82 MIL/uL — ABNORMAL LOW (ref 3.87–5.11)
RDW: 19.8 % — ABNORMAL HIGH (ref 11.5–15.5)
WBC Count: 5.4 10*3/uL (ref 4.0–10.5)
nRBC: 0 % (ref 0.0–0.2)

## 2022-10-29 LAB — CMP (CANCER CENTER ONLY)
ALT: 25 U/L (ref 0–44)
AST: 24 U/L (ref 15–41)
Albumin: 4.2 g/dL (ref 3.5–5.0)
Alkaline Phosphatase: 87 U/L (ref 38–126)
Anion gap: 8 (ref 5–15)
BUN: 20 mg/dL (ref 6–20)
CO2: 26 mmol/L (ref 22–32)
Calcium: 9.2 mg/dL (ref 8.9–10.3)
Chloride: 108 mmol/L (ref 98–111)
Creatinine: 0.92 mg/dL (ref 0.44–1.00)
GFR, Estimated: >60 mL/min (ref >60)
Glucose, Bld: 105 mg/dL — ABNORMAL HIGH (ref 70–99)
Potassium: 3.7 mmol/L (ref 3.5–5.1)
Sodium: 142 mmol/L (ref 135–145)
Total Bilirubin: 0.3 mg/dL (ref 0.3–1.2)
Total Protein: 6.4 g/dL — ABNORMAL LOW (ref 6.5–8.1)

## 2022-10-29 MED ORDER — SODIUM CHLORIDE 0.9 % IV SOLN: Freq: Once | INTRAVENOUS | Status: AC

## 2022-10-29 MED ORDER — SODIUM CHLORIDE 0.9% FLUSH
10.0000 mL | INTRAVENOUS | Status: DC | PRN
  Administered 2022-10-29: 10 mL

## 2022-10-29 MED ORDER — ACETAMINOPHEN 325 MG PO TABS
650.0000 mg | ORAL_TABLET | Freq: Once | ORAL | Status: AC
  Administered 2022-10-29: 650 mg via ORAL
  Filled 2022-10-29: qty 2

## 2022-10-29 MED ORDER — LORAZEPAM 2 MG/ML IJ SOLN
0.5000 mg | Freq: Once | INTRAMUSCULAR | Status: AC
  Administered 2022-10-29: 0.5 mg via INTRAVENOUS
  Filled 2022-10-29: qty 1

## 2022-10-29 MED ORDER — SODIUM CHLORIDE 0.9 % IV SOLN
420.0000 mg | Freq: Once | INTRAVENOUS | Status: AC
  Administered 2022-10-29: 420 mg via INTRAVENOUS
  Filled 2022-10-29: qty 14

## 2022-10-29 MED ORDER — TRASTUZUMAB-ANNS CHEMO 150 MG IV SOLR
6.0000 mg/kg | Freq: Once | INTRAVENOUS | Status: AC
  Administered 2022-10-29: 420 mg via INTRAVENOUS
  Filled 2022-10-29: qty 20

## 2022-10-29 MED ORDER — SODIUM CHLORIDE 0.9% FLUSH
10.0000 mL | Freq: Once | INTRAVENOUS | Status: AC
  Administered 2022-10-29: 10 mL

## 2022-10-29 MED ORDER — HEPARIN SOD (PORK) LOCK FLUSH 100 UNIT/ML IV SOLN
500.0000 [IU] | Freq: Once | INTRAVENOUS | Status: AC | PRN
  Administered 2022-10-29: 500 [IU]

## 2022-10-29 NOTE — Progress Notes (Signed)
Patient Care Team: [name], MD as PCP - General (Hematology and Oncology) [name], MD as Consulting Physician (General Surgery) [name], MD as Consulting Physician (Hematology and Oncology) Antony Blackbird, MD as Consulting Physician (Radiation Oncology) Donnelly Angelica, RN as Oncology Nurse Navigator Pershing Proud, RN as Oncology Nurse Navigator  DIAGNOSIS:  Encounter Diagnosis  Name Primary?   Malignant neoplasm of upper-outer quadrant of left breast in female, estrogen receptor negative (HCC) Yes    SUMMARY OF ONCOLOGIC HISTORY: Oncology History  Malignant neoplasm of upper-outer quadrant of left breast in female, estrogen receptor negative (HCC)  05/15/2022 Initial Diagnosis   Palpable mass in the left breast at 1 o'clock position measured 4.2 cm ultrasound-guided biopsy revealed grade 3 IDC ER/PR negative HER2 positive Ki-67 35%, left axillary lymph node: Biopsy positive   05/21/2022 Cancer Staging   Staging form: Breast, AJCC 8th Edition - Clinical stage from 05/21/2022: Stage IIB (cT2, cN1, cM0, G3, ER-, PR-, HER2+) - Signed by [name], MD on 05/21/2022 Stage prefix: Initial diagnosis Histologic grading system: 3 grade system   05/29/2022 Genetic Testing   Negative [facility] Multi-Cancer +RNA Panel.  Report date is 05/29/2022.   The Multi-Cancer + RNA Panel offered by [facility] includes sequencing and/or deletion/duplication analysis of the following 70 genes:  AIP*, ALK, APC*, ATM*, AXIN2*, BAP1*, BARD1*, BLM*, BMPR1A*, BRCA1*, BRCA2*, BRIP1*, CDC73*, CDH1*, CDK4, CDKN1B*, CDKN2A, CHEK2*, CTNNA1*, DICER1*, EPCAM (del/dup only), EGFR, FH*, FLCN*, GREM1 (promoter dup only), HOXB13, KIT, LZTR1, MAX*, MBD4, MEN1*, MET, MITF, MLH1*, MSH2*, MSH3*, MSH6*, MUTYH*, NF1*, NF2*, NTHL1*, PALB2*, PDGFRA, PMS2*, POLD1*, POLE*, POT1*, PRKAR1A*, PTCH1*, PTEN*, RAD51C*, RAD51D*, RB1*, RET, SDHA* (sequencing only), SDHAF2*, SDHB*, SDHC*, SDHD*, SMAD4*, SMARCA4*, SMARCB1*,  SMARCE1*, STK11*, SUFU*, TMEM127*, TP53*, TSC1*, TSC2*, VHL*. RNA analysis is performed for * genes.   06/05/2022 -  Chemotherapy   Patient is on Treatment Plan : BREAST  Docetaxel + Carboplatin + Trastuzumab + Pertuzumab  (TCHP) q21d        CHIEF COMPLIANT:  Follow-up left breast cancer/Herceptin    INTERVAL HISTORY: LEXIANNA WEINRICH is a 62 y.o. female is here because of recent diagnosis of Left Breast cancer. She presents to the clinic for a follow-up. Pt reports that she is eating well. She does have diarrhea every few days. She is feeling better.   ALLERGIES:  is allergic to penicillins.  MEDICATIONS:  Current Outpatient Medications  Medication Sig Dispense Refill   ALPRAZolam (XANAX) 0.5 MG tablet 1 TABLET ORALLY TWICE DAILY AS NEEDED 30 DAYS     aspirin EC 81 MG tablet Take 81 mg by mouth daily.     cholestyramine (QUESTRAN) 4 g packet Take 1 packet (4 g total) by mouth 3 (three) times daily with meals. 60 each 12   diphenoxylate-atropine (LOMOTIL) 2.5-0.025 MG tablet TAKE 1 TABLET BY MOUTH 4 TIMES A DAY AS NEEDED FOR DIARRHEA OR LOOSE STOOLS 30 tablet 3   lidocaine (XYLOCAINE) 2 % solution SMARTSIG:By Mouth     lidocaine-prilocaine (EMLA) cream Apply to affected area once 30 g 3   lisinopril (ZESTRIL) 20 MG tablet Take 20 mg by mouth daily.     loratadine (CLARITIN) 10 MG tablet Take 10 mg by mouth daily as needed for allergies.     LORazepam (ATIVAN) 0.5 MG tablet Take 1 tablet (0.5 mg total) by mouth daily as needed for anxiety. 30 tablet 0   magic mouthwash (lidocaine, diphenhydrAMINE, alum & mag hydroxide) suspension Swish and spit 5 mLs 4 (four) times  daily as needed for mouth pain. 360 mL 0   magnesium oxide (MAG-OX) 400 (240 Mg) MG tablet Take 1 tablet (400 mg total) by mouth daily for 7 days. 7 tablet 0   omeprazole (PRILOSEC) 20 MG capsule Take by mouth.     ondansetron (ZOFRAN) 8 MG tablet Take 1 tablet (8 mg total) by mouth every 8 (eight) hours as needed for nausea or  vomiting. Start on the third day after chemotherapy. 30 tablet 1   oxyCODONE (OXY IR/ROXICODONE) 5 MG immediate release tablet Take 1 tablet (5 mg total) by mouth every 6 (six) hours as needed for severe pain. 5 tablet 0   prochlorperazine (COMPAZINE) 10 MG tablet Take 1 tablet (10 mg total) by mouth every 6 (six) hours as needed for nausea or vomiting. 30 tablet 1   rosuvastatin (CRESTOR) 20 MG tablet Take 20 mg by mouth daily.     sertraline (ZOLOFT) 100 MG tablet Take 100 mg by mouth daily.     triamcinolone ointment (KENALOG) 0.5 % Apply 1 Application topically 2 (two) times daily. 30 g 2   valACYclovir (VALTREX) 1000 MG tablet Take 1 tablet (1,000 mg total) by mouth 2 (two) times daily. 20 tablet 0   No current facility-administered medications for this visit.   Facility-Administered Medications Ordered in Other Visits  Medication Dose Route Frequency Provider Last Rate Last Admin   heparin lock flush 100 unit/mL  250 Units Intracatheter PRN [name], MD       pertuzumab (PERJETA) 420 mg in sodium chloride 0.9 % 250 mL chemo infusion  420 mg Intravenous Once Causey, Larna Daughters, NP       sodium chloride flush (NS) 0.9 % injection 3 mL  3 mL Intracatheter PRN [name], MD       trastuzumab-anns [facility]) 420 mg in sodium chloride 0.9 % 250 mL chemo infusion  6 mg/kg (Order-Specific) Intravenous Once Loa Socks, NP        PHYSICAL EXAMINATION: ECOG PERFORMANCE STATUS: 1 - Symptomatic but completely ambulatory  Vitals:   10/29/22 0845  BP: 120/73  Pulse: 78  Resp: 18  Temp: 97.9 F (36.6 C)  SpO2: 98%   Filed Weights   10/29/22 0845  Weight: 159 lb 4.8 oz (72.3 kg)     LABORATORY DATA:  I have reviewed the data as listed    Latest Ref Rng & Units 10/29/2022    8:24 AM 10/23/2022    9:23 AM 10/07/2022   11:27 AM  CMP  Glucose 70 - 99 mg/dL 161  84  83   BUN 6 - 20 mg/dL 20  15  13    Creatinine 0.44 - 1.00 mg/dL 0.96  0.45  4.09   Sodium 135 -  145 mmol/L 142  143  140   Potassium 3.5 - 5.1 mmol/L 3.7  3.7  4.0   Chloride 98 - 111 mmol/L 108  108  107   CO2 22 - 32 mmol/L 26  27  27    Calcium 8.9 - 10.3 mg/dL 9.2  9.6  8.9   Total Protein 6.5 - 8.1 g/dL 6.4  6.8  6.1   Total Bilirubin 0.3 - 1.2 mg/dL 0.3  0.3  0.3   Alkaline Phos 38 - 126 U/L 87  91  98   AST 15 - 41 U/L 24  19  17    ALT 0 - 44 U/L 25  22  19      Lab Results  Component Value Date  WBC 5.4 10/29/2022   HGB 9.7 (L) 10/29/2022   HCT 28.9 (L) 10/29/2022   MCV 102.5 (H) 10/29/2022   PLT 223 10/29/2022   NEUTROABS 2.3 10/29/2022    ASSESSMENT & PLAN:  Malignant neoplasm of upper-outer quadrant of left breast in female, 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   Treatment plan: 1. Neoadjuvant chemotherapy with TCH Perjeta 6 cycles completed 09/18/2022 (Taxotere switched with Abraxane) followed by Herceptin Perjeta maintenance versus Kadcyla maintenance (based on response to neoadjuvant chemo) for 1 year 2. Followed by breast conserving surgery if possible with sentinel lymph node study 3. Followed by adjuvant radiation therapy ------------------------------------------------------------------------ 09/24/2022: Breast MRI: Previously seen rim-enhancing mass UOQ left breast is now non-mass enhancement measuring 2.9 cm small enhancing nodules measuring 4 mm (biopsy 09/25/2022: Benign)  Surgery is planned for 11/04/2022. Plastic surgery has been set up for the week after.  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  Telephone visit in 2 weeks to discuss pathology report to determine the appropriate anti-HER2 therapy plan. She is hoping to take a short  vacation before radiation can start.    No orders of the defined types were placed in this encounter.  The patient has a good understanding of the overall plan. she agrees with it. she will call with any problems that may develop before the next visit here. Total time spent: 30 mins including face to face time and time spent for planning, charting and co-ordination of care   Tamsen Meek, MD 10/29/22    I Janan Ridge am acting as a Neurosurgeon for [employer]  I have reviewed the above documentation for accuracy and completeness, and I agree with the above.

## 2022-10-29 NOTE — Progress Notes (Signed)
Pt declined to stay for 30 min post Perjeta observation period. Tolerated well with no s/s adverse reaction. No hx adverse reaction. VSS at time of discharge. Pt ambulated independently to lobby for discharge.

## 2022-10-29 NOTE — Patient Instructions (Signed)
[facility]  Discharge Instructions: Thank you for choosing [facility] to provide your oncology and hematology care.   If you have a lab appointment with the Cancer Center, please go directly to the Cancer Center and check in at the registration area.   Wear comfortable clothing and clothing appropriate for easy access to any Portacath or PICC line.   We strive to give you quality time with your provider. You may need to reschedule your appointment if you arrive late (15 or more minutes).  Arriving late affects you and other patients whose appointments are after yours.  Also, if you miss three or more appointments without notifying the office, you may be dismissed from the clinic at the provider's discretion.      For prescription refill requests, have your pharmacy contact our office and allow 72 hours for refills to be completed.    Today you received the following chemotherapy and/or immunotherapy agents Kanjinti, Perjeta.      To help prevent nausea and vomiting after your treatment, we encourage you to take your nausea medication as directed.  BELOW ARE SYMPTOMS THAT SHOULD BE REPORTED IMMEDIATELY: *FEVER GREATER THAN 100.4 F (38 C) OR HIGHER *CHILLS OR SWEATING *NAUSEA AND VOMITING THAT IS NOT CONTROLLED WITH YOUR NAUSEA MEDICATION *UNUSUAL SHORTNESS OF BREATH *UNUSUAL BRUISING OR BLEEDING *URINARY PROBLEMS (pain or burning when urinating, or frequent urination) *BOWEL PROBLEMS (unusual diarrhea, constipation, pain near the anus) TENDERNESS IN MOUTH AND THROAT WITH OR WITHOUT PRESENCE OF ULCERS (sore throat, sores in mouth, or a toothache) UNUSUAL RASH, SWELLING OR PAIN  UNUSUAL VAGINAL DISCHARGE OR ITCHING   Items with * indicate a potential emergency and should be followed up as soon as possible or go to the Emergency Department if any problems should occur.  Please show the CHEMOTHERAPY ALERT CARD or IMMUNOTHERAPY ALERT CARD  at check-in to the Emergency Department and triage nurse.  Should you have questions after your visit or need to cancel or reschedule your appointment, please contact [facility]  Dept: [phone]  and follow the prompts.  Office hours are 8:00 a.m. to 4:30 p.m. Monday - Friday. Please note that voicemails left after 4:00 p.m. may not be returned until the following business day.  We are closed weekends and major holidays. You have access to a nurse at all times for urgent questions. Please call the main number to the clinic Dept: [phone] and follow the prompts.   For any non-urgent questions, you may also contact your provider using MyChart. We now offer e-Visits for anyone 18 and older to request care online for non-urgent symptoms. For details visit [email or web address].   Also download the MyChart app! Go to the app store, search "MyChart", open the app, select [facility], and log in with your MyChart username and password.   

## 2022-10-29 NOTE — Assessment & Plan Note (Signed)
stage IIB ER/PR negative, HER2 positive breast cancer here today for evaluation and follow-up prior to receiving cycle 6 of neoadjuvant Abraxane, carboplatin, Herceptin, and Perjeta.   Treatment plan: 1. Neoadjuvant chemotherapy with TCH Perjeta 6 cycles completed 09/18/2022 (Taxotere switched with Abraxane) followed by Herceptin Perjeta maintenance versus Kadcyla maintenance (based on response to neoadjuvant chemo) for 1 year 2. Followed by breast conserving surgery if possible with sentinel lymph node study 3. Followed by adjuvant radiation therapy ------------------------------------------------------------------------ 09/24/2022: Breast MRI: Previously seen rim-enhancing mass UOQ left breast is now non-mass enhancement measuring 2.9 cm small enhancing nodules measuring 4 mm (biopsy 09/25/2022: Benign)  Surgery is planned for 11/04/2022. Plastic surgery has been set up for the week after.  Current treatment: Herceptin Perjeta maintenance Severe anemia: Previously received blood transfusion  Return to clinic in 2 weeks to discuss pathology report.

## 2022-10-30 ENCOUNTER — Telehealth: Payer: Self-pay | Admitting: Hematology and Oncology

## 2022-10-30 NOTE — Telephone Encounter (Signed)
Scheduled appointment per 7/24 los. Patient is aware of the made appointment.

## 2022-10-31 ENCOUNTER — Ambulatory Visit: Payer: 59

## 2022-11-03 ENCOUNTER — Ambulatory Visit: Admission: RE | Admit: 2022-11-03 | Payer: 59 | Source: Ambulatory Visit

## 2022-11-03 ENCOUNTER — Other Ambulatory Visit: Payer: Self-pay | Admitting: General Surgery

## 2022-11-03 ENCOUNTER — Ambulatory Visit
Admission: RE | Admit: 2022-11-03 | Discharge: 2022-11-03 | Disposition: A | Discharge status: Home / Self Care | Payer: 59 | Source: Ambulatory Visit | Attending: General Surgery | Admitting: General Surgery

## 2022-11-03 DIAGNOSIS — C50412 Malignant neoplasm of upper-outer quadrant of left female breast: Secondary | ICD-10-CM

## 2022-11-03 HISTORY — PX: BREAST BIOPSY: SHX20

## 2022-11-03 MED ORDER — CHLORHEXIDINE GLUCONATE CLOTH 2 % EX PADS: 6.0000 | MEDICATED_PAD | Freq: Once | CUTANEOUS | Status: DC

## 2022-11-03 NOTE — Progress Notes (Signed)

## 2022-11-04 ENCOUNTER — Ambulatory Visit (HOSPITAL_BASED_OUTPATIENT_CLINIC_OR_DEPARTMENT_OTHER): Payer: 59 | Admitting: Anesthesiology

## 2022-11-04 ENCOUNTER — Encounter (HOSPITAL_BASED_OUTPATIENT_CLINIC_OR_DEPARTMENT_OTHER)
Admission: RE | Disposition: A | Discharge status: Home / Self Care | Payer: Self-pay | Source: Home / Self Care | Attending: General Surgery

## 2022-11-04 ENCOUNTER — Ambulatory Visit (HOSPITAL_COMMUNITY)
Admission: RE | Admit: 2022-11-04 | Discharge: 2022-11-04 | Disposition: A | Discharge status: Home / Self Care | Payer: 59 | Source: Ambulatory Visit | Attending: General Surgery | Admitting: General Surgery

## 2022-11-04 ENCOUNTER — Other Ambulatory Visit: Payer: Self-pay

## 2022-11-04 ENCOUNTER — Encounter (HOSPITAL_BASED_OUTPATIENT_CLINIC_OR_DEPARTMENT_OTHER): Payer: Self-pay | Admitting: General Surgery

## 2022-11-04 ENCOUNTER — Ambulatory Visit
Admission: RE | Admit: 2022-11-04 | Discharge: 2022-11-04 | Disposition: A | Discharge status: Home / Self Care | Payer: 59 | Source: Ambulatory Visit | Attending: General Surgery | Admitting: General Surgery

## 2022-11-04 ENCOUNTER — Ambulatory Visit (HOSPITAL_BASED_OUTPATIENT_CLINIC_OR_DEPARTMENT_OTHER)
Admission: RE | Admit: 2022-11-04 | Discharge: 2022-11-04 | Disposition: A | Discharge status: Home / Self Care | Payer: 59 | Attending: General Surgery | Admitting: General Surgery

## 2022-11-04 DIAGNOSIS — C50912 Malignant neoplasm of unspecified site of left female breast: Secondary | ICD-10-CM

## 2022-11-04 DIAGNOSIS — C50412 Malignant neoplasm of upper-outer quadrant of left female breast: Secondary | ICD-10-CM

## 2022-11-04 DIAGNOSIS — K219 Gastro-esophageal reflux disease without esophagitis: Secondary | ICD-10-CM | POA: Insufficient documentation

## 2022-11-04 DIAGNOSIS — D0512 Intraductal carcinoma in situ of left breast: Secondary | ICD-10-CM | POA: Diagnosis not present

## 2022-11-04 DIAGNOSIS — I10 Essential (primary) hypertension: Secondary | ICD-10-CM | POA: Insufficient documentation

## 2022-11-04 DIAGNOSIS — Z87891 Personal history of nicotine dependence: Secondary | ICD-10-CM | POA: Diagnosis not present

## 2022-11-04 DIAGNOSIS — Z171 Estrogen receptor negative status [ER-]: Secondary | ICD-10-CM | POA: Diagnosis not present

## 2022-11-04 DIAGNOSIS — Z9221 Personal history of antineoplastic chemotherapy: Secondary | ICD-10-CM | POA: Insufficient documentation

## 2022-11-04 HISTORY — PX: RADIOACTIVE SEED GUIDED AXILLARY SENTINEL LYMPH NODE: SHX6735

## 2022-11-04 HISTORY — PX: BREAST LUMPECTOMY: SHX2

## 2022-11-04 HISTORY — PX: BREAST LUMPECTOMY WITH RADIOACTIVE SEED AND SENTINEL LYMPH NODE BIOPSY: SHX6550

## 2022-11-04 SURGERY — BREAST LUMPECTOMY WITH RADIOACTIVE SEED AND SENTINEL LYMPH NODE BIOPSY
Anesthesia: Regional | Site: Breast | Laterality: Left

## 2022-11-04 MED ORDER — ACETAMINOPHEN 325 MG PO TABS: 650.0000 mg | ORAL_TABLET | Freq: Once | ORAL | Status: DC

## 2022-11-04 MED ORDER — OXYCODONE HCL 5 MG PO TABS: 5.0000 mg | ORAL_TABLET | Freq: Four times a day (QID) | ORAL | 0 refills | Status: DC | PRN

## 2022-11-04 MED ORDER — MIDAZOLAM HCL 2 MG/2ML IJ SOLN
2.0000 mg | Freq: Once | INTRAMUSCULAR | Status: AC
  Administered 2022-11-04: 2 mg via INTRAVENOUS

## 2022-11-04 MED ORDER — OXYCODONE HCL 5 MG/5ML PO SOLN: 5.0000 mg | Freq: Once | ORAL | Status: DC | PRN

## 2022-11-04 MED ORDER — MIDAZOLAM HCL 2 MG/2ML IJ SOLN
INTRAMUSCULAR | Status: AC
  Filled 2022-11-04: qty 2

## 2022-11-04 MED ORDER — DEXAMETHASONE SODIUM PHOSPHATE 10 MG/ML IJ SOLN
INTRAMUSCULAR | Status: DC | PRN
  Administered 2022-11-04: 4 mg via INTRAVENOUS

## 2022-11-04 MED ORDER — CIPROFLOXACIN IN D5W 400 MG/200ML IV SOLN
INTRAVENOUS | Status: AC
  Filled 2022-11-04: qty 200

## 2022-11-04 MED ORDER — CIPROFLOXACIN IN D5W 400 MG/200ML IV SOLN
400.0000 mg | INTRAVENOUS | Status: AC
  Administered 2022-11-04: 400 mg via INTRAVENOUS

## 2022-11-04 MED ORDER — LIDOCAINE 2% (20 MG/ML) 5 ML SYRINGE
INTRAMUSCULAR | Status: DC | PRN
  Administered 2022-11-04: 20 mg via INTRAVENOUS

## 2022-11-04 MED ORDER — ACETAMINOPHEN 160 MG/5ML PO SOLN: 325.0000 mg | ORAL | Status: DC | PRN

## 2022-11-04 MED ORDER — PROPOFOL 10 MG/ML IV BOLUS
INTRAVENOUS | Status: DC | PRN
  Administered 2022-11-04: 160 mg via INTRAVENOUS

## 2022-11-04 MED ORDER — LIDOCAINE-EPINEPHRINE (PF) 1 %-1:200000 IJ SOLN
INTRAMUSCULAR | Status: DC | PRN
  Administered 2022-11-04: 30 mL

## 2022-11-04 MED ORDER — FENTANYL CITRATE (PF) 100 MCG/2ML IJ SOLN
INTRAMUSCULAR | Status: AC
  Filled 2022-11-04: qty 2

## 2022-11-04 MED ORDER — ONDANSETRON HCL 4 MG/2ML IJ SOLN: 4.0000 mg | Freq: Once | INTRAMUSCULAR | Status: DC | PRN

## 2022-11-04 MED ORDER — LACTATED RINGERS IV SOLN: INTRAVENOUS | Status: DC

## 2022-11-04 MED ORDER — OXYCODONE HCL 5 MG PO TABS: 5.0000 mg | ORAL_TABLET | Freq: Once | ORAL | Status: DC | PRN

## 2022-11-04 MED ORDER — MAGTRACE LYMPHATIC TRACER
INTRAMUSCULAR | Status: DC | PRN
  Administered 2022-11-04: 2 mL via INTRAMUSCULAR

## 2022-11-04 MED ORDER — TECHNETIUM TC 99M TILMANOCEPT KIT
1.0000 | PACK | Freq: Once | INTRAVENOUS | Status: AC | PRN
  Administered 2022-11-04: 1 via INTRADERMAL

## 2022-11-04 MED ORDER — PROPOFOL 500 MG/50ML IV EMUL
INTRAVENOUS | Status: DC | PRN
  Administered 2022-11-04: 200 ug/kg/min via INTRAVENOUS

## 2022-11-04 MED ORDER — FAMOTIDINE IN NACL 20-0.9 MG/50ML-% IV SOLN: 20.0000 mg | Freq: Once | INTRAVENOUS | Status: DC

## 2022-11-04 MED ORDER — MIDAZOLAM HCL 5 MG/5ML IJ SOLN
INTRAMUSCULAR | Status: DC | PRN
  Administered 2022-11-04: 1 mg via INTRAVENOUS

## 2022-11-04 MED ORDER — MEPERIDINE HCL 25 MG/ML IJ SOLN: 6.2500 mg | INTRAMUSCULAR | Status: DC | PRN

## 2022-11-04 MED ORDER — BUPIVACAINE HCL (PF) 0.25 % IJ SOLN
INTRAMUSCULAR | Status: DC | PRN
  Administered 2022-11-04: 30 mL via PERINEURAL

## 2022-11-04 MED ORDER — FENTANYL CITRATE (PF) 100 MCG/2ML IJ SOLN
100.0000 ug | Freq: Once | INTRAMUSCULAR | Status: AC
  Administered 2022-11-04: 100 ug via INTRAVENOUS

## 2022-11-04 MED ORDER — 0.9 % SODIUM CHLORIDE (POUR BTL) OPTIME
TOPICAL | Status: DC | PRN
Start: 2022-11-04 — End: 2022-11-04
  Administered 2022-11-04: 1000 mL

## 2022-11-04 MED ORDER — FENTANYL CITRATE (PF) 250 MCG/5ML IJ SOLN
INTRAMUSCULAR | Status: DC | PRN
  Administered 2022-11-04 (×6): 25 ug via INTRAVENOUS

## 2022-11-04 MED ORDER — ACETAMINOPHEN 325 MG PO TABS: 325.0000 mg | ORAL_TABLET | ORAL | Status: DC | PRN

## 2022-11-04 MED ORDER — ACETAMINOPHEN 500 MG PO TABS
1000.0000 mg | ORAL_TABLET | ORAL | Status: AC
  Administered 2022-11-04: 1000 mg via ORAL

## 2022-11-04 MED ORDER — FENTANYL CITRATE (PF) 100 MCG/2ML IJ SOLN: 25.0000 ug | INTRAMUSCULAR | Status: DC | PRN

## 2022-11-04 MED ORDER — ACETAMINOPHEN 500 MG PO TABS
ORAL_TABLET | ORAL | Status: AC
  Filled 2022-11-04: qty 2

## 2022-11-04 MED ORDER — ONDANSETRON HCL 4 MG/2ML IJ SOLN
INTRAMUSCULAR | Status: DC | PRN
  Administered 2022-11-04: 4 mg via INTRAVENOUS

## 2022-11-04 SURGICAL SUPPLY — 65 items
ADH SKN CLS APL DERMABOND .7 (GAUZE/BANDAGES/DRESSINGS) ×2
APL PRP STRL LF DISP 70% ISPRP (MISCELLANEOUS) ×2
BINDER BREAST LRG (GAUZE/BANDAGES/DRESSINGS) IMPLANT
BINDER BREAST MEDIUM (GAUZE/BANDAGES/DRESSINGS) IMPLANT
BINDER BREAST XLRG (GAUZE/BANDAGES/DRESSINGS) IMPLANT
BINDER BREAST XXLRG (GAUZE/BANDAGES/DRESSINGS) IMPLANT
BLADE HEX COATED 2.75 (ELECTRODE) ×3 IMPLANT
BLADE SURG 10 STRL SS (BLADE) ×3 IMPLANT
BLADE SURG 15 STRL LF DISP TIS (BLADE) ×3 IMPLANT
BLADE SURG 15 STRL SS (BLADE)
BNDG CMPR 5X4 CHSV STRCH STRL (GAUZE/BANDAGES/DRESSINGS) ×2
BNDG COHESIVE 4X5 TAN STRL LF (GAUZE/BANDAGES/DRESSINGS) ×3 IMPLANT
CANISTER SUC SOCK COL 7IN (MISCELLANEOUS) IMPLANT
CANISTER SUCT 1200ML W/VALVE (MISCELLANEOUS) ×3 IMPLANT
CHLORAPREP W/TINT 26 (MISCELLANEOUS) ×3 IMPLANT
CLIP TI LARGE 6 (CLIP) ×3 IMPLANT
CLIP TI MEDIUM 6 (CLIP) ×6 IMPLANT
CLIP TI WIDE RED SMALL 6 (CLIP) IMPLANT
COVER MAYO STAND STRL (DRAPES) ×6 IMPLANT
COVER PROBE CYLINDRICAL 5X96 (MISCELLANEOUS) ×3 IMPLANT
DERMABOND ADVANCED .7 DNX12 (GAUZE/BANDAGES/DRESSINGS) ×3 IMPLANT
DRAPE UTILITY XL STRL (DRAPES) ×3 IMPLANT
ELECT COATED BLADE 2.86 ST (ELECTRODE) ×3 IMPLANT
ELECT REM PT RETURN 9FT ADLT (ELECTROSURGICAL) ×2
ELECTRODE REM PT RTRN 9FT ADLT (ELECTROSURGICAL) ×3 IMPLANT
GAUZE PAD ABD 8X10 STRL (GAUZE/BANDAGES/DRESSINGS) ×3 IMPLANT
GAUZE SPONGE 4X4 12PLY STRL LF (GAUZE/BANDAGES/DRESSINGS) ×3 IMPLANT
GLOVE BIO SURGEON STRL SZ 6 (GLOVE) ×3 IMPLANT
GLOVE BIOGEL PI IND STRL 6.5 (GLOVE) ×3 IMPLANT
GOWN STRL REUS W/ TWL LRG LVL3 (GOWN DISPOSABLE) ×3 IMPLANT
GOWN STRL REUS W/ TWL XL LVL3 (GOWN DISPOSABLE) ×3 IMPLANT
GOWN STRL REUS W/TWL LRG LVL3 (GOWN DISPOSABLE) ×2
GOWN STRL REUS W/TWL XL LVL3 (GOWN DISPOSABLE) ×2
KIT MARKER MARGIN INK (KITS) ×3 IMPLANT
LIGHT WAVEGUIDE WIDE FLAT (MISCELLANEOUS) IMPLANT
NDL HYPO 25X1 1.5 SAFETY (NEEDLE) ×3 IMPLANT
NDL SAFETY ECLIP 18X1.5 (MISCELLANEOUS) ×3 IMPLANT
NEEDLE HYPO 25X1 1.5 SAFETY (NEEDLE) ×4 IMPLANT
NS IRRIG 1000ML POUR BTL (IV SOLUTION) ×3 IMPLANT
PACK BASIN DAY SURGERY FS (CUSTOM PROCEDURE TRAY) ×3 IMPLANT
PACK UNIVERSAL I (CUSTOM PROCEDURE TRAY) ×3 IMPLANT
PENCIL SMOKE EVACUATOR (MISCELLANEOUS) ×3 IMPLANT
SLEEVE SCD COMPRESS KNEE MED (STOCKING) ×3 IMPLANT
SPIKE FLUID TRANSFER (MISCELLANEOUS) IMPLANT
SPONGE T-LAP 18X18 ~~LOC~~+RFID (SPONGE) ×6 IMPLANT
STAPLER VISISTAT 35W (STAPLE) IMPLANT
STOCKINETTE IMPERVIOUS LG (DRAPES) ×3 IMPLANT
STRIP CLOSURE SKIN 1/2X4 (GAUZE/BANDAGES/DRESSINGS) ×3 IMPLANT
SUT ETHILON 2 0 FS 18 (SUTURE) IMPLANT
SUT MNCRL AB 4-0 PS2 18 (SUTURE) ×3 IMPLANT
SUT MON AB 5-0 PS2 18 (SUTURE) IMPLANT
SUT SILK 2 0 SH (SUTURE) IMPLANT
SUT VIC AB 2-0 SH 27 (SUTURE) ×2
SUT VIC AB 2-0 SH 27XBRD (SUTURE) ×3 IMPLANT
SUT VIC AB 3-0 SH 27 (SUTURE)
SUT VIC AB 3-0 SH 27X BRD (SUTURE) ×3 IMPLANT
SUT VIC AB 5-0 PS2 18 (SUTURE) IMPLANT
SUT VICRYL 3-0 CR8 SH (SUTURE) ×3 IMPLANT
SYR BULB EAR ULCER 3OZ GRN STR (SYRINGE) ×3 IMPLANT
SYR CONTROL 10ML LL (SYRINGE) ×3 IMPLANT
TOWEL GREEN STERILE FF (TOWEL DISPOSABLE) ×3 IMPLANT
TRACER MAGTRACE VIAL (MISCELLANEOUS) IMPLANT
TRAY FAXITRON CT DISP (TRAY / TRAY PROCEDURE) ×3 IMPLANT
TUBE CONNECTING 20X1/4 (TUBING) ×3 IMPLANT
YANKAUER SUCT BULB TIP NO VENT (SUCTIONS) ×3 IMPLANT

## 2022-11-04 NOTE — Op Note (Signed)
Left Breast Radioactive seed bracketed lumpectomy, seed targeted left axillary lymph node biopsy and sentinel lymph node biopsy  Indications: This patient presents with history of left breast cancer, cT2N1 grade 3 invasive ductal carcinoma, upper outer quadrant, ER-/PR-/Her2+, s/p neoadjuvant chemo  Pre-operative Diagnosis: left breast cancer  Post-operative Diagnosis: Same  Surgeon: [name]   Assistant: n/a  Anesthesia: General endotracheal anesthesia  ASA Class: 2  Procedure Details  The patient was seen in the Holding Room. The risks, benefits, complications, treatment options, and expected outcomes were discussed with the patient. The possibilities of bleeding, infection, the need for additional procedures, failure to diagnose a condition, and creating a complication requiring transfusion or operation were discussed with the patient. The patient concurred with the proposed plan, giving informed consent.  The site of surgery properly noted/marked. The patient was taken to Operating Room # 8, identified, and the procedure verified as left Breast Seed bracketed Lumpectomy with seed targeted axillary lymph node biopsy and sentinel lymph node biopsy. The left arm, breast, and chest were prepped and draped in standard fashion. A Time Out was held and the above information confirmed. The MagTrace was injected into the subareolar position.  The lumpectomy was performed by creating an incision near the previously placed radioactive seeds within the region marked by Dr. Leta Baptist.  Dissection was carried down to around the point of maximum signal intensity. The cautery was used to perform the dissection.  Hemostasis was achieved with cautery. The edges of the cavity were marked with large clips.   The specimen was inked with the margin marker paint kit.    Specimen radiography confirmed inclusion of the mammographic lesion, the clips, and the seeds.  The background signal in the breast was zero.   3D specimen evaluation showed a reasonable margin around the markers.  The wound was irrigated and reinspected for hemostasis.  The breast tissue was not rearranged in order to avoid potential devascularization of tissue for Dr. Leta Baptist The skin was then closed with 3-0 vicryl in layers and 4-0 monocryl subcuticular suture.    Using a neoprobe probe, left axillary sentinel nodes were identified transcutaneously.  An oblique incision was created below the axillary hairline.  Dissection was carried through the clavipectoral fascia. One deep level 2 node was taken with the seed and clip.  Additional 4 level two deep axillary sentinel nodes were removed.  Counts per second were at highest 500.    The background count was 30 cps.  The wound was irrigated.  Hemostasis was achieved with cautery.  The axillary incision was closed with a 3-0 vicryl deep dermal interrupted sutures and a 4-0 monocryl subcuticular closure.    Sterile dressings were applied. At the end of the operation, all sponge, instrument, and needle counts were correct.  Findings: grossly clear surgical margins and no adenopathy, posterior margin was pectoralis, anterior margin is skin  Estimated Blood Loss:  min         Specimens: left breast tissue with seeds and five left axillary sentinel lymph nodes.             Complications:  None; patient tolerated the procedure well.         Disposition: PACU - hemodynamically stable.         Condition: stable

## 2022-11-04 NOTE — Anesthesia Procedure Notes (Signed)
Anesthesia Regional Block: Pectoralis block   Pre-Anesthetic Checklist: , timeout performed,  Correct Patient, Correct Site, Correct Laterality,  Correct Procedure, Correct Position, site marked,  Risks and benefits discussed,  Surgical consent,  Pre-op evaluation,  At surgeon's request and post-op pain management  Laterality: Left  Prep: chloraprep       Needles:  Injection technique: Single-shot  Needle Type: Echogenic Stimulator Needle     Needle Length: 5cm  Needle Gauge: 22     Additional Needles:   Procedures:,,,, ultrasound used (permanent image in chart),,    Narrative:  Start time: 11/04/2022 12:45 PM End time: 11/04/2022 12:48 PM Injection made incrementally with aspirations every 5 mL.  Performed by: Personally  Anesthesiologist: Bethena Midget, MD  Additional Notes: Functioning IV was confirmed and monitors were applied.  A 50mm 22ga Arrow echogenic stimulator needle was used. Sterile prep and drape,hand hygiene and sterile gloves were used. Ultrasound guidance: relevant anatomy identified, needle position confirmed, local anesthetic spread visualized around nerve(s)., vascular puncture avoided.  Image printed for medical record. Negative aspiration and negative test dose prior to incremental administration of local anesthetic. The patient tolerated the procedure well.

## 2022-11-04 NOTE — Discharge Instructions (Addendum)
[facility] Office Phone Number [phone]    BREAST BIOPSY/ PARTIAL MASTECTOMY: POST OP INSTRUCTIONS  Always review your discharge instruction sheet given to you by the facility where your surgery was performed.  IF YOU HAVE DISABILITY OR FAMILY LEAVE FORMS, YOU MUST BRING THEM TO THE OFFICE FOR PROCESSING.  DO NOT GIVE THEM TO YOUR DOCTOR.  Take 2 tylenol (acetominophen) three times a day for 3 days.  If you still have pain, add ibuprofen with food in between if able to take this (if you have kidney issues or stomach issues, do not take ibuprofen).  If both of those are not enough, add the narcotic pain pill.  If you find you are needing a lot of this overnight after surgery, call the next morning for a refill.    Prescriptions will not be filled after 5pm or on week-ends. Take your usually prescribed medications unless otherwise directed You should eat very light the first 24 hours after surgery, such as soup, crackers, pudding, etc.  Resume your normal diet the day after surgery. Most patients will experience some swelling and bruising in the breast.  Ice packs and a good support bra will help.  Swelling and bruising can take several days to resolve.  It is common to experience some constipation if taking pain medication after surgery.  Increasing fluid intake and taking a stool softener will usually help or prevent this problem from occurring.  A mild laxative (Milk of Magnesia or Miralax) should be taken according to package directions if there are no bowel movements after 48 hours. Unless discharge instructions indicate otherwise, you may remove your bandages 48 hours after surgery, and you may shower at that time.  You may have steri-strips (small skin tapes) in place directly over the incision.  These strips should be left on the skin at least for for 7-10 days.    ACTIVITIES:  You may resume regular daily activities (gradually increasing) beginning the next day.  Wearing  a good support bra or sports bra (or the breast binder) minimizes pain and swelling.  You may have sexual intercourse when it is comfortable. No heavy lifting for 1-2 weeks (not over around 10 pounds).  You may drive when you no longer are taking prescription pain medication, you can comfortably wear a seatbelt, and you can safely maneuver your car and apply brakes. RETURN TO WORK:  __________3-14 days depending on job. _______________ Erika [name] should see your doctor in the office for a follow-up appointment approximately two weeks after your surgery.  Your doctor's nurse will typically make your follow-up appointment when she calls you with your pathology report.  Expect your pathology report 3-4 business days after your surgery.  You may call to check if you do not hear from [place] after three days.   WHEN TO CALL YOUR DOCTOR: Fever over 101.0 Nausea and/or vomiting. Extreme swelling or bruising. Continued bleeding from incision. Increased pain, redness, or drainage from the incision.  The clinic staff is available to answer your questions during regular business hours.  Please don't hesitate to call and ask to speak to one of the nurses for clinical concerns.  If you have a medical emergency, go to the nearest emergency room or call 911.  A surgeon from [facility] is always on call at the hospital.  For further questions, please visit [email or web address]   No Tylenol until after 6:15pm today, if needed.  Post Anesthesia Home Care Instructions  Activity: Get plenty of rest for  the remainder of the day. A responsible individual must stay with you for 24 hours following the procedure.  For the next 24 hours, DO NOT: -Drive a car -Advertising copywriter -Drink alcoholic beverages -Take any medication unless instructed by your physician -Make any legal decisions or sign important papers.  Meals: Start with liquid foods such as gelatin or soup. Progress to regular foods as  tolerated. Avoid greasy, spicy, heavy foods. If nausea and/or vomiting occur, drink only clear liquids until the nausea and/or vomiting subsides. Call your physician if vomiting continues.  Special Instructions/Symptoms: Your throat may feel dry or sore from the anesthesia or the breathing tube placed in your throat during surgery. If this causes discomfort, gargle with warm salt water. The discomfort should disappear within 24 hours.  If you had a scopolamine patch placed behind your ear for the management of post- operative nausea and/or vomiting:  1. The medication in the patch is effective for 72 hours, after which it should be removed.  Wrap patch in a tissue and discard in the trash. Wash hands thoroughly with soap and water. 2. You may remove the patch earlier than 72 hours if you experience unpleasant side effects which may include dry mouth, dizziness or visual disturbances. 3. Avoid touching the patch. Wash your hands with soap and water after contact with the patch.

## 2022-11-04 NOTE — Anesthesia Procedure Notes (Signed)
Procedure Name: LMA Insertion Date/Time: 11/04/2022 2:14 PM  Performed by: Demetrio Lapping, CRNAPre-anesthesia Checklist: Patient identified, Emergency Drugs available, Suction available and Patient being monitored Patient Re-evaluated:Patient Re-evaluated prior to induction Oxygen Delivery Method: Circle System Utilized Preoxygenation: Pre-oxygenation with 100% oxygen Induction Type: IV induction Ventilation: Mask ventilation without difficulty LMA: LMA inserted LMA Size: 4.0 Number of attempts: 1 Airway Equipment and Method: Bite block Placement Confirmation: positive ETCO2 Tube secured with: Tape Dental Injury: Teeth and Oropharynx as per pre-operative assessment

## 2022-11-04 NOTE — Progress Notes (Signed)
Assisted Dr. Ambrose Pancoast with left, pectoralis, ultrasound guided block. Side rails up, monitors on throughout procedure. See vital signs in flow sheet. Tolerated Procedure well.

## 2022-11-04 NOTE — Transfer of Care (Signed)
Immediate Anesthesia Transfer of Care Note  Patient: Erika [name]  Procedure(s) Performed: LEFT BREAST SEED BRACKETED LUMPECTOMY, LEFT SENTINEL LYMPH NODE BIOPSY (Left: Breast) SEED TARGETED LEFT AXILLARY LYMPH NODE EXCISIONAL BIOPSY (Left: Axilla)  Patient Location: PACU  Anesthesia Type:GA combined with regional for post-op pain  Level of Consciousness: awake, alert , and oriented  Airway & Oxygen Therapy: Patient Spontanous Breathing and Patient connected to face mask oxygen  Post-op Assessment: Report given to RN and Post -op Vital signs reviewed and stable  Post vital signs: Reviewed and stable  Last Vitals:  Vitals Value Taken Time  BP 124/67 11/04/22 1548  Temp    Pulse 76 11/04/22 1549  Resp 13 11/04/22 1549  SpO2 99 % 11/04/22 1549  Vitals shown include unfiled device data.  Last Pain:  Vitals:   11/04/22 1204  TempSrc: Oral  PainSc: 0-No pain      Patients Stated Pain Goal: 7 (11/04/22 1204)  Complications: No notable events documented.

## 2022-11-04 NOTE — Anesthesia Preprocedure Evaluation (Addendum)
Anesthesia Evaluation  Patient identified by MRN, date of birth, ID band Patient awake    Reviewed: Allergy & Precautions, NPO status , Patient's Chart, lab work & pertinent test results  History of Anesthesia Complications Negative for: history of anesthetic complications  Airway Mallampati: II  TM Distance: >3 FB Neck ROM: Full    Dental no notable dental hx.    Pulmonary neg pulmonary ROS   Pulmonary exam normal        Cardiovascular hypertension, Pt. on medications Normal cardiovascular exam  ECHO 5/24  1. Left ventricular ejection fraction, by estimation, is 55 to 60%. The  left ventricle has normal function. The left ventricle has no regional  wall motion abnormalities. Left ventricular diastolic parameters were  normal.   2. Right ventricular systolic function is normal. The right ventricular  size is normal.   3. The mitral valve is normal in structure. No evidence of mitral valve  regurgitation. No evidence of mitral stenosis.   4. The aortic valve is tricuspid. Aortic valve regurgitation is mild. No  aortic stenosis is present.   5. The inferior vena cava is normal in size with greater than 50%  respiratory variability, suggesting right atrial pressure of 3 mmHg.     Neuro/Psych  PSYCHIATRIC DISORDERS Anxiety Depression    negative neurological ROS     GI/Hepatic Neg liver ROS,GERD  ,,  Endo/Other  negative endocrine ROS    Renal/GU negative Renal ROS  negative genitourinary   Musculoskeletal negative musculoskeletal ROS (+)    Abdominal   Peds  Hematology  (+) Blood dyscrasia, anemia   Anesthesia Other Findings Left breast ca  Reproductive/Obstetrics negative OB ROS                             Anesthesia Physical Anesthesia Plan  ASA: 2  Anesthesia Plan: General and Regional   Post-op Pain Management: Tylenol PO (pre-op)*, Celebrex PO (pre-op)* and Regional block*    Induction: Intravenous  PONV Risk Score and Plan: 3 and Treatment may vary due to age or medical condition, Ondansetron and Dexamethasone  Airway Management Planned: LMA and Oral ETT  Additional Equipment: None  Intra-op Plan:   Post-operative Plan: Extubation in OR  Informed Consent: I have reviewed the patients History and Physical, chart, labs and discussed the procedure including the risks, benefits and alternatives for the proposed anesthesia with the patient or authorized representative who has indicated his/her understanding and acceptance.     Dental advisory given  Plan Discussed with: CRNA and Anesthesiologist  Anesthesia Plan Comments: (  )       Anesthesia Quick Evaluation

## 2022-11-04 NOTE — H&P (Signed)
PROVIDER: Matthias Hughs, MD Patient Care Team: [name], PA as PCP - General (Family Medicine) Sabas Sous, MD (Hematology and Oncology) Billie Lade, MD (Radiation Oncology)  MRN: [id number] DOB: [birth year]  Chief Complaint: New Consultation ( RE-CHECK - discuss breast surgery,)   History of Present Illness: Erika [name] is a 62 y.o. female who is seen today for left breast cancer.  Initial history:   Pt presented with a new dx of left breast cancer 05/2022. She had a palpable mass that she discovered. She had diagnostic imaging showing a 2.9 cm mass by mammo and 4.2 cm mass by u/s in the upper outer quadrant. There was also an abnormal lymph node present in the axilla. 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 Node was positive for cancer as well. Breast density is c. She denied breast pain or other breast issues.    Interval history:   Genetic testing was negative. She has been undergoing chemotherapy. She had repeat MR which showed improvement of mass size, but continued non mass enhancement. There were several small areas of nodular enhancement near the prior malignancy. She had a MR guided biopsy of one of these and it showed a radial scar/CSL which was concordant, but excision was recommended. The total size of the prior cancer and nodular regions was 4.7 cm in greatest dimension.   Patient had a hard time with chemotherapy and had an allergic reaction to the Taxotere. This was switched to Abraxane. She did do the cooling cap and although her hair has thinned, she does still have hair on her head. She had to get fluid in blood during chemotherapy.  Repeat MRI 09/24/2022 IMPRESSION: The previously seen rim enhancing mass in the upper-outer quadrant of the left breast is now non masslike enhancement measuring 2.9 cm. Superior and medial to the biopsy proven malignancy are small enhancing nodules with  the largest measuring 4 mm. If the patient is considering breast conservation than MR guided core biopsy of 1 of the more anterior located enhancing masses is recommended.  RECOMMENDATION: MR guided core biopsy of nodular enhancement in the 12 o'clock region of the left breast is recommended.  BI-RADS CATEGORY 4: Suspicious.  MR guided bx 09/25/2022 Breast, left, needle core biopsy, upper (barbell clip) - BENIGN BREAST TISSUE WITH A SMALL RADIAL SCAR/COMPLEX SCLEROSING LESION (3 MM IN GREATEST LINEAR DIMENSION) WITH ASSOCIATED ADENOSIS IN THE BACKGROUND OF IN THE BACKGROUND OF DENSE STROMAL FIBROSIS WITH MILD FIBROCYSTIC CHANGE AND ASSOCIATED FOCAL CALCIFICATIONS  Review of Systems: A complete review of systems was obtained from the patient. I have reviewed this information and discussed as appropriate with the patient. See HPI as well for other ROS.  Review of Systems  All other systems reviewed and are negative.   Medical History: Past Medical History:  Diagnosis Date  Anxiety  GERD (gastroesophageal reflux disease)  History of cancer  Hypertension   Patient Active Problem List  Diagnosis  Malignant neoplasm of upper-outer quadrant of left breast in female, estrogen receptor negative (CMS/HHS-HCC)  Serrated polyp of colon   Past Surgical History:  Procedure Laterality Date  ."Freezing of Cervix."  breast biopsy surgery  CESAREAN SECTION    Allergies  Allergen Reactions  Penicillins Nausea and Other (See Comments)  Makes patient ill   Current Outpatient Medications on File Prior to Visit  Medication Sig Dispense Refill  lisinopriL (ZESTRIL) 20 MG tablet Take 1 tablet  by mouth once daily  rosuvastatin (CRESTOR) 20 MG tablet Take 1 tablet by mouth once daily  sertraline (ZOLOFT) 100 MG tablet Take 100 mg by mouth once daily  aspirin 81 MG EC tablet Take 81 mg by mouth once daily (Patient not taking: Reported on 09/29/2022)  omeprazole (PRILOSEC) 20 MG DR capsule  Take 20 mg by mouth once daily (Patient not taking: Reported on 09/29/2022)   No current facility-administered medications on file prior to visit.   Family History  Problem Relation Age of Onset  High blood pressure (Hypertension) Father  Coronary Artery Disease (Blocked arteries around heart) Father  High blood pressure (Hypertension) Brother  Coronary Artery Disease (Blocked arteries around heart) Brother    Social History   Tobacco Use  Smoking Status Former  Types: Cigarettes  Smokeless Tobacco Never  Tobacco Comments  Quit in college, smoked 1 year while in college    Social History   Socioeconomic History  Marital status: Married  Tobacco Use  Smoking status: Former  Types: Cigarettes  Smokeless tobacco: Never  Tobacco comments:  Quit in college, smoked 1 year while in college  Vaping Use  Vaping status: Never Used  Substance and Sexual Activity  Alcohol use: Yes  Drug use: Never   Social Determinants of Corporate investment banker Strain: Low Risk (01/20/2022)  Received from [employer], [facility]  Overall Financial Resource Strain (CARDIA)  Difficulty of Paying Living Expenses: Not hard at all  Food Insecurity: No Food Insecurity (01/20/2022)  Received from [facility], [facility]  Hunger Vital Sign  Worried About Running Out of Food in the Last Year: Never true  Ran Out of Food in the Last Year: Never true  Physical Activity: Insufficiently Active (01/20/2022)  Received from [facility], [facility]  Exercise Vital Sign  Days of Exercise per Week: 2 days  Minutes of Exercise per Session: 20 min  Stress: Stress Concern Present (01/20/2022)  Received from [facility], [facility]  [employer] of Occupational Health - Occupational Stress Questionnaire  Feeling of Stress : To some extent  Social Connections: Socially Integrated (01/20/2022)  Received from [facility], [facility]  Social Network  How would you rate your  social network (family, work, friends)?: Good participation with social networks   Objective:   Vitals:  BP: 118/77  Pulse: 92  Temp: 36.1 C (97 F)  SpO2: 98%  Weight: 72.1 kg (159 lb)  Height: 172.7 cm (5\' 8" )   Body mass index is 24.18 kg/m.  Head: Normocephalic and atraumatic.  Eyes: Conjunctivae are normal. Pupils are equal, round, and reactive to light. No scleral icterus.  Neck: Normal range of motion. Neck supple. No tracheal deviation present. No thyromegaly present.  Resp: No respiratory distress, normal effort. Breast: both breast still dense. Left breast larger than right. There is no residual palpable mass on the left. I can no longer palpate the left axillary lymph node. Both breasts are ptotic. Abd: Abdomen is soft, non distended and non tender. No masses are palpable. There is no rebound and no guarding.  Neurological: Alert and oriented to person, place, and time. Coordination normal.  Skin: Skin is warm and dry. No rash noted. No diaphoretic. No erythema. No pallor.  Psychiatric: Normal mood and affect. Normal behavior. Judgment and thought content normal.   Labs, Imaging and Diagnostic Testing:  Labs performed June 13 show normal white blood cell count, hematocrit 29.0, platelet count 136,000. CMP normal other than mildly elevated glucose.  Assessment and Plan:   Diagnoses and all orders for this visit:  Malignant neoplasm of upper-outer quadrant of left breast in female, estrogen receptor negative (CMS/HHS-HCC)  I reviewed the MRI images as well as discussed them with radiology today. Given her breast size, it does appear to be possible to do a seed bracketed lumpectomy on the left. We will also do a seed targeted lymph node biopsy and a sentinel lymph node biopsy on the left. This will be a little bit complicated by the fact that there are these small nodules that are probably not new but just now visible now that the chemotherapy has suppressed the  malignancy. We are a bit more likely to have a positive margin or a margin positive for something that still needs to be excised even if it is not malignant.  Given the size of the lumpectomy, we will proceed with oncoplastic reconstruction on the left as well as probable reduction on the right. She has an appointment with plastic surgery tomorrow to discuss this.  I reviewed the risks of surgery including bleeding, infection, seroma, lymphedema, reduced range of motion. We will send her to physical therapy postop.  This will be followed by radiation and completion of her anti-HER2 therapy. We will proceed at the first available opportunity.

## 2022-11-04 NOTE — Interval H&P Note (Signed)
History and Physical Interval Note:  11/04/2022 12:14 PM  Erika [name]  has presented today for surgery, with the diagnosis of LEFT BREAST CANCER.  The various methods of treatment have been discussed with the patient and family. After consideration of risks, benefits and other options for treatment, the patient has consented to  Procedure(s) with comments: LEFT BREAST SEED BRACKETED LUMPECTOMY, LEFT SENTINEL LYMPH NODE BIOPSY (Left) - PEC BLOCK SEED TARGETED LEFT AXILLARY LYMPH NODE EXCISIONAL BIOPSY (Left) as a surgical intervention.  The patient's history has been reviewed, patient examined, no change in status, stable for surgery.  I have reviewed the patient's chart and labs.  Questions were answered to the patient's satisfaction.     [name]

## 2022-11-04 NOTE — Progress Notes (Signed)
Nuc med inj performed by Nuc med staff. No additional sedation required. Pt tol well, emotional support provided

## 2022-11-05 ENCOUNTER — Encounter (HOSPITAL_BASED_OUTPATIENT_CLINIC_OR_DEPARTMENT_OTHER): Payer: Self-pay | Admitting: General Surgery

## 2022-11-05 NOTE — Anesthesia Postprocedure Evaluation (Signed)
Anesthesia Post Note  Patient: Erika [name]  Procedure(s) Performed: LEFT BREAST SEED BRACKETED LUMPECTOMY, LEFT SENTINEL LYMPH NODE BIOPSY (Left: Breast) SEED TARGETED LEFT AXILLARY LYMPH NODE EXCISIONAL BIOPSY (Left: Axilla)     Anesthesia Type: Regional Anesthetic complications: no   No notable events documented.  Last Vitals:  Vitals:   11/04/22 1645 11/04/22 1706  BP: (!) 140/79 (!) 155/85  Pulse: 77 77  Resp: 13 16  Temp:  (!) 36.3 C  SpO2: 94% 98%    Last Pain:  Vitals:   11/04/22 1706  TempSrc: Temporal  PainSc: 3                  Domique Reardon

## 2022-11-06 ENCOUNTER — Other Ambulatory Visit: Payer: Self-pay

## 2022-11-06 ENCOUNTER — Encounter (HOSPITAL_BASED_OUTPATIENT_CLINIC_OR_DEPARTMENT_OTHER): Payer: Self-pay | Admitting: Plastic Surgery

## 2022-11-10 ENCOUNTER — Encounter: Payer: Self-pay | Admitting: *Deleted

## 2022-11-11 NOTE — Progress Notes (Signed)
HEMATOLOGY-ONCOLOGY TELEPHONE VISIT PROGRESS NOTE  I connected with our patient on 11/13/22 at  1:30 PM EDT by telephone and verified that I am speaking with the correct person using two identifiers.  I discussed the limitations, risks, security and privacy concerns of performing an evaluation and management service by telephone and the availability of in person appointments.  I also discussed with the patient that there may be a patient responsible charge related to this service. The patient expressed understanding and agreed to proceed.   History of Present Illness:  Erika [name] is a 62 y.o. female is here because of recent diagnosis of Left Breast cancer. She presents to the clinic for a telephone follow-up to discuss pathology report to determine the appropriate anti-HER2 therapy plan.  She is healing and recovering very well from the recent surgery.  Oncology History  Malignant neoplasm of upper-outer quadrant of left breast in female, estrogen receptor negative (HCC)  05/15/2022 Initial Diagnosis   Palpable mass in the left breast at 1 o'clock position measured 4.2 cm ultrasound-guided biopsy revealed grade 3 IDC ER/PR negative HER2 positive Ki-67 35%, left axillary lymph node: Biopsy positive   05/21/2022 Cancer Staging   Staging form: Breast, AJCC 8th Edition - Clinical stage from 05/21/2022: Stage IIB (cT2, cN1, cM0, G3, ER-, PR-, HER2+) - Signed by [name], MD on 05/21/2022 Stage prefix: Initial diagnosis Histologic grading system: 3 grade system   05/29/2022 Genetic Testing   Negative [facility] Multi-Cancer +RNA Panel.  Report date is 05/29/2022.   The Multi-Cancer + RNA Panel offered by [facility] includes sequencing and/or deletion/duplication analysis of the following 70 genes:  AIP*, ALK, APC*, ATM*, AXIN2*, BAP1*, BARD1*, BLM*, BMPR1A*, BRCA1*, BRCA2*, BRIP1*, CDC73*, CDH1*, CDK4, CDKN1B*, CDKN2A, CHEK2*, CTNNA1*, DICER1*, EPCAM (del/dup only), EGFR, FH*, FLCN*, GREM1 (promoter  dup only), HOXB13, KIT, LZTR1, MAX*, MBD4, MEN1*, MET, MITF, MLH1*, MSH2*, MSH3*, MSH6*, MUTYH*, NF1*, NF2*, NTHL1*, PALB2*, PDGFRA, PMS2*, POLD1*, POLE*, POT1*, PRKAR1A*, PTCH1*, PTEN*, RAD51C*, RAD51D*, RB1*, RET, SDHA* (sequencing only), SDHAF2*, SDHB*, SDHC*, SDHD*, SMAD4*, SMARCA4*, SMARCB1*, SMARCE1*, STK11*, SUFU*, TMEM127*, TP53*, TSC1*, TSC2*, VHL*. RNA analysis is performed for * genes.   06/05/2022 -  Chemotherapy   Patient is on Treatment Plan : BREAST  Docetaxel + Carboplatin + Trastuzumab + Pertuzumab  (TCHP) q21d        REVIEW OF SYSTEMS:   Constitutional: Denies fevers, chills or abnormal weight loss All other systems were reviewed with the patient and are negative. 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   Treatment plan: 1. Neoadjuvant chemotherapy with TCH Perjeta 6 cycles completed 09/18/2022 (Taxotere switched with Abraxane) followed by Herceptin Perjeta maintenance versus Kadcyla maintenance (based on response to neoadjuvant chemo) for 1 year 2. Followed by breast conserving surgery if possible with sentinel lymph node study 3. 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   11/04/2022: Left lumpectomy: No residual invasive cancer identified.  0/4 lymph  nodes negative pathologic complete response  Based on the  above pathology report the recommended adjuvant treatment plan will be with Herceptin and Perjeta. She has a vacation coming up at [facility] and hence we will postpone 9/5 treatment till the following week Return to clinic every 3 weeks for Herceptin Perjeta and every 6 weeks for follow-up with me.    I discussed the assessment and treatment plan with the patient. The patient was provided an opportunity to ask questions and all were answered. The patient agreed with the plan and demonstrated an understanding of the instructions. The patient was advised to call back or seek an in-person evaluation if the symptoms worsen or if the condition fails to improve as anticipated.   I provided 12 minutes of non-face-to-face time during this encounter.  This includes time for charting and coordination of care   Tamsen Meek, MD  I Janan Ridge am acting as a scribe for Dr.Vinay Gudena  I have reviewed the above documentation for accuracy and completeness, and I agree with the above.

## 2022-11-13 ENCOUNTER — Encounter: Payer: Self-pay | Admitting: Hematology and Oncology

## 2022-11-13 ENCOUNTER — Inpatient Hospital Stay: Payer: 59 | Attending: Hematology and Oncology | Admitting: Hematology and Oncology

## 2022-11-13 DIAGNOSIS — C50412 Malignant neoplasm of upper-outer quadrant of left female breast: Secondary | ICD-10-CM | POA: Diagnosis not present

## 2022-11-13 DIAGNOSIS — Z79899 Other long term (current) drug therapy: Secondary | ICD-10-CM | POA: Insufficient documentation

## 2022-11-13 DIAGNOSIS — Z171 Estrogen receptor negative status [ER-]: Secondary | ICD-10-CM | POA: Diagnosis not present

## 2022-11-13 DIAGNOSIS — Z5112 Encounter for antineoplastic immunotherapy: Secondary | ICD-10-CM | POA: Insufficient documentation

## 2022-11-13 DIAGNOSIS — Z79624 Long term (current) use of inhibitors of nucleotide synthesis: Secondary | ICD-10-CM | POA: Insufficient documentation

## 2022-11-13 DIAGNOSIS — Z7982 Long term (current) use of aspirin: Secondary | ICD-10-CM | POA: Insufficient documentation

## 2022-11-13 MED ORDER — CHLORHEXIDINE GLUCONATE CLOTH 2 % EX PADS: 6.0000 | MEDICATED_PAD | Freq: Once | CUTANEOUS | Status: DC

## 2022-11-13 NOTE — Assessment & Plan Note (Signed)
stage IIB ER/PR negative, HER2 positive breast cancer here today for evaluation and follow-up prior to receiving cycle 6 of neoadjuvant Abraxane, carboplatin, Herceptin, and Perjeta.   Treatment plan: 1. Neoadjuvant chemotherapy with TCH Perjeta 6 cycles completed 09/18/2022 (Taxotere switched with Abraxane) followed by Herceptin Perjeta maintenance versus Kadcyla maintenance (based on response to neoadjuvant chemo) for 1 year 2. Followed by breast conserving surgery if possible with sentinel lymph node study 3. 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   11/04/2022: Left lumpectomy: No residual invasive cancer identified.  0/4 lymph nodes negative pathologic complete response  Based on the above pathology report the recommended adjuvant treatment plan will be with Herceptin and Perjeta.  Return to clinic every 3 weeks for Herceptin Perjeta and every 6 weeks for follow-up with me.

## 2022-11-13 NOTE — Progress Notes (Signed)
DISCONTINUE ON PATHWAY REGIMEN - Breast     Cycle 1: A cycle is 21 days:     Pertuzumab      Trastuzumab-xxxx      Docetaxel      Carboplatin    Cycles 2 through 6: A cycle is every 21 days:     Pertuzumab      Trastuzumab-xxxx      Docetaxel      Carboplatin   **Always confirm dose/schedule in your pharmacy ordering system**  REASON: Other Reason PRIOR TREATMENT: [id number]: Docetaxel + Carboplatin + Trastuzumab IV + Pertuzumab IV (TCHP IV) q21 Days x 6 Cycles TREATMENT RESPONSE: Complete Response (CR)  START ON PATHWAY REGIMEN - Breast     A cycle is every 21 days:     Trastuzumab-xxxx      Pertuzumab   **Always confirm dose/schedule in your pharmacy ordering system**  Patient Characteristics: Post-Neoadjuvant Therapy and Resection, M0, HER2 Positive, ER Negative, No Residual Disease, Adjuvant Targeted Therapy After Neoadjuvant Chemo/Targeted Therapy Therapeutic Status: Post-Neoadjuvant Therapy and Resection, M0 Residual Invasive Disease Post-Neoadjuvant Therapy<= No ER Status: Negative (-) HER2 Status: Positive (+) PR Status: Negative (-) Intent of Therapy: Curative Intent, Discussed with Patient

## 2022-11-13 NOTE — Progress Notes (Signed)

## 2022-11-13 NOTE — Anesthesia Preprocedure Evaluation (Signed)
Anesthesia Evaluation  Patient identified by MRN, date of birth, ID band Patient awake    Reviewed: Allergy & Precautions, NPO status , Patient's Chart, lab work & pertinent test results  History of Anesthesia Complications Negative for: history of anesthetic complications  Airway Mallampati: II  TM Distance: >3 FB Neck ROM: Full    Dental no notable dental hx.    Pulmonary neg pulmonary ROS   Pulmonary exam normal        Cardiovascular hypertension, Pt. on medications Normal cardiovascular exam  ECHO 5/24  1. Left ventricular ejection fraction, by estimation, is 55 to 60%. The  left ventricle has normal function. The left ventricle has no regional  wall motion abnormalities. Left ventricular diastolic parameters were  normal.   2. Right ventricular systolic function is normal. The right ventricular  size is normal.   3. The mitral valve is normal in structure. No evidence of mitral valve  regurgitation. No evidence of mitral stenosis.   4. The aortic valve is tricuspid. Aortic valve regurgitation is mild. No  aortic stenosis is present.   5. The inferior vena cava is normal in size with greater than 50%  respiratory variability, suggesting right atrial pressure of 3 mmHg.     Neuro/Psych  PSYCHIATRIC DISORDERS Anxiety Depression    negative neurological ROS     GI/Hepatic Neg liver ROS,GERD  Medicated,,  Endo/Other  negative endocrine ROS    Renal/GU negative Renal ROS  negative genitourinary   Musculoskeletal negative musculoskeletal ROS (+)    Abdominal   Peds  Hematology  (+) Blood dyscrasia, anemia   Anesthesia Other Findings Left breast ca  Reproductive/Obstetrics negative OB ROS                             Anesthesia Physical Anesthesia Plan  ASA: 2  Anesthesia Plan: General   Post-op Pain Management: Tylenol PO (pre-op)* and Celebrex PO (pre-op)*   Induction:  Intravenous  PONV Risk Score and Plan: 3 and Treatment may vary due to age or medical condition, Ondansetron and Dexamethasone  Airway Management Planned: LMA and Oral ETT  Additional Equipment: None  Intra-op Plan:   Post-operative Plan: Extubation in OR  Informed Consent: I have reviewed the patients History and Physical, chart, labs and discussed the procedure including the risks, benefits and alternatives for the proposed anesthesia with the patient or authorized representative who has indicated his/her understanding and acceptance.     Dental advisory given  Plan Discussed with: CRNA and Anesthesiologist  Anesthesia Plan Comments: (  )       Anesthesia Quick Evaluation

## 2022-11-14 ENCOUNTER — Ambulatory Visit (HOSPITAL_BASED_OUTPATIENT_CLINIC_OR_DEPARTMENT_OTHER): Payer: 59 | Admitting: Anesthesiology

## 2022-11-14 ENCOUNTER — Other Ambulatory Visit: Payer: Self-pay

## 2022-11-14 ENCOUNTER — Encounter: Payer: Self-pay | Admitting: *Deleted

## 2022-11-14 ENCOUNTER — Ambulatory Visit (HOSPITAL_BASED_OUTPATIENT_CLINIC_OR_DEPARTMENT_OTHER)
Admission: RE | Admit: 2022-11-14 | Discharge: 2022-11-14 | Disposition: A | Discharge status: Home / Self Care | Payer: 59 | Attending: Plastic Surgery | Admitting: Plastic Surgery

## 2022-11-14 ENCOUNTER — Encounter (HOSPITAL_BASED_OUTPATIENT_CLINIC_OR_DEPARTMENT_OTHER)
Admission: RE | Disposition: A | Discharge status: Home / Self Care | Payer: Self-pay | Source: Home / Self Care | Attending: Plastic Surgery

## 2022-11-14 ENCOUNTER — Encounter (HOSPITAL_BASED_OUTPATIENT_CLINIC_OR_DEPARTMENT_OTHER): Payer: Self-pay | Admitting: Plastic Surgery

## 2022-11-14 ENCOUNTER — Encounter: Payer: Self-pay | Admitting: Hematology and Oncology

## 2022-11-14 DIAGNOSIS — N6021 Fibroadenosis of right breast: Secondary | ICD-10-CM | POA: Insufficient documentation

## 2022-11-14 DIAGNOSIS — Z79899 Other long term (current) drug therapy: Secondary | ICD-10-CM | POA: Insufficient documentation

## 2022-11-14 DIAGNOSIS — Z171 Estrogen receptor negative status [ER-]: Secondary | ICD-10-CM | POA: Insufficient documentation

## 2022-11-14 DIAGNOSIS — Z9889 Other specified postprocedural states: Secondary | ICD-10-CM | POA: Insufficient documentation

## 2022-11-14 DIAGNOSIS — Z9221 Personal history of antineoplastic chemotherapy: Secondary | ICD-10-CM | POA: Diagnosis not present

## 2022-11-14 DIAGNOSIS — N6022 Fibroadenosis of left breast: Secondary | ICD-10-CM | POA: Insufficient documentation

## 2022-11-14 DIAGNOSIS — C50412 Malignant neoplasm of upper-outer quadrant of left female breast: Secondary | ICD-10-CM

## 2022-11-14 DIAGNOSIS — K219 Gastro-esophageal reflux disease without esophagitis: Secondary | ICD-10-CM | POA: Insufficient documentation

## 2022-11-14 DIAGNOSIS — N6332 Unspecified lump in axillary tail of the left breast: Secondary | ICD-10-CM | POA: Diagnosis not present

## 2022-11-14 DIAGNOSIS — I10 Essential (primary) hypertension: Secondary | ICD-10-CM | POA: Diagnosis not present

## 2022-11-14 HISTORY — PX: REDUCTION MAMMAPLASTY: SUR839

## 2022-11-14 HISTORY — DX: Family history of other specified conditions: Z84.89

## 2022-11-14 HISTORY — PX: BREAST RECONSTRUCTION: SHX9

## 2022-11-14 HISTORY — PX: BREAST REDUCTION SURGERY: SHX8

## 2022-11-14 SURGERY — MAMMOPLASTY, REDUCTION
Anesthesia: General | Site: Breast | Laterality: Right

## 2022-11-14 MED ORDER — GABAPENTIN 300 MG PO CAPS
ORAL_CAPSULE | ORAL | Status: AC
  Filled 2022-11-14: qty 1

## 2022-11-14 MED ORDER — OXYCODONE HCL 5 MG PO TABS: 5.0000 mg | ORAL_TABLET | Freq: Four times a day (QID) | ORAL | 0 refills | Status: DC | PRN

## 2022-11-14 MED ORDER — ONDANSETRON HCL 4 MG/2ML IJ SOLN
INTRAMUSCULAR | Status: DC | PRN
  Administered 2022-11-14: 4 mg via INTRAVENOUS

## 2022-11-14 MED ORDER — ROCURONIUM BROMIDE 100 MG/10ML IV SOLN
INTRAVENOUS | Status: DC | PRN
  Administered 2022-11-14: 20 mg via INTRAVENOUS
  Administered 2022-11-14: 50 mg via INTRAVENOUS

## 2022-11-14 MED ORDER — ACETAMINOPHEN 500 MG PO TABS
ORAL_TABLET | ORAL | Status: AC
  Filled 2022-11-14: qty 2

## 2022-11-14 MED ORDER — PHENYLEPHRINE 80 MCG/ML (10ML) SYRINGE FOR IV PUSH (FOR BLOOD PRESSURE SUPPORT)
PREFILLED_SYRINGE | INTRAVENOUS | Status: AC
  Filled 2022-11-14: qty 10

## 2022-11-14 MED ORDER — CELECOXIB 200 MG PO CAPS: 200.0000 mg | ORAL_CAPSULE | Freq: Once | ORAL | Status: DC

## 2022-11-14 MED ORDER — BUPIVACAINE HCL (PF) 0.5 % IJ SOLN
INTRAMUSCULAR | Status: DC | PRN
  Administered 2022-11-14: 30 mL

## 2022-11-14 MED ORDER — ACETAMINOPHEN 500 MG PO TABS: 1000.0000 mg | ORAL_TABLET | Freq: Once | ORAL | Status: DC

## 2022-11-14 MED ORDER — EPHEDRINE SULFATE (PRESSORS) 50 MG/ML IJ SOLN
INTRAMUSCULAR | Status: DC | PRN
  Administered 2022-11-14 (×2): 10 mg via INTRAVENOUS

## 2022-11-14 MED ORDER — PHENYLEPHRINE HCL (PRESSORS) 10 MG/ML IV SOLN
INTRAVENOUS | Status: DC | PRN
Start: 2022-11-14 — End: 2022-11-14
  Administered 2022-11-14: 160 ug via INTRAVENOUS

## 2022-11-14 MED ORDER — 0.9 % SODIUM CHLORIDE (POUR BTL) OPTIME
TOPICAL | Status: DC | PRN
  Administered 2022-11-14: 225 mL

## 2022-11-14 MED ORDER — FENTANYL CITRATE (PF) 100 MCG/2ML IJ SOLN
INTRAMUSCULAR | Status: AC
  Filled 2022-11-14: qty 2

## 2022-11-14 MED ORDER — GABAPENTIN 300 MG PO CAPS
300.0000 mg | ORAL_CAPSULE | ORAL | Status: AC
  Administered 2022-11-14: 300 mg via ORAL

## 2022-11-14 MED ORDER — MIDAZOLAM HCL 5 MG/5ML IJ SOLN
INTRAMUSCULAR | Status: DC | PRN
  Administered 2022-11-14: 2 mg via INTRAVENOUS

## 2022-11-14 MED ORDER — ACETAMINOPHEN 325 MG PO TABS: 325.0000 mg | ORAL_TABLET | ORAL | Status: DC | PRN

## 2022-11-14 MED ORDER — ACETAMINOPHEN 160 MG/5ML PO SOLN: 325.0000 mg | ORAL | Status: DC | PRN

## 2022-11-14 MED ORDER — BUPIVACAINE HCL (PF) 0.5 % IJ SOLN
INTRAMUSCULAR | Status: AC
  Filled 2022-11-14: qty 30

## 2022-11-14 MED ORDER — HYDROMORPHONE HCL 1 MG/ML IJ SOLN
INTRAMUSCULAR | Status: AC
  Filled 2022-11-14: qty 0.5

## 2022-11-14 MED ORDER — LIDOCAINE HCL (CARDIAC) PF 100 MG/5ML IV SOSY
PREFILLED_SYRINGE | INTRAVENOUS | Status: DC | PRN
  Administered 2022-11-14: 50 mg via INTRAVENOUS

## 2022-11-14 MED ORDER — CELECOXIB 200 MG PO CAPS
ORAL_CAPSULE | ORAL | Status: AC
  Filled 2022-11-14: qty 1

## 2022-11-14 MED ORDER — ROCURONIUM BROMIDE 10 MG/ML (PF) SYRINGE
PREFILLED_SYRINGE | INTRAVENOUS | Status: AC
  Filled 2022-11-14: qty 10

## 2022-11-14 MED ORDER — ONDANSETRON HCL 4 MG/2ML IJ SOLN: 4.0000 mg | Freq: Once | INTRAMUSCULAR | Status: DC | PRN

## 2022-11-14 MED ORDER — MEPERIDINE HCL 25 MG/ML IJ SOLN: 6.2500 mg | INTRAMUSCULAR | Status: DC | PRN

## 2022-11-14 MED ORDER — ONDANSETRON HCL 4 MG/2ML IJ SOLN
INTRAMUSCULAR | Status: AC
  Filled 2022-11-14: qty 2

## 2022-11-14 MED ORDER — EPHEDRINE 5 MG/ML INJ
INTRAVENOUS | Status: AC
  Filled 2022-11-14: qty 5

## 2022-11-14 MED ORDER — FENTANYL CITRATE (PF) 100 MCG/2ML IJ SOLN: INTRAMUSCULAR | Status: DC | PRN

## 2022-11-14 MED ORDER — LIDOCAINE 2% (20 MG/ML) 5 ML SYRINGE
INTRAMUSCULAR | Status: AC
  Filled 2022-11-14: qty 5

## 2022-11-14 MED ORDER — DEXAMETHASONE SODIUM PHOSPHATE 4 MG/ML IJ SOLN
INTRAMUSCULAR | Status: DC | PRN
  Administered 2022-11-14: 5 mg via INTRAVENOUS

## 2022-11-14 MED ORDER — ACETAMINOPHEN 500 MG PO TABS
1000.0000 mg | ORAL_TABLET | ORAL | Status: AC
  Administered 2022-11-14: 1000 mg via ORAL

## 2022-11-14 MED ORDER — ATROPINE SULFATE 0.4 MG/ML IV SOLN
INTRAVENOUS | Status: AC
  Filled 2022-11-14: qty 1

## 2022-11-14 MED ORDER — OXYCODONE HCL 5 MG/5ML PO SOLN: 5.0000 mg | Freq: Once | ORAL | Status: AC | PRN

## 2022-11-14 MED ORDER — FENTANYL CITRATE (PF) 100 MCG/2ML IJ SOLN
INTRAMUSCULAR | Status: DC | PRN
  Administered 2022-11-14: 100 ug via INTRAVENOUS
  Administered 2022-11-14 (×2): 25 ug via INTRAVENOUS
  Administered 2022-11-14: 50 ug via INTRAVENOUS

## 2022-11-14 MED ORDER — MIDAZOLAM HCL 2 MG/2ML IJ SOLN
INTRAMUSCULAR | Status: AC
  Filled 2022-11-14: qty 2

## 2022-11-14 MED ORDER — DEXAMETHASONE SODIUM PHOSPHATE 10 MG/ML IJ SOLN
INTRAMUSCULAR | Status: AC
  Filled 2022-11-14: qty 1

## 2022-11-14 MED ORDER — PROPOFOL 10 MG/ML IV BOLUS
INTRAVENOUS | Status: DC | PRN
  Administered 2022-11-14: 150 mg via INTRAVENOUS

## 2022-11-14 MED ORDER — CEFAZOLIN SODIUM-DEXTROSE 2-4 GM/100ML-% IV SOLN
INTRAVENOUS | Status: AC
  Filled 2022-11-14: qty 100

## 2022-11-14 MED ORDER — OXYCODONE HCL 5 MG PO TABS
5.0000 mg | ORAL_TABLET | Freq: Once | ORAL | Status: AC | PRN
  Administered 2022-11-14: 5 mg via ORAL

## 2022-11-14 MED ORDER — OXYCODONE HCL 5 MG PO TABS
ORAL_TABLET | ORAL | Status: AC
  Filled 2022-11-14: qty 1

## 2022-11-14 MED ORDER — LACTATED RINGERS IV SOLN: INTRAVENOUS | Status: DC | PRN

## 2022-11-14 MED ORDER — SUGAMMADEX SODIUM 200 MG/2ML IV SOLN
INTRAVENOUS | Status: DC | PRN
Start: 2022-11-14 — End: 2022-11-14
  Administered 2022-11-14: 200 mg via INTRAVENOUS

## 2022-11-14 MED ORDER — FENTANYL CITRATE (PF) 100 MCG/2ML IJ SOLN
25.0000 ug | INTRAMUSCULAR | Status: DC | PRN
  Administered 2022-11-14: 50 ug via INTRAVENOUS
  Administered 2022-11-14 (×2): 25 ug via INTRAVENOUS

## 2022-11-14 MED ORDER — CEFAZOLIN SODIUM-DEXTROSE 2-4 GM/100ML-% IV SOLN
2.0000 g | INTRAVENOUS | Status: AC
  Administered 2022-11-14: 2 g via INTRAVENOUS

## 2022-11-14 MED ORDER — CELECOXIB 200 MG PO CAPS
200.0000 mg | ORAL_CAPSULE | ORAL | Status: AC
  Administered 2022-11-14: 200 mg via ORAL

## 2022-11-14 MED ORDER — SUCCINYLCHOLINE CHLORIDE 200 MG/10ML IV SOSY
PREFILLED_SYRINGE | INTRAVENOUS | Status: AC
  Filled 2022-11-14: qty 10

## 2022-11-14 SURGICAL SUPPLY — 73 items
ADH SKN CLS APL DERMABOND .7 (GAUZE/BANDAGES/DRESSINGS) ×4
APL PRP STRL LF DISP 70% ISPRP (MISCELLANEOUS) ×4
BAG DECANTER FOR FLEXI CONT (MISCELLANEOUS) ×3 IMPLANT
BINDER BREAST 3XL (GAUZE/BANDAGES/DRESSINGS) IMPLANT
BINDER BREAST LRG (GAUZE/BANDAGES/DRESSINGS) IMPLANT
BINDER BREAST MEDIUM (GAUZE/BANDAGES/DRESSINGS) IMPLANT
BINDER BREAST XLRG (GAUZE/BANDAGES/DRESSINGS) IMPLANT
BINDER BREAST XXLRG (GAUZE/BANDAGES/DRESSINGS) IMPLANT
BLADE SURG 10 STRL SS (BLADE) ×12 IMPLANT
BLADE SURG 15 STRL LF DISP TIS (BLADE) IMPLANT
BLADE SURG 15 STRL SS (BLADE)
BNDG CMPR 6 X 5 YARDS HK CLSR (GAUZE/BANDAGES/DRESSINGS)
BNDG ELASTIC 6INX 5YD STR LF (GAUZE/BANDAGES/DRESSINGS) IMPLANT
BNDG GAUZE DERMACEA FLUFF 4 (GAUZE/BANDAGES/DRESSINGS) ×6 IMPLANT
BNDG GZE DERMACEA 4 6PLY (GAUZE/BANDAGES/DRESSINGS) ×4
CANISTER SUCT 1200ML W/VALVE (MISCELLANEOUS) ×3 IMPLANT
CHLORAPREP W/TINT 26 (MISCELLANEOUS) ×6 IMPLANT
COVER BACK TABLE 60X90IN (DRAPES) ×3 IMPLANT
COVER MAYO STAND STRL (DRAPES) ×3 IMPLANT
DERMABOND ADVANCED .7 DNX12 (GAUZE/BANDAGES/DRESSINGS) ×6 IMPLANT
DRAIN CHANNEL 15F RND FF W/TCR (WOUND CARE) ×3 IMPLANT
DRAIN CHANNEL 19F RND (DRAIN) IMPLANT
DRAPE TOP ARMCOVERS (MISCELLANEOUS) ×3 IMPLANT
DRAPE U-SHAPE 76X120 STRL (DRAPES) ×3 IMPLANT
DRAPE UTILITY XL STRL (DRAPES) ×3 IMPLANT
ELECT BLADE 4.0 EZ CLEAN MEGAD (MISCELLANEOUS) ×2
ELECT COATED BLADE 2.86 ST (ELECTRODE) ×3 IMPLANT
ELECT REM PT RETURN 9FT ADLT (ELECTROSURGICAL) ×2
ELECTRODE BLDE 4.0 EZ CLN MEGD (MISCELLANEOUS) ×3 IMPLANT
ELECTRODE REM PT RTRN 9FT ADLT (ELECTROSURGICAL) ×3 IMPLANT
EVACUATOR SILICONE 100CC (DRAIN) ×3 IMPLANT
GAUZE PAD ABD 8X10 STRL (GAUZE/BANDAGES/DRESSINGS) ×6 IMPLANT
GLOVE BIO SURGEON STRL SZ 6 (GLOVE) ×6 IMPLANT
GLOVE BIO SURGEON STRL SZ 6.5 (GLOVE) IMPLANT
GLOVE INDICATOR 7.0 STRL GRN (GLOVE) IMPLANT
GLOVE INDICATOR 7.5 STRL GRN (GLOVE) IMPLANT
GLOVE SURG SS PI 7.0 STRL IVOR (GLOVE) IMPLANT
GOWN STRL REUS W/ TWL LRG LVL3 (GOWN DISPOSABLE) ×6 IMPLANT
GOWN STRL REUS W/TWL LRG LVL3 (GOWN DISPOSABLE) ×6
IV NS 500ML (IV SOLUTION) ×2
IV NS 500ML BAXH (IV SOLUTION) ×3 IMPLANT
KIT FILL ASEPTIC TRANSFER (MISCELLANEOUS) IMPLANT
MARKER SKIN DUAL TIP RULER LAB (MISCELLANEOUS) IMPLANT
NDL HYPO 25X1 1.5 SAFETY (NEEDLE) ×3 IMPLANT
NEEDLE HYPO 25X1 1.5 SAFETY (NEEDLE) ×2
NS IRRIG 1000ML POUR BTL (IV SOLUTION) ×3 IMPLANT
PACK BASIN DAY SURGERY FS (CUSTOM PROCEDURE TRAY) ×3 IMPLANT
PENCIL SMOKE EVACUATOR (MISCELLANEOUS) ×3 IMPLANT
PIN SAFETY STERILE (MISCELLANEOUS) ×3 IMPLANT
PUNCH BIOPSY 4MM DISP (MISCELLANEOUS) IMPLANT
SHEET MEDIUM DRAPE 40X70 STRL (DRAPES) ×6 IMPLANT
SLEEVE SCD COMPRESS KNEE MED (STOCKING) ×3 IMPLANT
SPIKE FLUID TRANSFER (MISCELLANEOUS) IMPLANT
SPONGE T-LAP 18X18 ~~LOC~~+RFID (SPONGE) ×9 IMPLANT
STAPLER VISISTAT 35W (STAPLE) ×3 IMPLANT
SUT ETHILON 2 0 FS 18 (SUTURE) ×3 IMPLANT
SUT MNCRL AB 4-0 PS2 18 (SUTURE) ×3 IMPLANT
SUT PDS 3-0 CT2 (SUTURE)
SUT PDS AB 2-0 CT2 27 (SUTURE) IMPLANT
SUT PDS II 3-0 CT2 27 ABS (SUTURE) IMPLANT
SUT PROLENE 2 0 CT2 30 (SUTURE) IMPLANT
SUT VIC AB 3-0 PS1 18 (SUTURE) ×14
SUT VIC AB 3-0 PS1 18XBRD (SUTURE) IMPLANT
SUT VIC AB 3-0 SH 27 (SUTURE)
SUT VIC AB 3-0 SH 27X BRD (SUTURE) IMPLANT
SUT VIC AB 4-0 PS2 18 (SUTURE) ×3 IMPLANT
SYR 50ML LL SCALE MARK (SYRINGE) IMPLANT
SYR BULB IRRIG 60ML STRL (SYRINGE) ×3 IMPLANT
SYR CONTROL 10ML LL (SYRINGE) ×3 IMPLANT
TOWEL GREEN STERILE FF (TOWEL DISPOSABLE) ×6 IMPLANT
TUBE CONNECTING 20X1/4 (TUBING) ×3 IMPLANT
UNDERPAD 30X36 HEAVY ABSORB (UNDERPADS AND DIAPERS) ×6 IMPLANT
YANKAUER SUCT BULB TIP NO VENT (SUCTIONS) ×3 IMPLANT

## 2022-11-14 NOTE — H&P (Signed)
Subjective  Patient ID: Erika [name] is a 62 y.o. female.  HPI  Patient of Drs. Byerly and here for staged breast reconstruction. Presented with palpable left breast mass. MMG/US showed left breast 1 o clock 4.2 cm mass. Biopsy showed IDC, ER/PR-, Her2 +. She was also noted to have palpable LN on presentation and biopsy of this positive for metastatic disease.   Initial MRI showed 2 enhancing abnormal LN in left axilla, 2.9 cm rim enhancing mass left UOW.  Underwent neoadjuvant chemotherapy. Final MRI demonstrated residual NME 2.9 cm. Superior and medial to the biopsy proven malignancy are enhancing nodules with the largest measuring 4 mm Lymph node in the left axilla is no longer pathologically enlarged. MR guided biopsy of one mass demonstrated CSL felt concordant.   Patient underwent lumpectomy SLN with final pathology no residual invasive carcinoma, 0/5 SLN.  Genetics negative.   Current 38 C. Wt down 20 lb since start of chemotherapy.  Lives with spouse. 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   Review of Systems  Constitutional: Positive for fatigue and unexpected weight change.  Hematological: Bruises/bleeds easily.  Psychiatric/Behavioral: Positive for dysphoric mood. The patient is nervous/anxious.  All other systems reviewed and are negative.  Objective  Physical Exam  Cardiovascular: Normal rate. Normal heart sounds  Pulmonary/Chest Effort normal. Clear to auscultation  Left chest port Breasts: left breast ecchymoses post biopsy SN to nipple R 29 cm L 30 cm BW R 20 L 21 cm Nipple to IMF R 8 L 9 cm  Assessment/Plan   Malignant neoplasm of upper-outer quadrant of left breast in female, estrogen receptor negative  Neoadjuvant chemotherapy S/p left lumpectomy SLN   Plan oncoplastic reconstruction 7-10 d post lumpectomy to ensure pathologic clearance. Reviewed reduction with anchor type scars, drains, post operative visits  and limitations, recovery. Diminished sensation nipple and breast skin, risk of nipple loss, wound healing problems, asymmetry. Discussed will have some contraction of breast volume and increased firmness with radiation, less ptosis with aging. This can result in asymmetries long term. Discussed changes with wt gain, loss, aging. Discussed lumpectomy alone can result in NAC displacement, distortion contour breast following lumpectomy and RT, asymmetry breast volume and NAC position. Reviewed purpose of this type reconstruction to prevent these. Reviewed breast lift or trying to correct NAC displacement post RT more difficult. Counseled I cannot assure her cup size. Reviewed can defer surgery until after therapies complete. In this setting would wait at least 6 months from end RT for any surgery. Reviewed increased risks complications in setting RT. Reviewed any complications from oncoplastic reconstruction procedure may delay start adjuvant therapies.   Additional risks including but not limited to bleeding, infection, seroma, hematoma, damage to adjacent structures, need for additional surgeries, unacceptable cosmetic result, blood clots in legs or lungs reviewed.  Glenna Fellows, MD [facility] Plastic & Re[facility] Surgery  Office/ physician access line after hours [phone]

## 2022-11-14 NOTE — Discharge Instructions (Signed)
  Post Anesthesia Home Care Instructions  Activity: Get plenty of rest for the remainder of the day. A responsible individual must stay with you for 24 hours following the procedure.  For the next 24 hours, DO NOT: -Drive a car -Advertising copywriter -Drink alcoholic beverages -Take any medication unless instructed by your physician -Make any legal decisions or sign important papers.  Meals: Start with liquid foods such as gelatin or soup. Progress to regular foods as tolerated. Avoid greasy, spicy, heavy foods. If nausea and/or vomiting occur, drink only clear liquids until the nausea and/or vomiting subsides. Call your physician if vomiting continues.  Special Instructions/Symptoms: Your throat may feel dry or sore from the anesthesia or the breathing tube placed in your throat during surgery. If this causes discomfort, gargle with warm salt water. The discomfort should disappear within 24 hours.  If you had a scopolamine patch placed behind your ear for the management of post- operative nausea and/or vomiting:  1. The medication in the patch is effective for 72 hours, after which it should be removed.  Wrap patch in a tissue and discard in the trash. Wash hands thoroughly with soap and water. 2. You may remove the patch earlier than 72 hours if you experience unpleasant side effects which may include dry mouth, dizziness or visual disturbances. 3. Avoid touching the patch. Wash your hands with soap and water after contact with the patch.  No tylenol or ibuprofen until after 12:30pm today if needed.

## 2022-11-14 NOTE — Transfer of Care (Signed)
Immediate Anesthesia Transfer of Care Note  Patient: Erika [name]  Procedure(s) Performed: MAMMARY REDUCTION  (BREAST) (Right: Breast) LEFT  ONCOPLASTIC BREAST RECONSTRUCTION (Left: Breast)  Patient Location: PACU  Anesthesia Type:General  Level of Consciousness: awake, alert , oriented, drowsy, and patient cooperative  Airway & Oxygen Therapy: Patient Spontanous Breathing and Patient connected to face mask oxygen  Post-op Assessment: Report given to RN and Post -op Vital signs reviewed and stable  Post vital signs: Reviewed and stable  Last Vitals:  Vitals Value Taken Time  BP 142/84 11/14/22 1049  Temp    Pulse 102 11/14/22 1051  Resp 10 11/14/22 1051  SpO2 99 % 11/14/22 1051  Vitals shown include unfiled device data.  Last Pain:  Vitals:   11/14/22 0628  TempSrc: Oral  PainSc: 0-No pain      Patients Stated Pain Goal: 3 (11/14/22 4098)  Complications: No notable events documented.

## 2022-11-14 NOTE — Op Note (Signed)
Operative Note   DATE OF OPERATION: 8.9.2024  LOCATION: [facility]-outpatient  SURGICAL DIVISION: Plastic Surgery  PREOPERATIVE DIAGNOSES:  1. Left breast cancer UOQ ER-  POSTOPERATIVE DIAGNOSES:  same  PROCEDURE:  Bilateral breast mastopexy (CPT 19316)  SURGEON: Glenna Fellows MD MBA  ASSISTANT: none  ANESTHESIA:  General.   EBL: 55 ml  COMPLICATIONS: None immediate.   INDICATIONS FOR PROCEDURE:  The patient, Erika [name], is a 62 y.o. female born on 05/06/1960, is here for staged breast reconstruction following left lumpectomy.    FINDINGS: Seroma present within left superior pole breast. Left breast resection 76 g Right breast resection 108 g  DESCRIPTION OF PROCEDURE:  The patient was marked standing in the preoperative area to mark sternal notch, chest midline, anterior axillary lines, inframammary folds. The location of new nipple areolar complex was marked at level of on inframammary fold on anterior surface breast by palpation. This was marked symmetric over bilateral breasts. With aid of Wise pattern marker, location of new nipple areolar complex and vertical limbs (7 cm) were marked by displacement of breasts along meridian. The patient was taken to the operating room. SCDs were placed and IV antibiotics were given. The patient's operative site was prepped and draped in a sterile fashion. A time out was performed and all information was confirmed to be correct.     I began on left breast. Over left breast, superior medial pedicle marked and nipple areolar complex incised with 45 mm diameter marker at border areola margin. Pedicle deepithlialized and developed to chest wall. Lumpectomy cavity noted over superior pole and seroma drained from this area. Medial and lateral flaps developed. Lower pole soft tissue maintained on inferiorly based dermoglandular pedicle and de epithelialized. This was utilized as Research scientist (life sciences) augmentation flap and advanced superiorly beneath  pedicle and secured to chest wall with interrupted 2-0 PDS suture. Breast tailor tacked closed.    I then directed attention to right breast where superior medial pedicle designed. NAC incised with 45 mm diameter marker at border areola margin. The pedicle was deepithelialized. Pedicle developed to chest wall. Medial and lateral flaps developed. Superior and lateral breast tissue excised. Lower pole soft tissue maintained on inferiorly based dermoglandular pedicle and de epithelialized. This was utilized as Research scientist (life sciences) augmentation flap and advanced superiorly beneath pedicle and secured to chest wall with interrupted 2-0 PDS suture. Breast tailor tacked closed. Patient brought to upright sitting position and assessed for symmetry. Patient returned to supine position. Breast cavities irrigated and hemostasis obtained. Local anesthetic infiltrated throughout each breast. 15 Fr JP placed in each breast and secured with 2-0 nylon. Closure completed bilateral with 3-0 vicryl to approximate dermis along inframammary fold and vertical limb. NAC inset with 3-0 vicryl in dermis. Skin closure completed with 4-0 monocryl subcuticular throughout. Tissue adhesive applied. Dry dressing and breast binder applied.  The patient was allowed to wake from anesthesia, extubated and taken to the recovery room in satisfactory condition.   SPECIMENS: right and left breast tissue  DRAINS: 15 Fr JP in right and left breast  Glenna Fellows, MD [facility]  Office/ physician access line after hours [phone]

## 2022-11-14 NOTE — Anesthesia Procedure Notes (Signed)
Procedure Name: Intubation Date/Time: 11/14/2022 7:36 AM  Performed by: Ronnette Hila, CRNAPre-anesthesia Checklist: Patient identified, Emergency Drugs available, Suction available and Patient being monitored Patient Re-evaluated:Patient Re-evaluated prior to induction Oxygen Delivery Method: Circle system utilized Preoxygenation: Pre-oxygenation with 100% oxygen Induction Type: IV induction Ventilation: Mask ventilation without difficulty Laryngoscope Size: Mac and 3 Grade View: Grade I Tube type: Oral Tube size: 7.0 mm Number of attempts: 1 Airway Equipment and Method: Stylet and Oral airway Placement Confirmation: ETT inserted through vocal cords under direct vision, positive ETCO2 and breath sounds checked- equal and bilateral Secured at: 22 cm Tube secured with: Tape Dental Injury: Teeth and Oropharynx as per pre-operative assessment

## 2022-11-15 ENCOUNTER — Other Ambulatory Visit: Payer: Self-pay

## 2022-11-15 NOTE — Anesthesia Postprocedure Evaluation (Signed)
Anesthesia Post Note  Patient: Erika [name]  Procedure(s) Performed: MAMMARY REDUCTION  (BREAST) (Right: Breast) LEFT  ONCOPLASTIC BREAST RECONSTRUCTION (Left: Breast)     Patient location during evaluation: PACU Anesthesia Type: General Level of consciousness: awake and alert Pain management: pain level controlled Vital Signs Assessment: post-procedure vital signs reviewed and stable Respiratory status: spontaneous breathing, nonlabored ventilation, respiratory function stable and patient connected to nasal cannula oxygen Cardiovascular status: blood pressure returned to baseline and stable Postop Assessment: no apparent nausea or vomiting Anesthetic complications: no   No notable events documented.  Last Vitals:  Vitals:   11/14/22 1130 11/14/22 1147  BP: (!) 142/87 (!) 149/78  Pulse: (!) 104 (!) 110  Resp: 18 20  Temp:  (!) 36.3 C  SpO2: 96% 97%    Last Pain:  Vitals:   11/14/22 1147  TempSrc: Oral  PainSc: 3                  ,

## 2022-11-17 ENCOUNTER — Telehealth: Payer: Self-pay | Admitting: Radiation Oncology

## 2022-11-17 ENCOUNTER — Encounter (HOSPITAL_BASED_OUTPATIENT_CLINIC_OR_DEPARTMENT_OTHER): Payer: Self-pay | Admitting: Plastic Surgery

## 2022-11-17 NOTE — Telephone Encounter (Signed)
Called patient to schedule a consultation w. Dr. Roselind Messier. Patient requested appointment delay due to going out town the week of 9/8.

## 2022-11-18 NOTE — Progress Notes (Signed)
Patient Care Team: [name], MD as PCP - General (Hematology and Oncology) [name], MD as Consulting Physician (General Surgery) [name], MD as Consulting Physician (Hematology and Oncology) Antony Blackbird, MD as Consulting Physician (Radiation Oncology) Donnelly Angelica, RN as Oncology Nurse Navigator Pershing Proud, RN as Oncology Nurse Navigator  DIAGNOSIS: No diagnosis found.  SUMMARY OF ONCOLOGIC HISTORY: Oncology History  Malignant neoplasm of upper-outer quadrant of left breast in female, estrogen receptor negative (HCC)  05/15/2022 Initial Diagnosis   Palpable mass in the left breast at 1 o'clock position measured 4.2 cm ultrasound-guided biopsy revealed grade 3 IDC ER/PR negative HER2 positive Ki-67 35%, left axillary lymph node: Biopsy positive   05/21/2022 Cancer Staging   Staging form: Breast, AJCC 8th Edition - Clinical stage from 05/21/2022: Stage IIB (cT2, cN1, cM0, G3, ER-, PR-, HER2+) - Signed by [name], MD on 05/21/2022 Stage prefix: Initial diagnosis Histologic grading system: 3 grade system   05/29/2022 Genetic Testing   Negative [facility] Multi-Cancer +RNA Panel.  Report date is 05/29/2022.   The Multi-Cancer + RNA Panel offered by [facility] includes sequencing and/or deletion/duplication analysis of the following 70 genes:  AIP*, ALK, APC*, ATM*, AXIN2*, BAP1*, BARD1*, BLM*, BMPR1A*, BRCA1*, BRCA2*, BRIP1*, CDC73*, CDH1*, CDK4, CDKN1B*, CDKN2A, CHEK2*, CTNNA1*, DICER1*, EPCAM (del/dup only), EGFR, FH*, FLCN*, GREM1 (promoter dup only), HOXB13, KIT, LZTR1, MAX*, MBD4, MEN1*, MET, MITF, MLH1*, MSH2*, MSH3*, MSH6*, MUTYH*, NF1*, NF2*, NTHL1*, PALB2*, PDGFRA, PMS2*, POLD1*, POLE*, POT1*, PRKAR1A*, PTCH1*, PTEN*, RAD51C*, RAD51D*, RB1*, RET, SDHA* (sequencing only), SDHAF2*, SDHB*, SDHC*, SDHD*, SMAD4*, SMARCA4*, SMARCB1*, SMARCE1*, STK11*, SUFU*, TMEM127*, TP53*, TSC1*, TSC2*, VHL*. RNA analysis is performed for * genes.   06/05/2022 - 10/29/2022  Chemotherapy   Patient is on Treatment Plan : BREAST  Docetaxel + Carboplatin + Trastuzumab + Pertuzumab  (TCHP) q21d      11/20/2022 -  Chemotherapy   Patient is on Treatment Plan : BREAST Trastuzumab  + Pertuzumab q21d x 13 cycles       CHIEF COMPLIANT:   INTERVAL HISTORY: DHANYA DONK is a   ALLERGIES:  is allergic to penicillins.  MEDICATIONS:  Current Outpatient Medications  Medication Sig Dispense Refill   ALPRAZolam (XANAX) 0.5 MG tablet 1 TABLET ORALLY TWICE DAILY AS NEEDED 30 DAYS     aspirin EC 81 MG tablet Take 81 mg by mouth daily.     cholestyramine (QUESTRAN) 4 g packet Take 1 packet (4 g total) by mouth 3 (three) times daily with meals. 60 each 12   diphenoxylate-atropine (LOMOTIL) 2.5-0.025 MG tablet TAKE 1 TABLET BY MOUTH 4 TIMES A DAY AS NEEDED FOR DIARRHEA OR LOOSE STOOLS 30 tablet 3   lidocaine (XYLOCAINE) 2 % solution SMARTSIG:By Mouth     lisinopril (ZESTRIL) 20 MG tablet Take 20 mg by mouth daily.     loratadine (CLARITIN) 10 MG tablet Take 10 mg by mouth daily as needed for allergies.     LORazepam (ATIVAN) 0.5 MG tablet Take 1 tablet (0.5 mg total) by mouth daily as needed for anxiety. 30 tablet 0   magic mouthwash (lidocaine, diphenhydrAMINE, alum & mag hydroxide) suspension Swish and spit 5 mLs 4 (four) times daily as needed for mouth pain. 360 mL 0   omeprazole (PRILOSEC) 20 MG capsule Take by mouth.     oxyCODONE (OXY IR/ROXICODONE) 5 MG immediate release tablet Take 1 tablet (5 mg total) by mouth every 6 (six) hours as needed for severe pain. 8 tablet 0   oxyCODONE (OXY  IR/ROXICODONE) 5 MG immediate release tablet Take 1 tablet (5 mg total) by mouth every 6 (six) hours as needed for severe pain. 10 tablet 0   rosuvastatin (CRESTOR) 20 MG tablet Take 20 mg by mouth daily.     sertraline (ZOLOFT) 100 MG tablet Take 100 mg by mouth daily.     triamcinolone ointment (KENALOG) 0.5 % Apply 1 Application topically 2 (two) times daily. 30 g 2   valACYclovir  (VALTREX) 1000 MG tablet Take 1 tablet (1,000 mg total) by mouth 2 (two) times daily. 20 tablet 0   No current facility-administered medications for this visit.   Facility-Administered Medications Ordered in Other Visits  Medication Dose Route Frequency Provider Last Rate Last Admin   heparin lock flush 100 unit/mL  250 Units Intracatheter PRN [name], MD       sodium chloride flush (NS) 0.9 % injection 3 mL  3 mL Intracatheter PRN [name], MD        PHYSICAL EXAMINATION: ECOG PERFORMANCE STATUS: {CHL ONC ECOG WU:[id number]}  There were no vitals filed for this visit. There were no vitals filed for this visit.  BREAST:*** No palpable masses or nodules in either right or left breasts. No palpable axillary supraclavicular or infraclavicular adenopathy no breast tenderness or nipple discharge. (exam performed in the presence of a chaperone)  LABORATORY DATA:  I have reviewed the data as listed    Latest Ref Rng & Units 10/29/2022    8:24 AM 10/23/2022    9:23 AM 10/07/2022   11:27 AM  CMP  Glucose 70 - 99 mg/dL 956  84  83   BUN 6 - 20 mg/dL 20  15  13    Creatinine 0.44 - 1.00 mg/dL 2.13  0.86  5.78   Sodium 135 - 145 mmol/L 142  143  140   Potassium 3.5 - 5.1 mmol/L 3.7  3.7  4.0   Chloride 98 - 111 mmol/L 108  108  107   CO2 22 - 32 mmol/L 26  27  27    Calcium 8.9 - 10.3 mg/dL 9.2  9.6  8.9   Total Protein 6.5 - 8.1 g/dL 6.4  6.8  6.1   Total Bilirubin 0.3 - 1.2 mg/dL 0.3  0.3  0.3   Alkaline Phos 38 - 126 U/L 87  91  98   AST 15 - 41 U/L 24  19  17    ALT 0 - 44 U/L 25  22  19      Lab Results  Component Value Date   WBC 5.4 10/29/2022   HGB 9.7 (L) 10/29/2022   HCT 28.9 (L) 10/29/2022   MCV 102.5 (H) 10/29/2022   PLT 223 10/29/2022   NEUTROABS 2.3 10/29/2022    ASSESSMENT & PLAN:  No problem-specific Assessment & Plan notes found for this encounter.    No orders of the defined types were placed in this encounter.  The patient has a good understanding  of the overall plan. she agrees with it. she will call with any problems that may develop before the next visit here. Total time spent: 30 mins including face to face time and time spent for planning, charting and co-ordination of care   Sherlyn Lick, CMA 11/18/22    I Janan Ridge am acting as a Neurosurgeon for [employer]  ***

## 2022-11-20 ENCOUNTER — Other Ambulatory Visit: Payer: Self-pay

## 2022-11-20 ENCOUNTER — Inpatient Hospital Stay: Payer: 59

## 2022-11-20 ENCOUNTER — Inpatient Hospital Stay (HOSPITAL_BASED_OUTPATIENT_CLINIC_OR_DEPARTMENT_OTHER): Payer: 59 | Admitting: Hematology and Oncology

## 2022-11-20 VITALS — BP 119/71 | HR 73 | Temp 97.8°F | Resp 18 | Ht 68.0 in | Wt 162.4 lb

## 2022-11-20 VITALS — BP 116/74 | HR 67

## 2022-11-20 DIAGNOSIS — Z79624 Long term (current) use of inhibitors of nucleotide synthesis: Secondary | ICD-10-CM | POA: Diagnosis not present

## 2022-11-20 DIAGNOSIS — C50412 Malignant neoplasm of upper-outer quadrant of left female breast: Secondary | ICD-10-CM

## 2022-11-20 DIAGNOSIS — Z79899 Other long term (current) drug therapy: Secondary | ICD-10-CM | POA: Diagnosis not present

## 2022-11-20 DIAGNOSIS — I427 Cardiomyopathy due to drug and external agent: Secondary | ICD-10-CM

## 2022-11-20 DIAGNOSIS — Z95828 Presence of other vascular implants and grafts: Secondary | ICD-10-CM

## 2022-11-20 DIAGNOSIS — Z171 Estrogen receptor negative status [ER-]: Secondary | ICD-10-CM

## 2022-11-20 DIAGNOSIS — Z7982 Long term (current) use of aspirin: Secondary | ICD-10-CM | POA: Diagnosis not present

## 2022-11-20 DIAGNOSIS — Z5112 Encounter for antineoplastic immunotherapy: Secondary | ICD-10-CM | POA: Diagnosis present

## 2022-11-20 LAB — CBC WITH DIFFERENTIAL (CANCER CENTER ONLY)
Abs Immature Granulocytes: 0.01 10*3/uL (ref 0.00–0.07)
Basophils Absolute: 0 10*3/uL (ref 0.0–0.1)
Basophils Relative: 0 %
Eosinophils Absolute: 1 10*3/uL — ABNORMAL HIGH (ref 0.0–0.5)
Eosinophils Relative: 14 %
HCT: 26.8 % — ABNORMAL LOW (ref 36.0–46.0)
Hemoglobin: 9.1 g/dL — ABNORMAL LOW (ref 12.0–15.0)
Immature Granulocytes: 0 %
Lymphocytes Relative: 34 %
Lymphs Abs: 2.3 10*3/uL (ref 0.7–4.0)
MCH: 34.7 pg — ABNORMAL HIGH (ref 26.0–34.0)
MCHC: 34 g/dL (ref 30.0–36.0)
MCV: 102.3 fL — ABNORMAL HIGH (ref 80.0–100.0)
Monocytes Absolute: 0.5 10*3/uL (ref 0.1–1.0)
Monocytes Relative: 7 %
Neutro Abs: 3.1 10*3/uL (ref 1.7–7.7)
Neutrophils Relative %: 45 %
Platelet Count: 215 10*3/uL (ref 150–400)
RBC: 2.62 MIL/uL — ABNORMAL LOW (ref 3.87–5.11)
RDW: 17.2 % — ABNORMAL HIGH (ref 11.5–15.5)
WBC Count: 6.9 10*3/uL (ref 4.0–10.5)
nRBC: 0 % (ref 0.0–0.2)

## 2022-11-20 LAB — CMP (CANCER CENTER ONLY)
ALT: 15 U/L (ref 0–44)
AST: 15 U/L (ref 15–41)
Albumin: 3.8 g/dL (ref 3.5–5.0)
Alkaline Phosphatase: 85 U/L (ref 38–126)
Anion gap: 6 (ref 5–15)
BUN: 16 mg/dL (ref 6–20)
CO2: 29 mmol/L (ref 22–32)
Calcium: 8.8 mg/dL — ABNORMAL LOW (ref 8.9–10.3)
Chloride: 106 mmol/L (ref 98–111)
Creatinine: 0.88 mg/dL (ref 0.44–1.00)
GFR, Estimated: >60 mL/min (ref >60)
Glucose, Bld: 112 mg/dL — ABNORMAL HIGH (ref 70–99)
Potassium: 3.6 mmol/L (ref 3.5–5.1)
Sodium: 141 mmol/L (ref 135–145)
Total Bilirubin: 0.4 mg/dL (ref 0.3–1.2)
Total Protein: 6.3 g/dL — ABNORMAL LOW (ref 6.5–8.1)

## 2022-11-20 MED ORDER — HEPARIN SOD (PORK) LOCK FLUSH 100 UNIT/ML IV SOLN
500.0000 [IU] | Freq: Once | INTRAVENOUS | Status: AC | PRN
  Administered 2022-11-20: 500 [IU]

## 2022-11-20 MED ORDER — DIPHENHYDRAMINE HCL 25 MG PO CAPS
25.0000 mg | ORAL_CAPSULE | Freq: Once | ORAL | Status: AC
  Administered 2022-11-20: 25 mg via ORAL
  Filled 2022-11-20: qty 1

## 2022-11-20 MED ORDER — SODIUM CHLORIDE 0.9% FLUSH
10.0000 mL | INTRAVENOUS | Status: DC | PRN
  Administered 2022-11-20: 10 mL

## 2022-11-20 MED ORDER — ACETAMINOPHEN 325 MG PO TABS
650.0000 mg | ORAL_TABLET | Freq: Once | ORAL | Status: AC
  Administered 2022-11-20: 650 mg via ORAL
  Filled 2022-11-20: qty 2

## 2022-11-20 MED ORDER — SODIUM CHLORIDE 0.9 % IV SOLN
420.0000 mg | Freq: Once | INTRAVENOUS | Status: AC
  Administered 2022-11-20: 420 mg via INTRAVENOUS
  Filled 2022-11-20: qty 14

## 2022-11-20 MED ORDER — TRASTUZUMAB-ANNS CHEMO 150 MG IV SOLR
6.0000 mg/kg | Freq: Once | INTRAVENOUS | Status: AC
  Administered 2022-11-20: 420 mg via INTRAVENOUS
  Filled 2022-11-20: qty 20

## 2022-11-20 MED ORDER — SODIUM CHLORIDE 0.9% FLUSH
10.0000 mL | Freq: Once | INTRAVENOUS | Status: AC
  Administered 2022-11-20: 10 mL

## 2022-11-20 MED ORDER — GABAPENTIN 300 MG PO CAPS: 300.0000 mg | ORAL_CAPSULE | Freq: Three times a day (TID) | ORAL | 1 refills | Status: DC

## 2022-11-20 MED ORDER — SODIUM CHLORIDE 0.9 % IV SOLN: Freq: Once | INTRAVENOUS | Status: AC

## 2022-11-20 NOTE — Patient Instructions (Signed)
[facility]  Discharge Instructions: Thank you for choosing [facility] to provide your oncology and hematology care.   If you have a lab appointment with the Cancer Center, please go directly to the Cancer Center and check in at the registration area.   Wear comfortable clothing and clothing appropriate for easy access to any Portacath or PICC line.   We strive to give you quality time with your provider. You may need to reschedule your appointment if you arrive late (15 or more minutes).  Arriving late affects you and other patients whose appointments are after yours.  Also, if you miss three or more appointments without notifying the office, you may be dismissed from the clinic at the provider's discretion.      For prescription refill requests, have your pharmacy contact our office and allow 72 hours for refills to be completed.    Today you received the following chemotherapy and/or immunotherapy agents: Trastuzumab, Pertuzumab.      To help prevent nausea and vomiting after your treatment, we encourage you to take your nausea medication as directed.  BELOW ARE SYMPTOMS THAT SHOULD BE REPORTED IMMEDIATELY: *FEVER GREATER THAN 100.4 F (38 C) OR HIGHER *CHILLS OR SWEATING *NAUSEA AND VOMITING THAT IS NOT CONTROLLED WITH YOUR NAUSEA MEDICATION *UNUSUAL SHORTNESS OF BREATH *UNUSUAL BRUISING OR BLEEDING *URINARY PROBLEMS (pain or burning when urinating, or frequent urination) *BOWEL PROBLEMS (unusual diarrhea, constipation, pain near the anus) TENDERNESS IN MOUTH AND THROAT WITH OR WITHOUT PRESENCE OF ULCERS (sore throat, sores in mouth, or a toothache) UNUSUAL RASH, SWELLING OR PAIN  UNUSUAL VAGINAL DISCHARGE OR ITCHING   Items with * indicate a potential emergency and should be followed up as soon as possible or go to the Emergency Department if any problems should occur.  Please show the CHEMOTHERAPY ALERT CARD or IMMUNOTHERAPY  ALERT CARD at check-in to the Emergency Department and triage nurse.  Should you have questions after your visit or need to cancel or reschedule your appointment, please contact [facility]  Dept: [phone]  and follow the prompts.  Office hours are 8:00 a.m. to 4:30 p.m. Monday - Friday. Please note that voicemails left after 4:00 p.m. may not be returned until the following business day.  We are closed weekends and major holidays. You have access to a nurse at all times for urgent questions. Please call the main number to the clinic Dept: [phone] and follow the prompts.   For any non-urgent questions, you may also contact your provider using MyChart. We now offer e-Visits for anyone 18 and older to request care online for non-urgent symptoms. For details visit [email or web address].   Also download the MyChart app! Go to the app store, search "MyChart", open the app, select [facility], and log in with your MyChart username and password.  

## 2022-11-20 NOTE — Assessment & Plan Note (Signed)
stage IIB ER/PR negative, HER2 positive breast cancer here today for evaluation and follow-up prior to receiving cycle 6 of neoadjuvant Abraxane, carboplatin, Herceptin, and Perjeta.   Treatment plan: 1. Neoadjuvant chemotherapy with TCH Perjeta 6 cycles completed 09/18/2022 (Taxotere switched with Abraxane) followed by Herceptin Perjeta maintenance versus Kadcyla maintenance (based on response to neoadjuvant chemo) for 1 year 2. Followed by breast conserving surgery if possible with sentinel lymph node study 3. 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   11/04/2022: Left lumpectomy: No residual invasive cancer identified.  0/4 lymph nodes negative pathologic complete response  She has a vacation coming up at [facility] and hence we will postpone 9/5 treatment till the following week Return to clinic every 3 weeks for Herceptin Perjeta and every 6 weeks for follow-up with me.

## 2022-11-20 NOTE — Progress Notes (Signed)
Patient declined 30 min post observation.  VSS at discharge.  Tolerated treatment well without incident.

## 2022-11-21 ENCOUNTER — Encounter: Payer: Self-pay | Admitting: *Deleted

## 2022-11-22 ENCOUNTER — Other Ambulatory Visit: Payer: Self-pay

## 2022-11-28 ENCOUNTER — Telehealth: Payer: Self-pay | Admitting: Hematology and Oncology

## 2022-11-28 ENCOUNTER — Telehealth: Payer: Self-pay

## 2022-11-28 NOTE — Telephone Encounter (Signed)
Rescheduled appointments per 8/23 staff message. Patient is aware of the changes made to her upcoming appointments.

## 2022-11-28 NOTE — Telephone Encounter (Signed)
Pt LVM with concerns for itching on hands and elbows after taking gabapentin. Pt took 25 mg Benadryl after this occurred. This RN instructed pt to stop taking the gabapentin and recommended pt to take Benadryl as instructed by the bottle if it is still occurring later. Pt denied any bumps, rashes, or any other symptoms occurring at this time. Pt stated that the Benadryl is helping. This RN offered pt an appt to f/u about replacing the gabapentin in order to help with nerve pain. She insisted she felt the nerve pain was better as she described she has a "lemon-sized" pocket of fluid drained from under her arm yesterday and states she feels that the pain is tolerable without prescription medication., as she stated she feels she is allergic to a lot of prescription medication.   This RN advised patient to call back Monday to schedule an appointment if she feels she needs to f/u with Dr. Pamelia Hoit about a better alternative. Pt stated an understanding and stated that she would call to schedule an appointment if needed before her next appointment.

## 2022-11-29 ENCOUNTER — Encounter: Payer: Self-pay | Admitting: Hematology and Oncology

## 2022-12-10 ENCOUNTER — Other Ambulatory Visit: Payer: Self-pay | Admitting: *Deleted

## 2022-12-10 DIAGNOSIS — Z171 Estrogen receptor negative status [ER-]: Secondary | ICD-10-CM

## 2022-12-10 MED ORDER — VALACYCLOVIR HCL 1 G PO TABS
1000.0000 mg | ORAL_TABLET | Freq: Two times a day (BID) | ORAL | 0 refills | Status: DC
Start: 2022-12-10 — End: 2023-04-23

## 2022-12-10 NOTE — Telephone Encounter (Signed)
This RN spoke with pt per her call [place] and is having an outbreak of a fever blister on her lip. She did not bring her Valtrex with her.  Local pharmacy located and this RN sent refill of Valtrex.  No further needs at this time.

## 2022-12-12 ENCOUNTER — Other Ambulatory Visit: Payer: Self-pay

## 2022-12-12 ENCOUNTER — Encounter: Payer: Self-pay | Admitting: Hematology and Oncology

## 2022-12-12 MED ORDER — METHYLPREDNISOLONE 4 MG PO TBPK: ORAL_TABLET | ORAL | 0 refills | Status: DC

## 2022-12-12 MED ORDER — MAGIC MOUTHWASH W/LIDOCAINE: 5.0000 mL | Freq: Four times a day (QID) | ORAL | 0 refills | Status: DC | PRN

## 2022-12-15 ENCOUNTER — Ambulatory Visit: Payer: 59

## 2022-12-15 ENCOUNTER — Telehealth: Payer: Self-pay | Admitting: *Deleted

## 2022-12-15 ENCOUNTER — Inpatient Hospital Stay: Payer: 59

## 2022-12-15 ENCOUNTER — Other Ambulatory Visit: Payer: 59

## 2022-12-15 ENCOUNTER — Inpatient Hospital Stay: Payer: 59 | Attending: Hematology and Oncology

## 2022-12-15 VITALS — BP 145/88 | HR 60 | Temp 98.2°F | Resp 13 | Wt 161.0 lb

## 2022-12-15 DIAGNOSIS — C50412 Malignant neoplasm of upper-outer quadrant of left female breast: Secondary | ICD-10-CM

## 2022-12-15 DIAGNOSIS — Z171 Estrogen receptor negative status [ER-]: Secondary | ICD-10-CM | POA: Insufficient documentation

## 2022-12-15 DIAGNOSIS — Z5112 Encounter for antineoplastic immunotherapy: Secondary | ICD-10-CM | POA: Insufficient documentation

## 2022-12-15 DIAGNOSIS — Z95828 Presence of other vascular implants and grafts: Secondary | ICD-10-CM

## 2022-12-15 LAB — CBC WITH DIFFERENTIAL (CANCER CENTER ONLY)
Abs Immature Granulocytes: 0.02 10*3/uL (ref 0.00–0.07)
Basophils Absolute: 0 10*3/uL (ref 0.0–0.1)
Basophils Relative: 0 %
Eosinophils Absolute: 0.1 10*3/uL (ref 0.0–0.5)
Eosinophils Relative: 1 %
HCT: 30.6 % — ABNORMAL LOW (ref 36.0–46.0)
Hemoglobin: 9.9 g/dL — ABNORMAL LOW (ref 12.0–15.0)
Immature Granulocytes: 0 %
Lymphocytes Relative: 35 %
Lymphs Abs: 3.5 10*3/uL (ref 0.7–4.0)
MCH: 33.4 pg (ref 26.0–34.0)
MCHC: 32.4 g/dL (ref 30.0–36.0)
MCV: 103.4 fL — ABNORMAL HIGH (ref 80.0–100.0)
Monocytes Absolute: 0.8 10*3/uL (ref 0.1–1.0)
Monocytes Relative: 8 %
Neutro Abs: 5.7 10*3/uL (ref 1.7–7.7)
Neutrophils Relative %: 56 %
Platelet Count: 213 10*3/uL (ref 150–400)
RBC: 2.96 MIL/uL — ABNORMAL LOW (ref 3.87–5.11)
RDW: 14.7 % (ref 11.5–15.5)
WBC Count: 10 10*3/uL (ref 4.0–10.5)
nRBC: 0 % (ref 0.0–0.2)

## 2022-12-15 LAB — CMP (CANCER CENTER ONLY)
ALT: 18 U/L (ref 0–44)
AST: 18 U/L (ref 15–41)
Albumin: 4 g/dL (ref 3.5–5.0)
Alkaline Phosphatase: 71 U/L (ref 38–126)
Anion gap: 6 (ref 5–15)
BUN: 25 mg/dL — ABNORMAL HIGH (ref 6–20)
CO2: 27 mmol/L (ref 22–32)
Calcium: 9.5 mg/dL (ref 8.9–10.3)
Chloride: 109 mmol/L (ref 98–111)
Creatinine: 0.9 mg/dL (ref 0.44–1.00)
GFR, Estimated: >60 mL/min (ref >60)
Glucose, Bld: 98 mg/dL (ref 70–99)
Potassium: 3.7 mmol/L (ref 3.5–5.1)
Sodium: 142 mmol/L (ref 135–145)
Total Bilirubin: 0.3 mg/dL (ref 0.3–1.2)
Total Protein: 6.6 g/dL (ref 6.5–8.1)

## 2022-12-15 MED ORDER — ACETAMINOPHEN 325 MG PO TABS
650.0000 mg | ORAL_TABLET | Freq: Once | ORAL | Status: AC
  Administered 2022-12-15: 650 mg via ORAL
  Filled 2022-12-15: qty 2

## 2022-12-15 MED ORDER — DIPHENHYDRAMINE HCL 25 MG PO CAPS
25.0000 mg | ORAL_CAPSULE | Freq: Once | ORAL | Status: AC
  Administered 2022-12-15: 25 mg via ORAL
  Filled 2022-12-15: qty 1

## 2022-12-15 MED ORDER — TRASTUZUMAB-ANNS CHEMO 150 MG IV SOLR
6.0000 mg/kg | Freq: Once | INTRAVENOUS | Status: AC
  Administered 2022-12-15: 420 mg via INTRAVENOUS
  Filled 2022-12-15: qty 20

## 2022-12-15 MED ORDER — SODIUM CHLORIDE 0.9% FLUSH
10.0000 mL | INTRAVENOUS | Status: DC | PRN
  Administered 2022-12-15: 10 mL

## 2022-12-15 MED ORDER — HEPARIN SOD (PORK) LOCK FLUSH 100 UNIT/ML IV SOLN
500.0000 [IU] | Freq: Once | INTRAVENOUS | Status: AC | PRN
  Administered 2022-12-15: 500 [IU]

## 2022-12-15 MED ORDER — SODIUM CHLORIDE 0.9% FLUSH
10.0000 mL | Freq: Once | INTRAVENOUS | Status: AC
  Administered 2022-12-15: 10 mL

## 2022-12-15 MED ORDER — SODIUM CHLORIDE 0.9 % IV SOLN
420.0000 mg | Freq: Once | INTRAVENOUS | Status: AC
  Administered 2022-12-15: 420 mg via INTRAVENOUS
  Filled 2022-12-15: qty 14

## 2022-12-15 MED ORDER — SODIUM CHLORIDE 0.9 % IV SOLN: Freq: Once | INTRAVENOUS | Status: AC

## 2022-12-15 NOTE — Progress Notes (Signed)
Patient declined 30 min post pertuzumab observation.  Tolerated treatment well without incident.  VSS at discharge.  Ambulated to lobby.

## 2022-12-15 NOTE — Patient Instructions (Signed)
[facility]  Discharge Instructions: Thank you for choosing [facility] to provide your oncology and hematology care.   If you have a lab appointment with the Cancer Center, please go directly to the Cancer Center and check in at the registration area.   Wear comfortable clothing and clothing appropriate for easy access to any Portacath or PICC line.   We strive to give you quality time with your provider. You may need to reschedule your appointment if you arrive late (15 or more minutes).  Arriving late affects you and other patients whose appointments are after yours.  Also, if you miss three or more appointments without notifying the office, you may be dismissed from the clinic at the provider's discretion.      For prescription refill requests, have your pharmacy contact our office and allow 72 hours for refills to be completed.    Today you received the following chemotherapy and/or immunotherapy agents: Trastuzumab, Pertuzumab.      To help prevent nausea and vomiting after your treatment, we encourage you to take your nausea medication as directed.  BELOW ARE SYMPTOMS THAT SHOULD BE REPORTED IMMEDIATELY: *FEVER GREATER THAN 100.4 F (38 C) OR HIGHER *CHILLS OR SWEATING *NAUSEA AND VOMITING THAT IS NOT CONTROLLED WITH YOUR NAUSEA MEDICATION *UNUSUAL SHORTNESS OF BREATH *UNUSUAL BRUISING OR BLEEDING *URINARY PROBLEMS (pain or burning when urinating, or frequent urination) *BOWEL PROBLEMS (unusual diarrhea, constipation, pain near the anus) TENDERNESS IN MOUTH AND THROAT WITH OR WITHOUT PRESENCE OF ULCERS (sore throat, sores in mouth, or a toothache) UNUSUAL RASH, SWELLING OR PAIN  UNUSUAL VAGINAL DISCHARGE OR ITCHING   Items with * indicate a potential emergency and should be followed up as soon as possible or go to the Emergency Department if any problems should occur.  Please show the CHEMOTHERAPY ALERT CARD or IMMUNOTHERAPY  ALERT CARD at check-in to the Emergency Department and triage nurse.  Should you have questions after your visit or need to cancel or reschedule your appointment, please contact [facility]  Dept: [phone]  and follow the prompts.  Office hours are 8:00 a.m. to 4:30 p.m. Monday - Friday. Please note that voicemails left after 4:00 p.m. may not be returned until the following business day.  We are closed weekends and major holidays. You have access to a nurse at all times for urgent questions. Please call the main number to the clinic Dept: [phone] and follow the prompts.   For any non-urgent questions, you may also contact your provider using MyChart. We now offer e-Visits for anyone 18 and older to request care online for non-urgent symptoms. For details visit [email or web address].   Also download the MyChart app! Go to the app store, search "MyChart", open the app, select [facility], and log in with your MyChart username and password.  

## 2022-12-15 NOTE — Telephone Encounter (Signed)
Pt here for chemo with last echo 5/202/2024 with stable reading - need ok to proceed with treatment today- with echo scheduled for next week.  Proceed with treatment today.

## 2022-12-17 ENCOUNTER — Ambulatory Visit: Payer: 59 | Admitting: Radiation Oncology

## 2022-12-17 ENCOUNTER — Ambulatory Visit: Payer: 59

## 2022-12-23 ENCOUNTER — Other Ambulatory Visit: Payer: Self-pay

## 2022-12-23 ENCOUNTER — Ambulatory Visit (HOSPITAL_COMMUNITY): Payer: 59

## 2022-12-23 ENCOUNTER — Encounter: Payer: Self-pay | Admitting: *Deleted

## 2022-12-23 ENCOUNTER — Ambulatory Visit: Payer: 59 | Admitting: Rehabilitation

## 2022-12-29 ENCOUNTER — Other Ambulatory Visit: Payer: Self-pay | Admitting: General Surgery

## 2022-12-29 ENCOUNTER — Ambulatory Visit (HOSPITAL_COMMUNITY): Payer: 59

## 2022-12-29 DIAGNOSIS — C50412 Malignant neoplasm of upper-outer quadrant of left female breast: Secondary | ICD-10-CM

## 2022-12-31 ENCOUNTER — Ambulatory Visit: Payer: 59 | Admitting: Rehabilitation

## 2022-12-31 ENCOUNTER — Ambulatory Visit: Payer: 59 | Admitting: Radiation Oncology

## 2022-12-31 ENCOUNTER — Ambulatory Visit: Payer: 59

## 2023-01-01 ENCOUNTER — Encounter: Payer: Self-pay | Admitting: General Surgery

## 2023-01-05 ENCOUNTER — Other Ambulatory Visit: Payer: 59

## 2023-01-05 ENCOUNTER — Ambulatory Visit (HOSPITAL_COMMUNITY)
Admission: RE | Admit: 2023-01-05 | Discharge: 2023-01-05 | Disposition: A | Discharge status: Home / Self Care | Payer: 59 | Source: Ambulatory Visit | Attending: Hematology and Oncology | Admitting: Hematology and Oncology

## 2023-01-05 DIAGNOSIS — T451X5A Adverse effect of antineoplastic and immunosuppressive drugs, initial encounter: Secondary | ICD-10-CM | POA: Insufficient documentation

## 2023-01-05 DIAGNOSIS — I427 Cardiomyopathy due to drug and external agent: Secondary | ICD-10-CM | POA: Diagnosis present

## 2023-01-05 DIAGNOSIS — C50412 Malignant neoplasm of upper-outer quadrant of left female breast: Secondary | ICD-10-CM | POA: Diagnosis present

## 2023-01-05 DIAGNOSIS — Z171 Estrogen receptor negative status [ER-]: Secondary | ICD-10-CM | POA: Insufficient documentation

## 2023-01-05 LAB — ECHOCARDIOGRAM COMPLETE
AR max vel: 2.51 cm2
AV Area VTI: 2.32 cm2
AV Area mean vel: 2.38 cm2
AV Mean grad: 5 mm[Hg]
AV Peak grad: 9.4 mm[Hg]
Ao pk vel: 1.53 m/s
Area-P 1/2: 4.15 cm2
P 1/2 time: 654 ms
S' Lateral: 2.4 cm

## 2023-01-05 NOTE — Progress Notes (Signed)
*  PRELIMINARY RESULTS* Echocardiogram 2D Echocardiogram has been performed.  Erika [name] 01/05/2023, 11:37 AM

## 2023-01-06 ENCOUNTER — Inpatient Hospital Stay: Payer: 59

## 2023-01-06 ENCOUNTER — Inpatient Hospital Stay (HOSPITAL_BASED_OUTPATIENT_CLINIC_OR_DEPARTMENT_OTHER): Payer: 59 | Admitting: Hematology and Oncology

## 2023-01-06 ENCOUNTER — Encounter: Payer: Self-pay | Admitting: *Deleted

## 2023-01-06 ENCOUNTER — Other Ambulatory Visit: Payer: Self-pay

## 2023-01-06 VITALS — BP 122/78 | HR 66 | Resp 16

## 2023-01-06 VITALS — BP 119/70 | HR 77 | Temp 97.3°F | Resp 18 | Ht 68.0 in | Wt 162.6 lb

## 2023-01-06 DIAGNOSIS — Z5112 Encounter for antineoplastic immunotherapy: Secondary | ICD-10-CM | POA: Insufficient documentation

## 2023-01-06 DIAGNOSIS — Z171 Estrogen receptor negative status [ER-]: Secondary | ICD-10-CM | POA: Insufficient documentation

## 2023-01-06 DIAGNOSIS — C50412 Malignant neoplasm of upper-outer quadrant of left female breast: Secondary | ICD-10-CM | POA: Insufficient documentation

## 2023-01-06 DIAGNOSIS — Z95828 Presence of other vascular implants and grafts: Secondary | ICD-10-CM

## 2023-01-06 LAB — CBC WITH DIFFERENTIAL (CANCER CENTER ONLY)
Abs Immature Granulocytes: 0.01 10*3/uL (ref 0.00–0.07)
Basophils Absolute: 0 10*3/uL (ref 0.0–0.1)
Basophils Relative: 1 %
Eosinophils Absolute: 0.2 10*3/uL (ref 0.0–0.5)
Eosinophils Relative: 3 %
HCT: 31.5 % — ABNORMAL LOW (ref 36.0–46.0)
Hemoglobin: 10.4 g/dL — ABNORMAL LOW (ref 12.0–15.0)
Immature Granulocytes: 0 %
Lymphocytes Relative: 40 %
Lymphs Abs: 2.5 10*3/uL (ref 0.7–4.0)
MCH: 33.1 pg (ref 26.0–34.0)
MCHC: 33 g/dL (ref 30.0–36.0)
MCV: 100.3 fL — ABNORMAL HIGH (ref 80.0–100.0)
Monocytes Absolute: 0.6 10*3/uL (ref 0.1–1.0)
Monocytes Relative: 9 %
Neutro Abs: 2.9 10*3/uL (ref 1.7–7.7)
Neutrophils Relative %: 47 %
Platelet Count: 235 10*3/uL (ref 150–400)
RBC: 3.14 MIL/uL — ABNORMAL LOW (ref 3.87–5.11)
RDW: 13.9 % (ref 11.5–15.5)
WBC Count: 6.2 10*3/uL (ref 4.0–10.5)
nRBC: 0 % (ref 0.0–0.2)

## 2023-01-06 LAB — CMP (CANCER CENTER ONLY)
ALT: 18 U/L (ref 0–44)
AST: 17 U/L (ref 15–41)
Albumin: 4 g/dL (ref 3.5–5.0)
Alkaline Phosphatase: 73 U/L (ref 38–126)
Anion gap: 6 (ref 5–15)
BUN: 24 mg/dL — ABNORMAL HIGH (ref 6–20)
CO2: 28 mmol/L (ref 22–32)
Calcium: 9.3 mg/dL (ref 8.9–10.3)
Chloride: 108 mmol/L (ref 98–111)
Creatinine: 0.87 mg/dL (ref 0.44–1.00)
GFR, Estimated: >60 mL/min (ref >60)
Glucose, Bld: 112 mg/dL — ABNORMAL HIGH (ref 70–99)
Potassium: 3.9 mmol/L (ref 3.5–5.1)
Sodium: 142 mmol/L (ref 135–145)
Total Bilirubin: 0.3 mg/dL (ref 0.3–1.2)
Total Protein: 6.5 g/dL (ref 6.5–8.1)

## 2023-01-06 MED ORDER — SODIUM CHLORIDE 0.9% FLUSH
10.0000 mL | INTRAVENOUS | Status: DC | PRN
  Administered 2023-01-06: 10 mL

## 2023-01-06 MED ORDER — SODIUM CHLORIDE 0.9 % IV SOLN
420.0000 mg | Freq: Once | INTRAVENOUS | Status: AC
  Administered 2023-01-06: 420 mg via INTRAVENOUS
  Filled 2023-01-06: qty 14

## 2023-01-06 MED ORDER — SODIUM CHLORIDE 0.9% FLUSH
10.0000 mL | Freq: Once | INTRAVENOUS | Status: AC
  Administered 2023-01-06: 10 mL

## 2023-01-06 MED ORDER — HEPARIN SOD (PORK) LOCK FLUSH 100 UNIT/ML IV SOLN
500.0000 [IU] | Freq: Once | INTRAVENOUS | Status: AC | PRN
  Administered 2023-01-06: 500 [IU]

## 2023-01-06 MED ORDER — AMOXICILLIN-POT CLAVULANATE 875-125 MG PO TABS: 1.0000 | ORAL_TABLET | Freq: Two times a day (BID) | ORAL | 0 refills | Status: DC

## 2023-01-06 MED ORDER — ACETAMINOPHEN 325 MG PO TABS
650.0000 mg | ORAL_TABLET | Freq: Once | ORAL | Status: AC
  Administered 2023-01-06: 650 mg via ORAL
  Filled 2023-01-06: qty 2

## 2023-01-06 MED ORDER — DIPHENHYDRAMINE HCL 25 MG PO CAPS
25.0000 mg | ORAL_CAPSULE | Freq: Once | ORAL | Status: AC
  Administered 2023-01-06: 25 mg via ORAL
  Filled 2023-01-06: qty 1

## 2023-01-06 MED ORDER — SODIUM CHLORIDE 0.9 % IV SOLN: Freq: Once | INTRAVENOUS | Status: AC

## 2023-01-06 MED ORDER — TRASTUZUMAB-ANNS CHEMO 150 MG IV SOLR
6.0000 mg/kg | Freq: Once | INTRAVENOUS | Status: AC
  Administered 2023-01-06: 420 mg via INTRAVENOUS
  Filled 2023-01-06: qty 20

## 2023-01-06 NOTE — Patient Instructions (Signed)
[facility]  Discharge Instructions: Thank you for choosing [facility] to provide your oncology and hematology care.   If you have a lab appointment with the Cancer Center, please go directly to the Cancer Center and check in at the registration area.   Wear comfortable clothing and clothing appropriate for easy access to any Portacath or PICC line.   We strive to give you quality time with your provider. You may need to reschedule your appointment if you arrive late (15 or more minutes).  Arriving late affects you and other patients whose appointments are after yours.  Also, if you miss three or more appointments without notifying the office, you may be dismissed from the clinic at the provider's discretion.      For prescription refill requests, have your pharmacy contact our office and allow 72 hours for refills to be completed.    Today you received the following chemotherapy and/or immunotherapy agents: Kanjinti/Perjeta   To help prevent nausea and vomiting after your treatment, we encourage you to take your nausea medication as directed.  BELOW ARE SYMPTOMS THAT SHOULD BE REPORTED IMMEDIATELY: *FEVER GREATER THAN 100.4 F (38 C) OR HIGHER *CHILLS OR SWEATING *NAUSEA AND VOMITING THAT IS NOT CONTROLLED WITH YOUR NAUSEA MEDICATION *UNUSUAL SHORTNESS OF BREATH *UNUSUAL BRUISING OR BLEEDING *URINARY PROBLEMS (pain or burning when urinating, or frequent urination) *BOWEL PROBLEMS (unusual diarrhea, constipation, pain near the anus) TENDERNESS IN MOUTH AND THROAT WITH OR WITHOUT PRESENCE OF ULCERS (sore throat, sores in mouth, or a toothache) UNUSUAL RASH, SWELLING OR PAIN  UNUSUAL VAGINAL DISCHARGE OR ITCHING   Items with * indicate a potential emergency and should be followed up as soon as possible or go to the Emergency Department if any problems should occur.  Please show the CHEMOTHERAPY ALERT CARD or IMMUNOTHERAPY ALERT CARD at  check-in to the Emergency Department and triage nurse.  Should you have questions after your visit or need to cancel or reschedule your appointment, please contact [facility]  Dept: [phone]  and follow the prompts.  Office hours are 8:00 a.m. to 4:30 p.m. Monday - Friday. Please note that voicemails left after 4:00 p.m. may not be returned until the following business day.  We are closed weekends and major holidays. You have access to a nurse at all times for urgent questions. Please call the main number to the clinic Dept: [phone] and follow the prompts.   For any non-urgent questions, you may also contact your provider using MyChart. We now offer e-Visits for anyone 18 and older to request care online for non-urgent symptoms. For details visit [email or web address].   Also download the MyChart app! Go to the app store, search "MyChart", open the app, select [facility], and log in with your MyChart username and password.   

## 2023-01-06 NOTE — Patient Instructions (Signed)

## 2023-01-07 ENCOUNTER — Encounter: Payer: Self-pay | Admitting: Hematology and Oncology

## 2023-01-07 NOTE — Assessment & Plan Note (Signed)
stage IIB ER/PR negative, HER2 positive breast cancer here today for evaluation and follow-up prior to receiving cycle 6 of neoadjuvant Abraxane, carboplatin, Herceptin, and Perjeta.   Treatment plan: 1. Neoadjuvant chemotherapy with TCH Perjeta 6 cycles completed 09/18/2022 (Taxotere switched with Abraxane) followed by Herceptin Perjeta maintenance versus Kadcyla maintenance (based on response to neoadjuvant chemo) for 1 year 2. 11/04/2022: Left lumpectomy: No residual invasive cancer identified.  0/4 lymph nodes negative pathologic complete response 3. 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

## 2023-01-07 NOTE — Progress Notes (Signed)
Patient Care Team: [name], PA-C as PCP - General (Family Medicine) [name], MD as Consulting Physician (General Surgery) [name], MD as Consulting Physician (Hematology and Oncology) Antony Blackbird, MD as Consulting Physician (Radiation Oncology) Donnelly Angelica, RN as Oncology Nurse Navigator Pershing Proud, RN as Oncology Nurse Navigator  DIAGNOSIS:  Encounter Diagnosis  Name Primary?   Malignant neoplasm of upper-outer quadrant of left breast in female, estrogen receptor negative (HCC) Yes    SUMMARY OF ONCOLOGIC HISTORY: Oncology History  Malignant neoplasm of upper-outer quadrant of left breast in female, estrogen receptor negative (HCC)  05/15/2022 Initial Diagnosis   Palpable mass in the left breast at 1 o'clock position measured 4.2 cm ultrasound-guided biopsy revealed grade 3 IDC ER/PR negative HER2 positive Ki-67 35%, left axillary lymph node: Biopsy positive   05/21/2022 Cancer Staging   Staging form: Breast, AJCC 8th Edition - Clinical stage from 05/21/2022: Stage IIB (cT2, cN1, cM0, G3, ER-, PR-, HER2+) - Signed by [name], MD on 05/21/2022 Stage prefix: Initial diagnosis Histologic grading system: 3 grade system   05/29/2022 Genetic Testing   Negative [facility] Multi-Cancer +RNA Panel.  Report date is 05/29/2022.   The Multi-Cancer + RNA Panel offered by [facility] includes sequencing and/or deletion/duplication analysis of the following 70 genes:  AIP*, ALK, APC*, ATM*, AXIN2*, BAP1*, BARD1*, BLM*, BMPR1A*, BRCA1*, BRCA2*, BRIP1*, CDC73*, CDH1*, CDK4, CDKN1B*, CDKN2A, CHEK2*, CTNNA1*, DICER1*, EPCAM (del/dup only), EGFR, FH*, FLCN*, GREM1 (promoter dup only), HOXB13, KIT, LZTR1, MAX*, MBD4, MEN1*, MET, MITF, MLH1*, MSH2*, MSH3*, MSH6*, MUTYH*, NF1*, NF2*, NTHL1*, PALB2*, PDGFRA, PMS2*, POLD1*, POLE*, POT1*, PRKAR1A*, PTCH1*, PTEN*, RAD51C*, RAD51D*, RB1*, RET, SDHA* (sequencing only), SDHAF2*, SDHB*, SDHC*, SDHD*, SMAD4*, SMARCA4*, SMARCB1*, SMARCE1*,  STK11*, SUFU*, TMEM127*, TP53*, TSC1*, TSC2*, VHL*. RNA analysis is performed for * genes.   06/05/2022 - 10/29/2022 Chemotherapy   Patient is on Treatment Plan : BREAST  Docetaxel + Carboplatin + Trastuzumab + Pertuzumab  (TCHP) q21d      11/20/2022 -  Chemotherapy   Patient is on Treatment Plan : BREAST Trastuzumab  + Pertuzumab q21d x 13 cycles       CHIEF COMPLIANT: Herceptin and Perjeta maintenance  History of Present Illness   The patient, with a history of chemotherapy, presents with a recent infection at an incision site. The infection developed a couple of weeks ago and became red, hot, and swollen. The patient completed a two-week course of antibiotics, which seemed to improve the infection. However, the patient noticed some leakage from the site today.  In addition to the infection, the patient also experienced blisters after sun exposure while [place] the beach. The patient believes this may be a reaction to the chemotherapy as she has developed increased sensitivity since starting treatment. The blisters have since resolved.  The patient also reports numbness in the feet, which started about a month ago. The patient believes this may also be a side effect of the chemotherapy.     ALLERGIES:  is allergic to gabapentin (once-daily) and penicillins.  MEDICATIONS:  Current Outpatient Medications  Medication Sig Dispense Refill   ALPRAZolam (XANAX) 0.5 MG tablet 1 TABLET ORALLY TWICE DAILY AS NEEDED 30 DAYS     amoxicillin-clavulanate (AUGMENTIN) 875-125 MG tablet Take 1 tablet by mouth 2 (two) times daily. 14 tablet 0   aspirin EC 81 MG tablet Take 81 mg by mouth daily.     cholestyramine (QUESTRAN) 4 g packet Take 1 packet (4 g total) by mouth 3 (three) times daily with  meals. 60 each 12   diphenoxylate-atropine (LOMOTIL) 2.5-0.025 MG tablet TAKE 1 TABLET BY MOUTH 4 TIMES A DAY AS NEEDED FOR DIARRHEA OR LOOSE STOOLS 30 tablet 3   lisinopril (ZESTRIL) 20 MG tablet Take 20 mg by mouth  daily.     loratadine (CLARITIN) 10 MG tablet Take 10 mg by mouth daily as needed for allergies.     LORazepam (ATIVAN) 0.5 MG tablet Take 1 tablet (0.5 mg total) by mouth daily as needed for anxiety. 30 tablet 0   omeprazole (PRILOSEC) 20 MG capsule Take by mouth.     rosuvastatin (CRESTOR) 20 MG tablet Take 20 mg by mouth daily.     sertraline (ZOLOFT) 100 MG tablet Take 100 mg by mouth daily.     triamcinolone ointment (KENALOG) 0.5 % Apply 1 Application topically 2 (two) times daily. 30 g 2   valACYclovir (VALTREX) 1000 MG tablet Take 1 tablet (1,000 mg total) by mouth 2 (two) times daily. 20 tablet 0   No current facility-administered medications for this visit.   Facility-Administered Medications Ordered in Other Visits  Medication Dose Route Frequency Provider Last Rate Last Admin   heparin lock flush 100 unit/mL  250 Units Intracatheter PRN [name], MD       sodium chloride flush (NS) 0.9 % injection 3 mL  3 mL Intracatheter PRN [name], MD        PHYSICAL EXAMINATION: ECOG PERFORMANCE STATUS: 1 - Symptomatic but completely ambulatory  Vitals:   01/06/23 1430  BP: 119/70  Pulse: 77  Resp: 18  Temp: (!) 97.3 F (36.3 C)  SpO2: 99%   Filed Weights   01/06/23 1430  Weight: 162 lb 9.6 oz (73.8 kg)    Physical Exam clear serous discharge from the surgical incision left breast lateral aspect         LABORATORY DATA:  I have reviewed the data as listed    Latest Ref Rng & Units 01/06/2023    1:46 PM 12/15/2022    8:14 AM 11/20/2022   10:21 AM  CMP  Glucose 70 - 99 mg/dL 161  98  096   BUN 6 - 20 mg/dL 24  25  16    Creatinine 0.44 - 1.00 mg/dL 0.45  4.09  8.11   Sodium 135 - 145 mmol/L 142  142  141   Potassium 3.5 - 5.1 mmol/L 3.9  3.7  3.6   Chloride 98 - 111 mmol/L 108  109  106   CO2 22 - 32 mmol/L 28  27  29    Calcium 8.9 - 10.3 mg/dL 9.3  9.5  8.8   Total Protein 6.5 - 8.1 g/dL 6.5  6.6  6.3   Total Bilirubin 0.3 - 1.2 mg/dL 0.3  0.3  0.4    Alkaline Phos 38 - 126 U/L 73  71  85   AST 15 - 41 U/L 17  18  15    ALT 0 - 44 U/L 18  18  15      Lab Results  Component Value Date   WBC 6.2 01/06/2023   HGB 10.4 (L) 01/06/2023   HCT 31.5 (L) 01/06/2023   MCV 100.3 (H) 01/06/2023   PLT 235 01/06/2023   NEUTROABS 2.9 01/06/2023    ASSESSMENT & PLAN:  Malignant neoplasm of upper-outer quadrant of left breast in female, 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   Treatment plan: 1. Neoadjuvant chemotherapy with TCH Perjeta 6 cycles completed 09/18/2022 (Taxotere switched with Abraxane) followed by Herceptin Perjeta maintenance versus Kadcyla maintenance (based on response to neoadjuvant chemo) for 1 year 2. 11/04/2022: Left lumpectomy: No residual invasive cancer identified.  0/4 lymph nodes negative pathologic complete response 3. 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     Post-chemotherapy infection at incision site Infection improved but not completely resolved after 2 weeks of antibiotics. Drainage from incision site noted. -Start Augmentin for additional week to fully clear infection. -Advise to take probiotics to prevent antibiotic-associated diarrhea.  Post-chemotherapy peripheral neuropathy New onset of numbness in feet reported. -Encourage increased water intake and regular exercise.  General Health Maintenance -Discussed flu and COVID-19 vaccines. Decision made to defer vaccines at this time due to ongoing infection and patient preference. -Plan to revisit vaccine discussion after completion of  radiation therapy and resolution of infection.          No orders of the defined types were placed in this encounter.  The patient has a good understanding of the overall plan. she agrees with it. she will call with any problems that may develop before the next visit here. Total time spent: 30 mins including face to face time and time spent for planning, charting and co-ordination of care   Tamsen Meek, MD 01/07/23

## 2023-01-12 NOTE — Progress Notes (Incomplete)
Location of Breast Cancer: Malignant neoplasm of upper-outer quadrant of left breast in female, estrogen receptor negative   Histology per Pathology Report:  11/04/2022  FINAL MICROSCOPIC DIAGNOSIS:  A. BREAST, LEFT, LUMPECTOMY: No residual invasive ductal carcinoma identified, status post neoadjuvant chemotherapy, tumor bed estimated 2.5 x 1.5 cm Calcifications associated with carcinoma: Present in tumor bed Margins, invasive carcinoma: N/A; no residual tumor        Nearest margin invasive carcinoma: N/A Margins DCIS: No residual tumor present        Nearest margin DCIS: N/A Lymphovascular space invasion N/A Previous biopsy site and treatment related changes See oncology table below  B. LYMPH NODE, LEFT AXILLARY TARGETED, SENTINEL, EXCISION: -  1 lymph node with fibrosis histiocytosis and scar formation, negative for malignancy (0/1) serially sectioned and multiple levels examined.  C. LYMPH NODE, LEFT AXILLARY #2, SENTINEL, EXCISION: -  1 lymph node, negative for malignancy (0/1), multiple levels examined.  D. LYMPH NODE, LEFT AXILLARY #3, SENTINEL, EXCISION: -  1 lymph node, negative for malignancy (0/1), bisected and multiple levels examined.  E. LYMPH NODE, LEFT AXILLARY #4, SENTINEL, EXCISION: -  1 lymph node, negative for malignancy (0/1), multiple levels examined.  F. LYMPH NODE, LEFT AXILLARY #5, SENTINEL, EXCISION: -  1 lymph node, negative for malignancy (0/1), bisected and multiple levels examined  ONCOLOGY TABLE:  INVASIVE CARCINOMA OF THE BREAST:  Resection  Procedure: Lumpectomy Specimen Laterality: Left Histologic Type: No residual invasive ductal carcinoma identified (tumor bed with biopsy site changes present) status post neoadjuvant chemotherapy Histologic Grade: Per SAA [id number]      Glandular (Acinar)/Tubular Differentiation: 3/3      Nuclear Pleomorphism: 2/3      Mitotic Rate: 3/3      Overall Grade: III/III Tumor Size: Tumor bed  microscopically estimated approximately 2.5 x 1.5 cm Ductal Carcinoma In Situ: Not identified Lymphatic and/or Vascular Invasion: N/A Treatment Effect in the Breast: MD Dareen Piano residual cancer burden 0; class pCR Margins: All margins negative      Distance from Closest Margin (mm): N/A; no residual tumor identified      Physicist, medical (required only if <75mm): N/A DCIS Margins: N/A; no residual tumor identified      Distance from Closest Margin (mm): N/A      Specify Closest Margin (required only if <11mm): N/A Regional Lymph Nodes:      Number of Lymph Nodes Examined: 4      Number of Sentinel Nodes Examined: 4      Number of Lymph Nodes with Macrometastases (>2 mm): 0      Number of Lymph Nodes with Micrometastases: 0      Number of Lymph Nodes with Isolated Tumor Cells (=0.2 mm or =200 cells): 0      Size of Largest Metastatic Deposit (mm): N/A      Extranodal Extension: N/A Distant Metastasis:      Distant Site(s) Involved: N/A Breast Biomarker Testing Performed on Previous Biopsy:      Testing Performed on Case Number: [id number]            Estrogen Receptor: Negative, 0%            Progesterone Receptor: Negative, 0%            HER2: Positive, 3+            Ki-67: 35% Pathologic Stage Classification (pTNM, AJCC 8th Edition): ypT0, ypN0 Representative Tumor Block: N/A Comment(s): The area around the barbell clip consists of  biopsy site changes with no residual complex sclerosing lesion/radial scar remaining (v4.5.0.0)   11/14/2022  FINAL MICROSCOPIC DIAGNOSIS:   A. BREAST, LEFT, REDUCTION:       Benign breast tissue with dense stromal fibrosis, adenosis, focal  columnar cell change, microcysts and calcifications.       Ectatic mammary ducts with periductal lymphocytic inflammation.       Negative for atypia, residual carcinoma or malignancy.   B. BREAST, RIGHT, REDUCTION:       Benign breast tissue with dense stromal fibrosis, sclerosing  adenosis, and  microcysts.       Negative for atypia or malignancy.   Receptor Status: ER(0%), PR (0%), Her2-neu ( Positive, 3+), Ki-67(35%)  Did patient present with symptoms (if so, please note symptoms) or was this found on screening mammography?: Patient of Drs. Byerly and here for staged breast reconstruction. Presented with palpable left breast mass. MMG/US showed left breast 1 o clock 4.2 cm mass. Biopsy showed IDC, ER/PR-, Her2 +. She was also noted to have palpable LN on presentation and biopsy of this positive for metastatic disease.   Past/Anticipated interventions by surgeon, if any: 11-04-22 Left Breast Radioactive seed bracketed lumpectomy, seed targeted left axillary lymph node biopsy and sentinel lymph node biopsy   Indications: This patient presents with history of left breast cancer, cT2N1 grade 3 invasive ductal carcinoma, upper outer quadrant, ER-/PR-/Her2+, s/p neoadjuvant chemo  Pre-operative Diagnosis: left breast cancer   Post-operative Diagnosis: Same   Surgeon: [name]    11-14-22 PREOPERATIVE DIAGNOSES:  1. Left breast cancer UOQ ER-   POSTOPERATIVE DIAGNOSES:  same   PROCEDURE:  Bilateral breast mastopexy (CPT 19316)   SURGEON: Glenna Fellows MD MBA  Past/Anticipated interventions by medical oncology, if any: [name], MD 11/20/2022  Malignant neoplasm of upper-outer quadrant of left breast in female, estrogen receptor negative (HCC)  05/15/2022 Initial Diagnosis    Palpable mass in the left breast at 1 o'clock position measured 4.2 cm ultrasound-guided biopsy revealed grade 3 IDC ER/PR negative HER2 positive Ki-67 35%, left axillary lymph node: Biopsy positive    05/21/2022 Cancer Staging    Staging form: Breast, AJCC 8th Edition - Clinical stage from 05/21/2022: Stage IIB (cT2, cN1, cM0, G3, ER-, PR-, HER2+) - Signed by [name], MD on 05/21/2022 Stage prefix: Initial diagnosis Histologic grading system: 3 grade system    05/29/2022 Genetic Testing     Negative [facility] Multi-Cancer +RNA Panel.  Report date is 05/29/2022.    The Multi-Cancer + RNA Panel offered by [facility] includes sequencing and/or deletion/duplication analysis of the following 70 genes:  AIP*, ALK, APC*, ATM*, AXIN2*, BAP1*, BARD1*, BLM*, BMPR1A*, BRCA1*, BRCA2*, BRIP1*, CDC73*, CDH1*, CDK4, CDKN1B*, CDKN2A, CHEK2*, CTNNA1*, DICER1*, EPCAM (del/dup only), EGFR, FH*, FLCN*, GREM1 (promoter dup only), HOXB13, KIT, LZTR1, MAX*, MBD4, MEN1*, MET, MITF, MLH1*, MSH2*, MSH3*, MSH6*, MUTYH*, NF1*, NF2*, NTHL1*, PALB2*, PDGFRA, PMS2*, POLD1*, POLE*, POT1*, PRKAR1A*, PTCH1*, PTEN*, RAD51C*, RAD51D*, RB1*, RET, SDHA* (sequencing only), SDHAF2*, SDHB*, SDHC*, SDHD*, SMAD4*, SMARCA4*, SMARCB1*, SMARCE1*, STK11*, SUFU*, TMEM127*, TP53*, TSC1*, TSC2*, VHL*. RNA analysis is performed for * genes.    06/05/2022 - 10/29/2022 Chemotherapy    Patient is on Treatment Plan : BREAST  Docetaxel + Carboplatin + Trastuzumab + Pertuzumab  (TCHP) q21d      11/20/2022 -  Chemotherapy    Patient is on Treatment Plan : BREAST Trastuzumab  + Pertuzumab q21d x 13 cycles     ASSESSMENT & PLAN:  Malignant neoplasm of upper-outer quadrant of left breast  in female, 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   Treatment plan: 1. Neoadjuvant chemotherapy with TCH Perjeta 6 cycles completed 09/18/2022 (Taxotere switched with Abraxane) followed by Herceptin Perjeta maintenance versus Kadcyla maintenance (based on response to neoadjuvant chemo) for 1 year 2. Followed by breast conserving surgery if possible with sentinel lymph node study 3. 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   11/04/2022: Left lumpectomy: No residual invasive cancer identified.  0/4 lymph nodes negative pathologic complete response   Neuropathic pain under the arm: I sent a prescription for gabapentin. Lip canker sores: Currently on Valtrex. Severe anemia: Status post surgeries.  They should improve spontaneously.   She has a vacation coming up at [facility] and hence we will postpone 9/5 treatment till the following week Return to clinic every 3 weeks for Herceptin Perjeta and every 6 weeks for follow-up with me.  Lymphedema issues, if any:  {:18581} {t:21944}   Pain issues, if any:  {:18581} {PAIN DESCRIPTION:[id number]}  SAFETY ISSUES: Prior radiation? {:18581} Pacemaker/ICD? {:18581} Possible current pregnancy?no Is the patient on methotrexate? no  Current Complaints / other details:  ***

## 2023-01-12 NOTE — Progress Notes (Signed)
Radiation Oncology         [phone] ________________________________  Name: Erika [name] MRN: [id number]  Date: 01/13/2023  DOB: Mar 03, 1961  Re-Evaluation Note  CC: [name], Elyn Peers, MD  No diagnosis found.  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 She was seen in the multidisciplinary breast clinic on 05/21/22.   She underwent genetic testing on her consultation date. Results showed no clinically significant variants detected by [facility] genetic testing.   Since her consultation date, she presented for a bilateral breast MRI on 05/25/22 which demonstrated the 2.9 cm rim enhancing mass in the upper-outer quadrant of the left breast corresponding with the biopsy proven site of IDC, and a 2 cm abnormal lymph node in the left axilla corresponding with the known metastatic axillary lymph node.   She has been treated with neoadjuvant chemotherapy consisting of TCH Perjeta 6 cycles from 06/05/22 through  09/18/2022 under the care of Dr. Pamelia Hoit. She had a somewhat difficult time with chemotherapy and had an allergic reaction to Taxotere which was subsequently switched to Abraxane. This was followed by Herceptin Perjeta maintenance which she is currently continuing with. She is tolerating maintenance treatment well overall other than severe anemia (required transfusions). 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   Bilateral breast MRI (s/p neoadjuvant chemotherapy) performed on 09/19/22 demonstrated: a non-mass-like enhancement measuring 2.9 cm at the site of the previously seen primary mass in the upper outer  left breast, and several small  enhancing nodules located superior and medial to the biopsy proven malignancy. (The non-mass enhancement and small indeterminate enhancing nodules collectively spanned an area measuring 4.7 cm).   She opted to proceed with a left breast lumpectomy with left axillary SLN biopsies on 11/04/22 under the care fo Dr. Donell Beers. Pathology from the procedure revealed no evidence of residual carcinoma s/p neoadjuvant chemotherapy. Nodal status of 4/4 left axillary SLN excisions negative for carcinoma.    She then proceeded with bilateral reductions on 11/14/22 under the care of Dr. Leta Baptist. Pathology showed no evidence of atypia or malignancy in either breast.   She did develop some post-operative swelling, rash, and a left axillary seroma which was aspirated by Dr. Donell Beers on 11/27/22 and again on 12/15/22.  She returned to general surgery on 09/09 with c/o drainage and redness with an opening in her wound. She was subsequently prescribed a course of doxycycline that day without much improvement requiring an additional 7 day course to be prescribed on 09/20. Her redness and drainage resolved per Dr. Donell Beers after completing an additional course of abx. However, Dr. Pamelia Hoit noted some drainage from the incision site on 10/01 and prescribed her a course of Augmentin.   Based on her negative ER status, she does not need antiestrogen therapy. She will continue with maintenance immunotherapy x 1 year and follow-up with Dr. Pamelia Hoit on a routine basis.   On review of systems, the patient reports ***. She denies *** and any other symptoms.    Allergies:  is allergic to gabapentin (once-daily) and penicillins.  Meds: Current Outpatient Medications  Medication Sig Dispense Refill   ALPRAZolam (XANAX) 0.5 MG tablet 1 TABLET ORALLY TWICE DAILY AS  NEEDED 30 DAYS     amoxicillin-clavulanate (AUGMENTIN) 875-125 MG tablet Take 1 tablet by mouth 2 (two) times daily. 14 tablet 0   aspirin EC 81  MG tablet Take 81 mg by mouth daily.     cholestyramine (QUESTRAN) 4 g packet Take 1 packet (4 g total) by mouth 3 (three) times daily with meals. 60 each 12   diphenoxylate-atropine (LOMOTIL) 2.5-0.025 MG tablet TAKE 1 TABLET BY MOUTH 4 TIMES A DAY AS NEEDED FOR DIARRHEA OR LOOSE STOOLS 30 tablet 3   lisinopril (ZESTRIL) 20 MG tablet Take 20 mg by mouth daily.     loratadine (CLARITIN) 10 MG tablet Take 10 mg by mouth daily as needed for allergies.     LORazepam (ATIVAN) 0.5 MG tablet Take 1 tablet (0.5 mg total) by mouth daily as needed for anxiety. 30 tablet 0   omeprazole (PRILOSEC) 20 MG capsule Take by mouth.     rosuvastatin (CRESTOR) 20 MG tablet Take 20 mg by mouth daily.     sertraline (ZOLOFT) 100 MG tablet Take 100 mg by mouth daily.     triamcinolone ointment (KENALOG) 0.5 % Apply 1 Application topically 2 (two) times daily. 30 g 2   valACYclovir (VALTREX) 1000 MG tablet Take 1 tablet (1,000 mg total) by mouth 2 (two) times daily. 20 tablet 0   No current facility-administered medications for this encounter.   Facility-Administered Medications Ordered in Other Encounters  Medication Dose Route Frequency Provider Last Rate Last Admin   heparin lock flush 100 unit/mL  250 Units Intracatheter PRN [name], MD       sodium chloride flush (NS) 0.9 % injection 3 mL  3 mL Intracatheter PRN [name], MD        Physical Findings: The patient is in no acute distress. Patient is alert and oriented.  vitals were not taken for this visit.  No significant changes. Lungs are clear to auscultation bilaterally. Heart has regular rate and rhythm. No palpable cervical, supraclavicular, or axillary adenopathy. Abdomen soft, non-tender, normal bowel sounds. Right Breast: no palpable mass, nipple discharge or bleeding. Left Breast: ***  Lab Findings: Lab Results  Component Value Date   WBC 6.2 01/06/2023   HGB 10.4 (L) 01/06/2023   HCT 31.5 (L) 01/06/2023   MCV 100.3 (H) 01/06/2023    PLT 235 01/06/2023    Radiographic Findings: ECHOCARDIOGRAM COMPLETE  Result Date: 01/05/2023    ECHOCARDIOGRAM REPORT   Patient Name:   Erika [name] Date of Exam: 01/05/2023 Medical Rec #:  [id number]       Height:       68.0 in Accession #:    [id number]      Weight:       161.0 lb Date of Birth:  12-18-1960      BSA:          1.864 m Patient Age:    61 years        BP:           128/70 mmHg Patient Gender: F               HR:           80 bpm. Exam Location:  Outpatient Procedure: 2D Echo, Cardiac Doppler, Color Doppler and Strain Analysis Indications:    Chemo  History:        Patient has prior history of Echocardiogram examinations, most  recent 08/25/2022. Malignant neoplasm of upper-outer quadrant of                 left breast in female, estrogen receptor negative.  Sonographer:    Dondra Prader RVT RCS Referring Phys: [id number] [name] IMPRESSIONS  1. Left ventricular ejection fraction, by estimation, is 60 to 65%. The left ventricle has normal function. The left ventricle has no regional wall motion abnormalities. Left ventricular diastolic parameters were normal. The average left ventricular global longitudinal strain is -17.1 %. The global longitudinal strain is normal.  2. Right ventricular systolic function is normal. The right ventricular size is normal.  3. The mitral valve is normal in structure. No evidence of mitral valve regurgitation. No evidence of mitral stenosis.  4. The aortic valve is tricuspid. Aortic valve regurgitation is trivial. No aortic stenosis is present.  5. The inferior vena cava is normal in size with greater than 50% respiratory variability, suggesting right atrial pressure of 3 mmHg. FINDINGS  Left Ventricle: Left ventricular ejection fraction, by estimation, is 60 to 65%. The left ventricle has normal function. The left ventricle has no regional wall motion abnormalities. The average left ventricular global longitudinal strain is -17.1 %. The global  longitudinal strain is normal. The left ventricular internal cavity size was normal in size. There is no left ventricular hypertrophy. Left ventricular diastolic parameters were normal. Right Ventricle: The right ventricular size is normal. No increase in right ventricular wall thickness. Right ventricular systolic function is normal. Left Atrium: Left atrial size was normal in size. Right Atrium: Right atrial size was normal in size. Pericardium: There is no evidence of pericardial effusion. Mitral Valve: The mitral valve is normal in structure. No evidence of mitral valve regurgitation. No evidence of mitral valve stenosis. Tricuspid Valve: The tricuspid valve is normal in structure. Tricuspid valve regurgitation is trivial. No evidence of tricuspid stenosis. Aortic Valve: The aortic valve is tricuspid. Aortic valve regurgitation is trivial. Aortic regurgitation PHT measures 654 msec. No aortic stenosis is present. Aortic valve mean gradient measures 5.0 mmHg. Aortic valve peak gradient measures 9.4 mmHg. Aortic valve area, by VTI measures 2.32 cm. Pulmonic Valve: The pulmonic valve was normal in structure. Pulmonic valve regurgitation is trivial. No evidence of pulmonic stenosis. Aorta: The aortic root is normal in size and structure. Venous: The inferior vena cava is normal in size with greater than 50% respiratory variability, suggesting right atrial pressure of 3 mmHg. IAS/Shunts: No atrial level shunt detected by color flow Doppler.  LEFT VENTRICLE PLAX 2D LVIDd:         3.80 cm   Diastology LVIDs:         2.40 cm   LV e' medial:    6.96 cm/s LV PW:         0.90 cm   LV E/e' medial:  11.1 LV IVS:        0.70 cm   LV e' lateral:   7.83 cm/s LVOT diam:     2.10 cm   LV E/e' lateral: 9.8 LV SV:         74 LV SV Index:   40        2D Longitudinal Strain LVOT Area:     3.46 cm  2D Strain GLS Avg:     -17.1 %  RIGHT VENTRICLE             IVC RV Basal diam:  2.80 cm     IVC diam: 1.90 cm RV  S prime:     12.50 cm/s  TAPSE (M-mode): 2.4 cm LEFT ATRIUM             Index        RIGHT ATRIUM          Index LA diam:        2.50 cm 1.34 cm/m   RA Area:     7.64 cm LA Vol (A2C):   54.5 ml 29.24 ml/m  RA Volume:   12.30 ml 6.60 ml/m LA Vol (A4C):   55.7 ml 29.89 ml/m LA Biplane Vol: 55.8 ml 29.94 ml/m  AORTIC VALVE                     PULMONIC VALVE AV Area (Vmax):    2.51 cm      PV Vmax:          1.09 m/s AV Area (Vmean):   2.38 cm      PV Peak grad:     4.8 mmHg AV Area (VTI):     2.32 cm      PR End Diast Vel: 3.30 msec AV Vmax:           153.00 cm/s AV Vmean:          104.000 cm/s AV VTI:            0.321 m AV Peak Grad:      9.4 mmHg AV Mean Grad:      5.0 mmHg LVOT Vmax:         111.00 cm/s LVOT Vmean:        71.600 cm/s LVOT VTI:          0.215 m LVOT/AV VTI ratio: 0.67 AI PHT:            654 msec  AORTA Ao Root diam: 3.10 cm Ao Asc diam:  3.20 cm MITRAL VALVE MV Area (PHT): 4.15 cm    SHUNTS MV Decel Time: 183 msec    Systemic VTI:  0.22 m MV E velocity: 77.10 cm/s  Systemic Diam: 2.10 cm MV A velocity: 80.10 cm/s MV E/A ratio:  0.96 Charlton Haws MD Electronically signed by Charlton Haws MD Signature Date/Time: 01/05/2023/11:42:00 AM    Final     Impression:  Left breast with no residual carcinoma : s/p neoadjuvant chemotherapy, left lumpectomy w/ SLN evaluation, and bilateral breast reductions  Stage IIB (cT2, cN1, cM0) Left Breast UOQ, 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 ***  -----------------------------------  Billie Lade, PhD, MD  This document serves as a record of services personally performed by Antony Blackbird, MD. It was created on his behalf by Neena Rhymes, a trained medical scribe. The creation of this record is based on the scribe's personal observations and the provider's statements to them. This document has been checked and approved by the attending  provider.

## 2023-01-13 ENCOUNTER — Ambulatory Visit
Admission: RE | Admit: 2023-01-13 | Discharge: 2023-01-13 | Disposition: A | Discharge status: Home / Self Care | Payer: 59 | Source: Ambulatory Visit | Attending: Radiation Oncology | Admitting: Radiation Oncology

## 2023-01-13 ENCOUNTER — Encounter: Payer: Self-pay | Admitting: Radiation Oncology

## 2023-01-13 VITALS — BP 127/83 | HR 96 | Temp 96.9°F | Resp 18

## 2023-01-13 DIAGNOSIS — Z79899 Other long term (current) drug therapy: Secondary | ICD-10-CM | POA: Insufficient documentation

## 2023-01-13 DIAGNOSIS — C50412 Malignant neoplasm of upper-outer quadrant of left female breast: Secondary | ICD-10-CM | POA: Diagnosis present

## 2023-01-13 DIAGNOSIS — Z171 Estrogen receptor negative status [ER-]: Secondary | ICD-10-CM | POA: Diagnosis not present

## 2023-01-13 DIAGNOSIS — Z7982 Long term (current) use of aspirin: Secondary | ICD-10-CM | POA: Diagnosis not present

## 2023-01-13 DIAGNOSIS — Z79624 Long term (current) use of inhibitors of nucleotide synthesis: Secondary | ICD-10-CM | POA: Insufficient documentation

## 2023-01-13 DIAGNOSIS — I351 Nonrheumatic aortic (valve) insufficiency: Secondary | ICD-10-CM | POA: Diagnosis not present

## 2023-01-20 ENCOUNTER — Encounter: Payer: Self-pay | Admitting: *Deleted

## 2023-01-20 DIAGNOSIS — C50412 Malignant neoplasm of upper-outer quadrant of left female breast: Secondary | ICD-10-CM | POA: Diagnosis not present

## 2023-01-27 ENCOUNTER — Ambulatory Visit
Admission: RE | Admit: 2023-01-27 | Discharge: 2023-01-27 | Disposition: A | Discharge status: Home / Self Care | Payer: 59 | Source: Ambulatory Visit | Attending: Radiation Oncology | Admitting: Radiation Oncology

## 2023-01-27 ENCOUNTER — Ambulatory Visit: Payer: 59 | Admitting: Radiation Oncology

## 2023-01-27 ENCOUNTER — Other Ambulatory Visit: Payer: Self-pay

## 2023-01-27 DIAGNOSIS — Z171 Estrogen receptor negative status [ER-]: Secondary | ICD-10-CM

## 2023-01-27 DIAGNOSIS — C50412 Malignant neoplasm of upper-outer quadrant of left female breast: Secondary | ICD-10-CM | POA: Diagnosis not present

## 2023-01-27 LAB — RAD ONC ARIA SESSION SUMMARY

## 2023-01-27 MED ORDER — ALRA NON-METALLIC DEODORANT (RAD-ONC)
1.0000 | Freq: Once | TOPICAL | Status: AC
  Administered 2023-01-27: 1 via TOPICAL

## 2023-01-27 MED ORDER — RADIAPLEXRX EX GEL: Freq: Once | CUTANEOUS | Status: AC

## 2023-01-28 ENCOUNTER — Ambulatory Visit: Payer: 59

## 2023-01-28 ENCOUNTER — Ambulatory Visit
Admission: RE | Admit: 2023-01-28 | Discharge: 2023-01-28 | Disposition: A | Discharge status: Home / Self Care | Payer: 59 | Source: Ambulatory Visit | Attending: Radiation Oncology | Admitting: Radiation Oncology

## 2023-01-28 ENCOUNTER — Other Ambulatory Visit: Payer: Self-pay

## 2023-01-28 DIAGNOSIS — C50412 Malignant neoplasm of upper-outer quadrant of left female breast: Secondary | ICD-10-CM | POA: Diagnosis not present

## 2023-01-28 LAB — RAD ONC ARIA SESSION SUMMARY

## 2023-01-29 ENCOUNTER — Inpatient Hospital Stay: Payer: 59

## 2023-01-29 ENCOUNTER — Ambulatory Visit: Payer: 59

## 2023-01-29 ENCOUNTER — Ambulatory Visit
Admission: RE | Admit: 2023-01-29 | Discharge: 2023-01-29 | Disposition: A | Discharge status: Home / Self Care | Payer: 59 | Source: Ambulatory Visit | Attending: Radiation Oncology | Admitting: Radiation Oncology

## 2023-01-29 ENCOUNTER — Other Ambulatory Visit: Payer: Self-pay

## 2023-01-29 VITALS — BP 119/77 | HR 83 | Temp 97.9°F | Resp 17 | Wt 164.2 lb

## 2023-01-29 DIAGNOSIS — C50412 Malignant neoplasm of upper-outer quadrant of left female breast: Secondary | ICD-10-CM | POA: Diagnosis not present

## 2023-01-29 DIAGNOSIS — Z171 Estrogen receptor negative status [ER-]: Secondary | ICD-10-CM

## 2023-01-29 LAB — RAD ONC ARIA SESSION SUMMARY

## 2023-01-29 MED ORDER — SODIUM CHLORIDE 0.9% FLUSH
10.0000 mL | Freq: Once | INTRAVENOUS | Status: AC
  Administered 2023-01-29: 10 mL via INTRAVENOUS

## 2023-01-29 MED ORDER — SODIUM CHLORIDE 0.9 % IV SOLN
420.0000 mg | Freq: Once | INTRAVENOUS | Status: AC
  Administered 2023-01-29: 420 mg via INTRAVENOUS
  Filled 2023-01-29: qty 14

## 2023-01-29 MED ORDER — ACETAMINOPHEN 325 MG PO TABS
650.0000 mg | ORAL_TABLET | Freq: Once | ORAL | Status: AC
  Administered 2023-01-29: 650 mg via ORAL
  Filled 2023-01-29: qty 2

## 2023-01-29 MED ORDER — HEPARIN SOD (PORK) LOCK FLUSH 100 UNIT/ML IV SOLN
500.0000 [IU] | Freq: Once | INTRAVENOUS | Status: AC | PRN
  Administered 2023-01-29: 500 [IU]

## 2023-01-29 MED ORDER — TRASTUZUMAB-ANNS CHEMO 150 MG IV SOLR
6.0000 mg/kg | Freq: Once | INTRAVENOUS | Status: AC
  Administered 2023-01-29: 420 mg via INTRAVENOUS
  Filled 2023-01-29: qty 20

## 2023-01-29 MED ORDER — SODIUM CHLORIDE 0.9 % IV SOLN: Freq: Once | INTRAVENOUS | Status: AC

## 2023-01-29 MED ORDER — DIPHENHYDRAMINE HCL 25 MG PO CAPS
25.0000 mg | ORAL_CAPSULE | Freq: Once | ORAL | Status: AC
  Administered 2023-01-29: 25 mg via ORAL
  Filled 2023-01-29: qty 1

## 2023-01-29 NOTE — Patient Instructions (Signed)
[facility]  Discharge Instructions: Thank you for choosing [facility] to provide your oncology and hematology care.   If you have a lab appointment with the [facility], please go directly to the [facility] and check in at the registration area.   Wear comfortable clothing and clothing appropriate for easy access to any Portacath or PICC line.   We strive to give you quality time with your provider. You may need to reschedule your appointment if you arrive late (15 or more minutes).  Arriving late affects you and other patients whose appointments are after yours.  Also, if you miss three or more appointments without notifying the office, you may be dismissed from the clinic at the provider's discretion.      For prescription refill requests, have your pharmacy contact our office and allow 72 hours for refills to be completed.    Today you received the following chemotherapy and/or immunotherapy agents :  Trastuzumab & Pertuzumab      To help prevent nausea and vomiting after your treatment, we encourage you to take your nausea medication as directed.  BELOW ARE SYMPTOMS THAT SHOULD BE REPORTED IMMEDIATELY: *FEVER GREATER THAN 100.4 F (38 C) OR HIGHER *CHILLS OR SWEATING *NAUSEA AND VOMITING THAT IS NOT CONTROLLED WITH YOUR NAUSEA MEDICATION *UNUSUAL SHORTNESS OF BREATH *UNUSUAL BRUISING OR BLEEDING *URINARY PROBLEMS (pain or burning when urinating, or frequent urination) *BOWEL PROBLEMS (unusual diarrhea, constipation, pain near the anus) TENDERNESS IN MOUTH AND THROAT WITH OR WITHOUT PRESENCE OF ULCERS (sore throat, sores in mouth, or a toothache) UNUSUAL RASH, SWELLING OR PAIN  UNUSUAL VAGINAL DISCHARGE OR ITCHING   Items with * indicate a potential emergency and should be followed up as soon as possible or go to the Emergency Department if any problems should occur.  Please show the CHEMOTHERAPY ALERT CARD or IMMUNOTHERAPY  ALERT CARD at check-in to the Emergency Department and triage nurse.  Should you have questions after your visit or need to cancel or reschedule your appointment, please contact [facility]  Dept: [phone]  and follow the prompts.  Office hours are 8:00 a.m. to 4:30 p.m. Monday - Friday. Please note that voicemails left after 4:00 p.m. may not be returned until the following business day.  We are closed weekends and major holidays. You have access to a nurse at all times for urgent questions. Please call the main number to the clinic Dept: [phone] and follow the prompts.   For any non-urgent questions, you may also contact your provider using MyChart. We now offer e-Visits for anyone 33 and older to request care online for non-urgent symptoms. For details visit [email or web address].   Also download the MyChart app! Go to the app store, search "MyChart", open the app, select [facility], and log in with your MyChart username and password.

## 2023-01-30 ENCOUNTER — Ambulatory Visit
Admission: RE | Admit: 2023-01-30 | Discharge: 2023-01-30 | Disposition: A | Discharge status: Home / Self Care | Payer: 59 | Source: Ambulatory Visit | Attending: Radiation Oncology | Admitting: Radiation Oncology

## 2023-01-30 ENCOUNTER — Other Ambulatory Visit: Payer: Self-pay

## 2023-01-30 ENCOUNTER — Ambulatory Visit: Payer: 59

## 2023-01-30 DIAGNOSIS — C50412 Malignant neoplasm of upper-outer quadrant of left female breast: Secondary | ICD-10-CM | POA: Diagnosis not present

## 2023-01-30 LAB — RAD ONC ARIA SESSION SUMMARY

## 2023-02-02 ENCOUNTER — Ambulatory Visit
Admission: RE | Admit: 2023-02-02 | Discharge: 2023-02-02 | Disposition: A | Discharge status: Home / Self Care | Payer: 59 | Source: Ambulatory Visit | Attending: Radiation Oncology | Admitting: Radiation Oncology

## 2023-02-02 ENCOUNTER — Other Ambulatory Visit: Payer: Self-pay

## 2023-02-02 ENCOUNTER — Ambulatory Visit: Payer: 59

## 2023-02-02 DIAGNOSIS — C50412 Malignant neoplasm of upper-outer quadrant of left female breast: Secondary | ICD-10-CM | POA: Diagnosis not present

## 2023-02-02 LAB — RAD ONC ARIA SESSION SUMMARY

## 2023-02-03 ENCOUNTER — Ambulatory Visit
Admission: RE | Admit: 2023-02-03 | Discharge: 2023-02-03 | Disposition: A | Discharge status: Home / Self Care | Payer: 59 | Source: Ambulatory Visit | Attending: Radiation Oncology | Admitting: Radiation Oncology

## 2023-02-03 ENCOUNTER — Ambulatory Visit: Payer: 59

## 2023-02-03 ENCOUNTER — Other Ambulatory Visit: Payer: Self-pay

## 2023-02-03 DIAGNOSIS — C50412 Malignant neoplasm of upper-outer quadrant of left female breast: Secondary | ICD-10-CM | POA: Diagnosis not present

## 2023-02-03 LAB — RAD ONC ARIA SESSION SUMMARY

## 2023-02-04 ENCOUNTER — Other Ambulatory Visit: Payer: Self-pay

## 2023-02-04 ENCOUNTER — Ambulatory Visit: Payer: 59

## 2023-02-04 ENCOUNTER — Ambulatory Visit
Admission: RE | Admit: 2023-02-04 | Discharge: 2023-02-04 | Disposition: A | Discharge status: Home / Self Care | Payer: 59 | Source: Ambulatory Visit | Attending: Radiation Oncology | Admitting: Radiation Oncology

## 2023-02-04 DIAGNOSIS — C50412 Malignant neoplasm of upper-outer quadrant of left female breast: Secondary | ICD-10-CM | POA: Diagnosis not present

## 2023-02-04 LAB — RAD ONC ARIA SESSION SUMMARY

## 2023-02-05 ENCOUNTER — Other Ambulatory Visit: Payer: Self-pay

## 2023-02-05 ENCOUNTER — Ambulatory Visit: Payer: 59

## 2023-02-05 ENCOUNTER — Ambulatory Visit
Admission: RE | Admit: 2023-02-05 | Discharge: 2023-02-05 | Disposition: A | Discharge status: Home / Self Care | Payer: 59 | Source: Ambulatory Visit | Attending: Radiation Oncology | Admitting: Radiation Oncology

## 2023-02-05 DIAGNOSIS — C50412 Malignant neoplasm of upper-outer quadrant of left female breast: Secondary | ICD-10-CM | POA: Diagnosis not present

## 2023-02-05 LAB — RAD ONC ARIA SESSION SUMMARY

## 2023-02-06 ENCOUNTER — Ambulatory Visit
Admission: RE | Admit: 2023-02-06 | Discharge: 2023-02-06 | Disposition: A | Discharge status: Home / Self Care | Payer: 59 | Source: Ambulatory Visit | Attending: Radiation Oncology | Admitting: Radiation Oncology

## 2023-02-06 ENCOUNTER — Other Ambulatory Visit: Payer: Self-pay

## 2023-02-06 ENCOUNTER — Ambulatory Visit: Payer: 59

## 2023-02-06 DIAGNOSIS — Z171 Estrogen receptor negative status [ER-]: Secondary | ICD-10-CM | POA: Insufficient documentation

## 2023-02-06 DIAGNOSIS — C50412 Malignant neoplasm of upper-outer quadrant of left female breast: Secondary | ICD-10-CM | POA: Insufficient documentation

## 2023-02-06 DIAGNOSIS — Z510 Encounter for antineoplastic radiation therapy: Secondary | ICD-10-CM | POA: Insufficient documentation

## 2023-02-06 DIAGNOSIS — Z7982 Long term (current) use of aspirin: Secondary | ICD-10-CM | POA: Diagnosis not present

## 2023-02-06 DIAGNOSIS — Z23 Encounter for immunization: Secondary | ICD-10-CM | POA: Diagnosis not present

## 2023-02-06 DIAGNOSIS — Z5112 Encounter for antineoplastic immunotherapy: Secondary | ICD-10-CM | POA: Diagnosis present

## 2023-02-06 DIAGNOSIS — Z79899 Other long term (current) drug therapy: Secondary | ICD-10-CM | POA: Diagnosis not present

## 2023-02-06 DIAGNOSIS — Z79624 Long term (current) use of inhibitors of nucleotide synthesis: Secondary | ICD-10-CM | POA: Diagnosis not present

## 2023-02-06 LAB — RAD ONC ARIA SESSION SUMMARY

## 2023-02-09 ENCOUNTER — Ambulatory Visit: Payer: 59

## 2023-02-09 ENCOUNTER — Ambulatory Visit
Admission: RE | Admit: 2023-02-09 | Discharge: 2023-02-09 | Disposition: A | Discharge status: Home / Self Care | Payer: 59 | Source: Ambulatory Visit | Attending: Radiation Oncology | Admitting: Radiation Oncology

## 2023-02-09 ENCOUNTER — Other Ambulatory Visit: Payer: Self-pay

## 2023-02-09 DIAGNOSIS — Z5112 Encounter for antineoplastic immunotherapy: Secondary | ICD-10-CM | POA: Diagnosis not present

## 2023-02-09 LAB — RAD ONC ARIA SESSION SUMMARY

## 2023-02-10 ENCOUNTER — Ambulatory Visit
Admission: RE | Admit: 2023-02-10 | Discharge: 2023-02-10 | Disposition: A | Discharge status: Home / Self Care | Payer: 59 | Source: Ambulatory Visit | Attending: Radiation Oncology | Admitting: Radiation Oncology

## 2023-02-10 ENCOUNTER — Ambulatory Visit: Payer: 59

## 2023-02-10 ENCOUNTER — Other Ambulatory Visit: Payer: Self-pay

## 2023-02-10 ENCOUNTER — Ambulatory Visit: Payer: 59 | Admitting: Radiation Oncology

## 2023-02-10 DIAGNOSIS — Z5112 Encounter for antineoplastic immunotherapy: Secondary | ICD-10-CM | POA: Diagnosis not present

## 2023-02-10 LAB — RAD ONC ARIA SESSION SUMMARY

## 2023-02-11 ENCOUNTER — Other Ambulatory Visit: Payer: Self-pay

## 2023-02-11 ENCOUNTER — Ambulatory Visit: Payer: 59

## 2023-02-11 ENCOUNTER — Ambulatory Visit
Admission: RE | Admit: 2023-02-11 | Discharge: 2023-02-11 | Disposition: A | Discharge status: Home / Self Care | Payer: 59 | Source: Ambulatory Visit | Attending: Radiation Oncology | Admitting: Radiation Oncology

## 2023-02-11 DIAGNOSIS — Z5112 Encounter for antineoplastic immunotherapy: Secondary | ICD-10-CM | POA: Diagnosis not present

## 2023-02-11 LAB — RAD ONC ARIA SESSION SUMMARY

## 2023-02-12 ENCOUNTER — Other Ambulatory Visit: Payer: Self-pay

## 2023-02-12 ENCOUNTER — Ambulatory Visit
Admission: RE | Admit: 2023-02-12 | Discharge: 2023-02-12 | Disposition: A | Discharge status: Home / Self Care | Payer: 59 | Source: Ambulatory Visit | Attending: Radiation Oncology | Admitting: Radiation Oncology

## 2023-02-12 ENCOUNTER — Ambulatory Visit: Payer: 59

## 2023-02-12 DIAGNOSIS — Z5112 Encounter for antineoplastic immunotherapy: Secondary | ICD-10-CM | POA: Diagnosis not present

## 2023-02-12 LAB — RAD ONC ARIA SESSION SUMMARY
Course Elapsed Days: 16
Plan Fractions Treated to Date: 13
Plan Fractions Treated to Date: 13
Plan Prescribed Dose Per Fraction: 1.8 Gy
Plan Prescribed Dose Per Fraction: 1.8 Gy
Plan Total Fractions Prescribed: 28
Plan Total Fractions Prescribed: 28
Plan Total Prescribed Dose: 50.4 Gy
Plan Total Prescribed Dose: 50.4 Gy
Reference Point Dosage Given to Date: 23.4 Gy
Reference Point Dosage Given to Date: 23.4 Gy
Reference Point Session Dosage Given: 1.8 Gy
Reference Point Session Dosage Given: 1.8 Gy
Session Number: 13

## 2023-02-13 ENCOUNTER — Other Ambulatory Visit: Payer: Self-pay

## 2023-02-13 ENCOUNTER — Ambulatory Visit
Admission: RE | Admit: 2023-02-13 | Discharge: 2023-02-13 | Disposition: A | Discharge status: Home / Self Care | Payer: 59 | Source: Ambulatory Visit | Attending: Radiation Oncology | Admitting: Radiation Oncology

## 2023-02-13 ENCOUNTER — Ambulatory Visit: Payer: 59

## 2023-02-13 DIAGNOSIS — Z5112 Encounter for antineoplastic immunotherapy: Secondary | ICD-10-CM | POA: Diagnosis not present

## 2023-02-13 LAB — RAD ONC ARIA SESSION SUMMARY
Course Elapsed Days: 17
Plan Fractions Treated to Date: 14
Plan Fractions Treated to Date: 14
Plan Prescribed Dose Per Fraction: 1.8 Gy
Plan Prescribed Dose Per Fraction: 1.8 Gy
Plan Total Fractions Prescribed: 28
Plan Total Fractions Prescribed: 28
Plan Total Prescribed Dose: 50.4 Gy
Plan Total Prescribed Dose: 50.4 Gy
Reference Point Dosage Given to Date: 25.2 Gy
Reference Point Dosage Given to Date: 25.2 Gy
Reference Point Session Dosage Given: 1.8 Gy
Reference Point Session Dosage Given: 1.8 Gy
Session Number: 14

## 2023-02-16 ENCOUNTER — Other Ambulatory Visit: Payer: Self-pay

## 2023-02-16 ENCOUNTER — Ambulatory Visit
Admission: RE | Admit: 2023-02-16 | Discharge: 2023-02-16 | Disposition: A | Discharge status: Home / Self Care | Payer: 59 | Source: Ambulatory Visit | Attending: Radiation Oncology | Admitting: Radiation Oncology

## 2023-02-16 ENCOUNTER — Ambulatory Visit: Payer: 59

## 2023-02-16 DIAGNOSIS — Z5112 Encounter for antineoplastic immunotherapy: Secondary | ICD-10-CM | POA: Diagnosis not present

## 2023-02-16 LAB — RAD ONC ARIA SESSION SUMMARY
Course Elapsed Days: 20
Plan Fractions Treated to Date: 15
Plan Fractions Treated to Date: 15
Plan Prescribed Dose Per Fraction: 1.8 Gy
Plan Prescribed Dose Per Fraction: 1.8 Gy
Plan Total Fractions Prescribed: 28
Plan Total Fractions Prescribed: 28
Plan Total Prescribed Dose: 50.4 Gy
Plan Total Prescribed Dose: 50.4 Gy
Reference Point Dosage Given to Date: 27 Gy
Reference Point Dosage Given to Date: 27 Gy
Reference Point Session Dosage Given: 1.8 Gy
Reference Point Session Dosage Given: 1.8 Gy
Session Number: 15

## 2023-02-17 ENCOUNTER — Ambulatory Visit: Payer: 59

## 2023-02-17 ENCOUNTER — Other Ambulatory Visit: Payer: Self-pay

## 2023-02-17 ENCOUNTER — Ambulatory Visit
Admission: RE | Admit: 2023-02-17 | Discharge: 2023-02-17 | Disposition: A | Discharge status: Home / Self Care | Payer: 59 | Source: Ambulatory Visit | Attending: Radiation Oncology | Admitting: Radiation Oncology

## 2023-02-17 DIAGNOSIS — Z5112 Encounter for antineoplastic immunotherapy: Secondary | ICD-10-CM | POA: Diagnosis not present

## 2023-02-17 LAB — RAD ONC ARIA SESSION SUMMARY
Course Elapsed Days: 21
Plan Fractions Treated to Date: 16
Plan Fractions Treated to Date: 16
Plan Prescribed Dose Per Fraction: 1.8 Gy
Plan Prescribed Dose Per Fraction: 1.8 Gy
Plan Total Fractions Prescribed: 28
Plan Total Fractions Prescribed: 28
Plan Total Prescribed Dose: 50.4 Gy
Plan Total Prescribed Dose: 50.4 Gy
Reference Point Dosage Given to Date: 28.8 Gy
Reference Point Dosage Given to Date: 28.8 Gy
Reference Point Session Dosage Given: 1.8 Gy
Reference Point Session Dosage Given: 1.8 Gy
Session Number: 16

## 2023-02-18 ENCOUNTER — Ambulatory Visit
Admission: RE | Admit: 2023-02-18 | Discharge: 2023-02-18 | Disposition: A | Discharge status: Home / Self Care | Payer: 59 | Source: Ambulatory Visit | Attending: Radiation Oncology | Admitting: Radiation Oncology

## 2023-02-18 ENCOUNTER — Other Ambulatory Visit: Payer: Self-pay

## 2023-02-18 ENCOUNTER — Ambulatory Visit: Payer: 59

## 2023-02-18 DIAGNOSIS — Z5112 Encounter for antineoplastic immunotherapy: Secondary | ICD-10-CM | POA: Diagnosis not present

## 2023-02-18 LAB — RAD ONC ARIA SESSION SUMMARY
Course Elapsed Days: 22
Plan Fractions Treated to Date: 17
Plan Fractions Treated to Date: 17
Plan Prescribed Dose Per Fraction: 1.8 Gy
Plan Prescribed Dose Per Fraction: 1.8 Gy
Plan Total Fractions Prescribed: 28
Plan Total Fractions Prescribed: 28
Plan Total Prescribed Dose: 50.4 Gy
Plan Total Prescribed Dose: 50.4 Gy
Reference Point Dosage Given to Date: 30.6 Gy
Reference Point Dosage Given to Date: 30.6 Gy
Reference Point Session Dosage Given: 1.8 Gy
Reference Point Session Dosage Given: 1.8 Gy
Session Number: 17

## 2023-02-19 ENCOUNTER — Inpatient Hospital Stay: Payer: 59 | Attending: Hematology and Oncology | Admitting: Hematology and Oncology

## 2023-02-19 ENCOUNTER — Inpatient Hospital Stay: Payer: 59

## 2023-02-19 ENCOUNTER — Other Ambulatory Visit: Payer: Self-pay

## 2023-02-19 ENCOUNTER — Ambulatory Visit: Payer: 59

## 2023-02-19 ENCOUNTER — Ambulatory Visit
Admission: RE | Admit: 2023-02-19 | Discharge: 2023-02-19 | Disposition: A | Discharge status: Home / Self Care | Payer: 59 | Source: Ambulatory Visit | Attending: Radiation Oncology | Admitting: Radiation Oncology

## 2023-02-19 VITALS — BP 130/77 | HR 74 | Temp 97.5°F | Resp 18 | Ht 68.0 in | Wt 164.1 lb

## 2023-02-19 DIAGNOSIS — Z171 Estrogen receptor negative status [ER-]: Secondary | ICD-10-CM

## 2023-02-19 DIAGNOSIS — Z510 Encounter for antineoplastic radiation therapy: Secondary | ICD-10-CM | POA: Insufficient documentation

## 2023-02-19 DIAGNOSIS — Z7982 Long term (current) use of aspirin: Secondary | ICD-10-CM | POA: Insufficient documentation

## 2023-02-19 DIAGNOSIS — C50412 Malignant neoplasm of upper-outer quadrant of left female breast: Secondary | ICD-10-CM

## 2023-02-19 DIAGNOSIS — Z79624 Long term (current) use of inhibitors of nucleotide synthesis: Secondary | ICD-10-CM | POA: Insufficient documentation

## 2023-02-19 DIAGNOSIS — Z5112 Encounter for antineoplastic immunotherapy: Secondary | ICD-10-CM | POA: Diagnosis not present

## 2023-02-19 DIAGNOSIS — Z79899 Other long term (current) drug therapy: Secondary | ICD-10-CM | POA: Insufficient documentation

## 2023-02-19 DIAGNOSIS — Z23 Encounter for immunization: Secondary | ICD-10-CM | POA: Insufficient documentation

## 2023-02-19 LAB — RAD ONC ARIA SESSION SUMMARY
Course Elapsed Days: 23
Plan Fractions Treated to Date: 18
Plan Fractions Treated to Date: 18
Plan Prescribed Dose Per Fraction: 1.8 Gy
Plan Prescribed Dose Per Fraction: 1.8 Gy
Plan Total Fractions Prescribed: 28
Plan Total Fractions Prescribed: 28
Plan Total Prescribed Dose: 50.4 Gy
Plan Total Prescribed Dose: 50.4 Gy
Reference Point Dosage Given to Date: 32.4 Gy
Reference Point Dosage Given to Date: 32.4 Gy
Reference Point Session Dosage Given: 1.8 Gy
Reference Point Session Dosage Given: 1.8 Gy
Session Number: 18

## 2023-02-19 MED ORDER — TRASTUZUMAB-ANNS CHEMO 150 MG IV SOLR
6.0000 mg/kg | Freq: Once | INTRAVENOUS | Status: AC
  Administered 2023-02-19: 420 mg via INTRAVENOUS
  Filled 2023-02-19: qty 20

## 2023-02-19 MED ORDER — SODIUM CHLORIDE 0.9 % IV SOLN
420.0000 mg | Freq: Once | INTRAVENOUS | Status: AC
  Administered 2023-02-19: 420 mg via INTRAVENOUS
  Filled 2023-02-19: qty 14

## 2023-02-19 MED ORDER — ACETAMINOPHEN 325 MG PO TABS
650.0000 mg | ORAL_TABLET | Freq: Once | ORAL | Status: AC
  Administered 2023-02-19: 650 mg via ORAL
  Filled 2023-02-19: qty 2

## 2023-02-19 MED ORDER — DIPHENHYDRAMINE HCL 25 MG PO CAPS
25.0000 mg | ORAL_CAPSULE | Freq: Once | ORAL | Status: AC
  Administered 2023-02-19: 25 mg via ORAL
  Filled 2023-02-19: qty 1

## 2023-02-19 MED ORDER — HEPARIN SOD (PORK) LOCK FLUSH 100 UNIT/ML IV SOLN
500.0000 [IU] | Freq: Once | INTRAVENOUS | Status: AC | PRN
  Administered 2023-02-19: 500 [IU]

## 2023-02-19 MED ORDER — SODIUM CHLORIDE 0.9 % IV SOLN: Freq: Once | INTRAVENOUS | Status: AC

## 2023-02-19 NOTE — Patient Instructions (Signed)
[facility] - A DEPT OF [facility]  Discharge Instructions: Thank you for choosing [facility] to provide your oncology and hematology care.   If you have a lab appointment with the Cancer Center, please go directly to the Cancer Center and check in at the registration area.   Wear comfortable clothing and clothing appropriate for easy access to any Portacath or PICC line.   We strive to give you quality time with your provider. You may need to reschedule your appointment if you arrive late (15 or more minutes).  Arriving late affects you and other patients whose appointments are after yours.  Also, if you miss three or more appointments without notifying the office, you may be dismissed from the clinic at the provider's discretion.      For prescription refill requests, have your pharmacy contact our office and allow 72 hours for refills to be completed.    Today you received the following chemotherapy and/or immunotherapy agents: Trastuzumab, Pertuzumab      To help prevent nausea and vomiting after your treatment, we encourage you to take your nausea medication as directed.  BELOW ARE SYMPTOMS THAT SHOULD BE REPORTED IMMEDIATELY: *FEVER GREATER THAN 100.4 F (38 C) OR HIGHER *CHILLS OR SWEATING *NAUSEA AND VOMITING THAT IS NOT CONTROLLED WITH YOUR NAUSEA MEDICATION *UNUSUAL SHORTNESS OF BREATH *UNUSUAL BRUISING OR BLEEDING *URINARY PROBLEMS (pain or burning when urinating, or frequent urination) *BOWEL PROBLEMS (unusual diarrhea, constipation, pain near the anus) TENDERNESS IN MOUTH AND THROAT WITH OR WITHOUT PRESENCE OF ULCERS (sore throat, sores in mouth, or a toothache) UNUSUAL RASH, SWELLING OR PAIN  UNUSUAL VAGINAL DISCHARGE OR ITCHING   Items with * indicate a potential emergency and should be followed up as soon as possible or go to the Emergency Department if any problems should occur.  Please show the CHEMOTHERAPY ALERT CARD or  IMMUNOTHERAPY ALERT CARD at check-in to the Emergency Department and triage nurse.  Should you have questions after your visit or need to cancel or reschedule your appointment, please contact [facility] - A DEPT OF [facility]  Dept: [phone]  and follow the prompts.  Office hours are 8:00 a.m. to 4:30 p.m. Monday - Friday. Please note that voicemails left after 4:00 p.m. may not be returned until the following business day.  We are closed weekends and major holidays. You have access to a nurse at all times for urgent questions. Please call the main number to the clinic Dept: [phone] and follow the prompts.   For any non-urgent questions, you may also contact your provider using MyChart. We now offer e-Visits for anyone 68 and older to request care online for non-urgent symptoms. For details visit [email or web address].   Also download the MyChart app! Go to the app store, search "MyChart", open the app, select [facility], and log in with your MyChart username and password.

## 2023-02-19 NOTE — Progress Notes (Signed)
Patient Care Team: [name], PA-C as PCP - General (Family Medicine) [name], MD as Consulting Physician (General Surgery) [name], MD as Consulting Physician (Hematology and Oncology) Antony Blackbird, MD as Consulting Physician (Radiation Oncology) Donnelly Angelica, RN as Oncology Nurse Navigator Pershing Proud, RN as Oncology Nurse Navigator  DIAGNOSIS:  Encounter Diagnosis  Name Primary?   Malignant neoplasm of upper-outer quadrant of left breast in female, estrogen receptor negative (HCC) Yes    SUMMARY OF ONCOLOGIC HISTORY: Oncology History  Malignant neoplasm of upper-outer quadrant of left breast in female, estrogen receptor negative (HCC)  05/15/2022 Initial Diagnosis   Palpable mass in the left breast at 1 o'clock position measured 4.2 cm ultrasound-guided biopsy revealed grade 3 IDC ER/PR negative HER2 positive Ki-67 35%, left axillary lymph node: Biopsy positive   05/21/2022 Cancer Staging   Staging form: Breast, AJCC 8th Edition - Clinical stage from 05/21/2022: Stage IIB (cT2, cN1, cM0, G3, ER-, PR-, HER2+) - Signed by [name], MD on 05/21/2022 Stage prefix: Initial diagnosis Histologic grading system: 3 grade system   05/29/2022 Genetic Testing   Negative [facility] Multi-Cancer +RNA Panel.  Report date is 05/29/2022.   The Multi-Cancer + RNA Panel offered by [facility] includes sequencing and/or deletion/duplication analysis of the following 70 genes:  AIP*, ALK, APC*, ATM*, AXIN2*, BAP1*, BARD1*, BLM*, BMPR1A*, BRCA1*, BRCA2*, BRIP1*, CDC73*, CDH1*, CDK4, CDKN1B*, CDKN2A, CHEK2*, CTNNA1*, DICER1*, EPCAM (del/dup only), EGFR, FH*, FLCN*, GREM1 (promoter dup only), HOXB13, KIT, LZTR1, MAX*, MBD4, MEN1*, MET, MITF, MLH1*, MSH2*, MSH3*, MSH6*, MUTYH*, NF1*, NF2*, NTHL1*, PALB2*, PDGFRA, PMS2*, POLD1*, POLE*, POT1*, PRKAR1A*, PTCH1*, PTEN*, RAD51C*, RAD51D*, RB1*, RET, SDHA* (sequencing only), SDHAF2*, SDHB*, SDHC*, SDHD*, SMAD4*, SMARCA4*, SMARCB1*, SMARCE1*,  STK11*, SUFU*, TMEM127*, TP53*, TSC1*, TSC2*, VHL*. RNA analysis is performed for * genes.   06/05/2022 - 10/29/2022 Chemotherapy   Patient is on Treatment Plan : BREAST  Docetaxel + Carboplatin + Trastuzumab + Pertuzumab  (TCHP) q21d      11/20/2022 -  Chemotherapy   Patient is on Treatment Plan : BREAST Trastuzumab  + Pertuzumab q21d x 13 cycles     01/28/2023 - 03/13/2023 Radiation Therapy   Adjuvant radiation     CHIEF COMPLIANT: Follow-up on Herceptin and Perjeta  HISTORY OF PRESENT ILLNESS:   History of Present Illness   The patient, with a H/O breast cancer, presents with a worsening rash that developed due to radiation treatment. The rash started to worsen over the weekend and the patient reports that a steroid cream provided no relief. The patient is now planning to try cortisone and aloe for relief. The patient also mentions mental struggles with the radiation treatment, including anxiety about the treatment process and its effects. Despite these challenges, the patient reports feeling like she is cancer-free and is looking forward to the end of her radiation treatment. The patient also mentions significant weight loss, which she attributes to years of not paying attention to her diet. The patient's appetite is reported to be good. The patient also reports hair regrowth after chemotherapy and is considering coloring her hair.         ALLERGIES:  is allergic to gabapentin (once-daily) and penicillins.  MEDICATIONS:  Current Outpatient Medications  Medication Sig Dispense Refill   ALPRAZolam (XANAX) 0.5 MG tablet 1 TABLET ORALLY TWICE DAILY AS NEEDED 30 DAYS     amoxicillin-clavulanate (AUGMENTIN) 875-125 MG tablet Take 1 tablet by mouth 2 (two) times daily. 14 tablet 0   aspirin EC 81 MG tablet  Take 81 mg by mouth daily.     cholestyramine (QUESTRAN) 4 g packet Take 1 packet (4 g total) by mouth 3 (three) times daily with meals. (Patient not taking: Reported on 01/13/2023) 60 each  12   diphenoxylate-atropine (LOMOTIL) 2.5-0.025 MG tablet TAKE 1 TABLET BY MOUTH 4 TIMES A DAY AS NEEDED FOR DIARRHEA OR LOOSE STOOLS 30 tablet 3   lisinopril (ZESTRIL) 20 MG tablet Take 20 mg by mouth daily.     loratadine (CLARITIN) 10 MG tablet Take 10 mg by mouth daily as needed for allergies.     LORazepam (ATIVAN) 0.5 MG tablet Take 1 tablet (0.5 mg total) by mouth daily as needed for anxiety. 30 tablet 0   omeprazole (PRILOSEC) 20 MG capsule Take by mouth.     rosuvastatin (CRESTOR) 20 MG tablet Take 20 mg by mouth daily.     sertraline (ZOLOFT) 100 MG tablet Take 100 mg by mouth daily.     triamcinolone ointment (KENALOG) 0.5 % Apply 1 Application topically 2 (two) times daily. 30 g 2   valACYclovir (VALTREX) 1000 MG tablet Take 1 tablet (1,000 mg total) by mouth 2 (two) times daily. 20 tablet 0   No current facility-administered medications for this visit.   Facility-Administered Medications Ordered in Other Visits  Medication Dose Route Frequency Provider Last Rate Last Admin   0.9 %  sodium chloride infusion   Intravenous Once [name], MD       acetaminophen (TYLENOL) tablet 650 mg  650 mg Oral Once [name], MD       diphenhydrAMINE (BENADRYL) capsule 25 mg  25 mg Oral Once [name], MD       heparin lock flush 100 unit/mL  250 Units Intracatheter PRN [name], MD       heparin lock flush 100 unit/mL  500 Units Intracatheter Once PRN [name], MD       pertuzumab (PERJETA) 420 mg in sodium chloride 0.9 % 250 mL chemo infusion  420 mg Intravenous Once [name], MD       sodium chloride flush (NS) 0.9 % injection 3 mL  3 mL Intracatheter PRN [name], MD       trastuzumab-anns (KANJINTI) 420 mg in sodium chloride 0.9 % 250 mL chemo infusion  6 mg/kg (Treatment Plan Recorded) Intravenous Once [name], MD        PHYSICAL EXAMINATION: ECOG PERFORMANCE STATUS: 1 - Symptomatic but completely ambulatory  Vitals:   02/19/23 1406  BP: 130/77   Pulse: 74  Resp: 18  Temp: (!) 97.5 F (36.4 C)  SpO2: 100%   Filed Weights   02/19/23 1406  Weight: 164 lb 1.6 oz (74.4 kg)    Physical Exam   SKIN: Rash present in radiation field.       LABORATORY DATA:  I have reviewed the data as listed    Latest Ref Rng & Units 01/06/2023    1:46 PM 12/15/2022    8:14 AM 11/20/2022   10:21 AM  CMP  Glucose 70 - 99 mg/dL 161  98  096   BUN 6 - 20 mg/dL 24  25  16    Creatinine 0.44 - 1.00 mg/dL 0.45  4.09  8.11   Sodium 135 - 145 mmol/L 142  142  141   Potassium 3.5 - 5.1 mmol/L 3.9  3.7  3.6   Chloride 98 - 111 mmol/L 108  109  106   CO2 22 - 32 mmol/L 28  27  29   Calcium 8.9 - 10.3 mg/dL 9.3  9.5  8.8   Total Protein 6.5 - 8.1 g/dL 6.5  6.6  6.3   Total Bilirubin 0.3 - 1.2 mg/dL 0.3  0.3  0.4   Alkaline Phos 38 - 126 U/L 73  71  85   AST 15 - 41 U/L 17  18  15    ALT 0 - 44 U/L 18  18  15      Lab Results  Component Value Date   WBC 6.2 01/06/2023   HGB 10.4 (L) 01/06/2023   HCT 31.5 (L) 01/06/2023   MCV 100.3 (H) 01/06/2023   PLT 235 01/06/2023   NEUTROABS 2.9 01/06/2023    ASSESSMENT & PLAN:  Malignant neoplasm of upper-outer quadrant of left breast in female, 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   Treatment plan: 1. Neoadjuvant chemotherapy with TCH Perjeta 6 cycles completed 09/18/2022 (Taxotere switched with Abraxane) followed by Herceptin Perjeta maintenance versus Kadcyla maintenance (based on response to neoadjuvant chemo) for 1 year 2. 11/04/2022: Left lumpectomy: No residual invasive cancer identified.  0/4 lymph nodes negative pathologic complete response 3. Followed by adjuvant radiation therapy started 01/28/2023 ------------------------------------------------------------------------ Current treatment: Herceptin Perjeta maintenance to  complete 06/04/2023  Toxicities: Severe anemia: Previously received blood transfusion Post-chemotherapy infection at incision site 2. Chemo-induced peripheral neuropathy  Adjuvant radiation started 01/28/2023-03/13/2023 ------------------------------------- Assessment and Plan    Radiation Dermatitis Severe rash in the radiation field. Topical corticosteroids and aloe vera have been ineffective. -Continue cortisone and aloe vera application. -Consider systemic corticosteroids if rash worsens.  Breast Cancer Undergoing radiation therapy with significant distress due to the treatment environment and side effects. Patient reports feeling cancer-free. -Continue radiation therapy until completion on March 13, 2023. -Continue immunotherapy until June 04, 2023. -Plan for circulating tumor DNA testing every six months for early detection of recurrence. -Plan for mammogram six months post-radiation.  General Health Maintenance -Continue to monitor weight loss and ensure adequate nutrition. -Encourage maintenance of mental health and stress management. -Follow-up regularly to monitor progress and manage side effects of treatment.          No orders of the defined types were placed in this encounter.  The patient has a good understanding of the overall plan. she agrees with it. she will call with any problems that may develop before the next visit here. Total time spent: 30 mins including face to face time and time spent for planning, charting and co-ordination of care   Tamsen Meek, MD 02/19/23

## 2023-02-19 NOTE — Assessment & Plan Note (Signed)
stage IIB ER/PR negative, HER2 positive breast cancer here today for evaluation and follow-up prior to receiving cycle 6 of neoadjuvant Abraxane, carboplatin, Herceptin, and Perjeta.   Treatment plan: 1. Neoadjuvant chemotherapy with TCH Perjeta 6 cycles completed 09/18/2022 (Taxotere switched with Abraxane) followed by Herceptin Perjeta maintenance versus Kadcyla maintenance (based on response to neoadjuvant chemo) for 1 year 2. 11/04/2022: Left lumpectomy: No residual invasive cancer identified.  0/4 lymph nodes negative pathologic complete response 3. Followed by adjuvant radiation therapy started 01/28/2023 ------------------------------------------------------------------------ Current treatment: Herceptin Perjeta maintenance to complete 08/06/2023  Toxicities: Severe anemia: Previously received blood transfusion Post-chemotherapy infection at incision site 2. Chemo-induced peripheral neuropathy  Adjuvant radiation started 01/28/2023-03/13/2023

## 2023-02-19 NOTE — Progress Notes (Signed)
Patient declined post-pertuzumab observation.  Tolerated treatment well without incident.  Ambulated to lobby.

## 2023-02-20 ENCOUNTER — Ambulatory Visit: Payer: 59

## 2023-02-20 ENCOUNTER — Ambulatory Visit
Admission: RE | Admit: 2023-02-20 | Discharge: 2023-02-20 | Disposition: A | Discharge status: Home / Self Care | Payer: 59 | Source: Ambulatory Visit | Attending: Radiation Oncology | Admitting: Radiation Oncology

## 2023-02-20 ENCOUNTER — Other Ambulatory Visit: Payer: Self-pay

## 2023-02-20 DIAGNOSIS — Z5112 Encounter for antineoplastic immunotherapy: Secondary | ICD-10-CM | POA: Diagnosis not present

## 2023-02-20 LAB — RAD ONC ARIA SESSION SUMMARY
Course Elapsed Days: 24
Plan Fractions Treated to Date: 19
Plan Fractions Treated to Date: 19
Plan Prescribed Dose Per Fraction: 1.8 Gy
Plan Prescribed Dose Per Fraction: 1.8 Gy
Plan Total Fractions Prescribed: 28
Plan Total Fractions Prescribed: 28
Plan Total Prescribed Dose: 50.4 Gy
Plan Total Prescribed Dose: 50.4 Gy
Reference Point Dosage Given to Date: 34.2 Gy
Reference Point Dosage Given to Date: 34.2 Gy
Reference Point Session Dosage Given: 1.8 Gy
Reference Point Session Dosage Given: 1.8 Gy
Session Number: 19

## 2023-02-23 ENCOUNTER — Other Ambulatory Visit: Payer: Self-pay

## 2023-02-23 ENCOUNTER — Ambulatory Visit: Payer: 59

## 2023-02-23 ENCOUNTER — Ambulatory Visit
Admission: RE | Admit: 2023-02-23 | Discharge: 2023-02-23 | Disposition: A | Discharge status: Home / Self Care | Payer: 59 | Source: Ambulatory Visit | Attending: Radiation Oncology | Admitting: Radiation Oncology

## 2023-02-23 DIAGNOSIS — Z5112 Encounter for antineoplastic immunotherapy: Secondary | ICD-10-CM | POA: Diagnosis not present

## 2023-02-23 LAB — RAD ONC ARIA SESSION SUMMARY
Course Elapsed Days: 27
Plan Fractions Treated to Date: 20
Plan Fractions Treated to Date: 20
Plan Prescribed Dose Per Fraction: 1.8 Gy
Plan Prescribed Dose Per Fraction: 1.8 Gy
Plan Total Fractions Prescribed: 28
Plan Total Fractions Prescribed: 28
Plan Total Prescribed Dose: 50.4 Gy
Plan Total Prescribed Dose: 50.4 Gy
Reference Point Dosage Given to Date: 36 Gy
Reference Point Dosage Given to Date: 36 Gy
Reference Point Session Dosage Given: 1.8 Gy
Reference Point Session Dosage Given: 1.8 Gy
Session Number: 20

## 2023-02-24 ENCOUNTER — Other Ambulatory Visit: Payer: Self-pay

## 2023-02-24 ENCOUNTER — Ambulatory Visit
Admission: RE | Admit: 2023-02-24 | Discharge: 2023-02-24 | Disposition: A | Discharge status: Home / Self Care | Payer: 59 | Source: Ambulatory Visit | Attending: Radiation Oncology | Admitting: Radiation Oncology

## 2023-02-24 DIAGNOSIS — Z5112 Encounter for antineoplastic immunotherapy: Secondary | ICD-10-CM | POA: Diagnosis not present

## 2023-02-24 LAB — RAD ONC ARIA SESSION SUMMARY
Course Elapsed Days: 28
Plan Fractions Treated to Date: 21
Plan Fractions Treated to Date: 21
Plan Prescribed Dose Per Fraction: 1.8 Gy
Plan Prescribed Dose Per Fraction: 1.8 Gy
Plan Total Fractions Prescribed: 28
Plan Total Fractions Prescribed: 28
Plan Total Prescribed Dose: 50.4 Gy
Plan Total Prescribed Dose: 50.4 Gy
Reference Point Dosage Given to Date: 37.8 Gy
Reference Point Dosage Given to Date: 37.8 Gy
Reference Point Session Dosage Given: 1.8 Gy
Reference Point Session Dosage Given: 1.8 Gy
Session Number: 21

## 2023-02-25 ENCOUNTER — Other Ambulatory Visit: Payer: Self-pay

## 2023-02-25 ENCOUNTER — Ambulatory Visit
Admission: RE | Admit: 2023-02-25 | Discharge: 2023-02-25 | Disposition: A | Discharge status: Home / Self Care | Payer: 59 | Source: Ambulatory Visit | Attending: Radiation Oncology | Admitting: Radiation Oncology

## 2023-02-25 DIAGNOSIS — Z5112 Encounter for antineoplastic immunotherapy: Secondary | ICD-10-CM | POA: Diagnosis not present

## 2023-02-25 LAB — RAD ONC ARIA SESSION SUMMARY
Course Elapsed Days: 29
Plan Fractions Treated to Date: 22
Plan Fractions Treated to Date: 22
Plan Prescribed Dose Per Fraction: 1.8 Gy
Plan Prescribed Dose Per Fraction: 1.8 Gy
Plan Total Fractions Prescribed: 28
Plan Total Fractions Prescribed: 28
Plan Total Prescribed Dose: 50.4 Gy
Plan Total Prescribed Dose: 50.4 Gy
Reference Point Dosage Given to Date: 39.6 Gy
Reference Point Dosage Given to Date: 39.6 Gy
Reference Point Session Dosage Given: 1.8 Gy
Reference Point Session Dosage Given: 1.8 Gy
Session Number: 22

## 2023-02-26 ENCOUNTER — Ambulatory Visit
Admission: RE | Admit: 2023-02-26 | Discharge: 2023-02-26 | Disposition: A | Discharge status: Home / Self Care | Payer: 59 | Source: Ambulatory Visit | Attending: Radiation Oncology | Admitting: Radiation Oncology

## 2023-02-26 ENCOUNTER — Other Ambulatory Visit: Payer: Self-pay

## 2023-02-26 DIAGNOSIS — Z5112 Encounter for antineoplastic immunotherapy: Secondary | ICD-10-CM | POA: Diagnosis not present

## 2023-02-26 LAB — RAD ONC ARIA SESSION SUMMARY
Course Elapsed Days: 30
Plan Fractions Treated to Date: 23
Plan Fractions Treated to Date: 23
Plan Prescribed Dose Per Fraction: 1.8 Gy
Plan Prescribed Dose Per Fraction: 1.8 Gy
Plan Total Fractions Prescribed: 28
Plan Total Fractions Prescribed: 28
Plan Total Prescribed Dose: 50.4 Gy
Plan Total Prescribed Dose: 50.4 Gy
Reference Point Dosage Given to Date: 41.4 Gy
Reference Point Dosage Given to Date: 41.4 Gy
Reference Point Session Dosage Given: 1.8 Gy
Reference Point Session Dosage Given: 1.8 Gy
Session Number: 23

## 2023-02-27 ENCOUNTER — Other Ambulatory Visit: Payer: Self-pay

## 2023-02-27 ENCOUNTER — Ambulatory Visit
Admission: RE | Admit: 2023-02-27 | Discharge: 2023-02-27 | Disposition: A | Discharge status: Home / Self Care | Payer: 59 | Source: Ambulatory Visit | Attending: Radiation Oncology | Admitting: Radiation Oncology

## 2023-02-27 DIAGNOSIS — Z5112 Encounter for antineoplastic immunotherapy: Secondary | ICD-10-CM | POA: Diagnosis not present

## 2023-02-27 LAB — RAD ONC ARIA SESSION SUMMARY
Course Elapsed Days: 31
Plan Fractions Treated to Date: 24
Plan Fractions Treated to Date: 24
Plan Prescribed Dose Per Fraction: 1.8 Gy
Plan Prescribed Dose Per Fraction: 1.8 Gy
Plan Total Fractions Prescribed: 28
Plan Total Fractions Prescribed: 28
Plan Total Prescribed Dose: 50.4 Gy
Plan Total Prescribed Dose: 50.4 Gy
Reference Point Dosage Given to Date: 43.2 Gy
Reference Point Dosage Given to Date: 43.2 Gy
Reference Point Session Dosage Given: 1.8 Gy
Reference Point Session Dosage Given: 1.8 Gy
Session Number: 24

## 2023-03-01 ENCOUNTER — Ambulatory Visit
Admission: RE | Admit: 2023-03-01 | Discharge: 2023-03-01 | Disposition: A | Discharge status: Home / Self Care | Payer: 59 | Source: Ambulatory Visit | Attending: Radiation Oncology | Admitting: Radiation Oncology

## 2023-03-01 ENCOUNTER — Other Ambulatory Visit: Payer: Self-pay

## 2023-03-01 DIAGNOSIS — Z5112 Encounter for antineoplastic immunotherapy: Secondary | ICD-10-CM | POA: Diagnosis not present

## 2023-03-01 LAB — RAD ONC ARIA SESSION SUMMARY
Course Elapsed Days: 33
Plan Fractions Treated to Date: 25
Plan Fractions Treated to Date: 25
Plan Prescribed Dose Per Fraction: 1.8 Gy
Plan Prescribed Dose Per Fraction: 1.8 Gy
Plan Total Fractions Prescribed: 28
Plan Total Fractions Prescribed: 28
Plan Total Prescribed Dose: 50.4 Gy
Plan Total Prescribed Dose: 50.4 Gy
Reference Point Dosage Given to Date: 45 Gy
Reference Point Dosage Given to Date: 45 Gy
Reference Point Session Dosage Given: 1.8 Gy
Reference Point Session Dosage Given: 1.8 Gy
Session Number: 25

## 2023-03-02 ENCOUNTER — Ambulatory Visit: Payer: 59

## 2023-03-02 ENCOUNTER — Other Ambulatory Visit: Payer: Self-pay

## 2023-03-02 ENCOUNTER — Ambulatory Visit
Admission: RE | Admit: 2023-03-02 | Discharge: 2023-03-02 | Disposition: A | Discharge status: Home / Self Care | Payer: 59 | Source: Ambulatory Visit | Attending: Radiation Oncology | Admitting: Radiation Oncology

## 2023-03-02 DIAGNOSIS — Z5112 Encounter for antineoplastic immunotherapy: Secondary | ICD-10-CM | POA: Diagnosis not present

## 2023-03-02 LAB — RAD ONC ARIA SESSION SUMMARY
Course Elapsed Days: 34
Plan Fractions Treated to Date: 26
Plan Fractions Treated to Date: 26
Plan Prescribed Dose Per Fraction: 1.8 Gy
Plan Prescribed Dose Per Fraction: 1.8 Gy
Plan Total Fractions Prescribed: 28
Plan Total Fractions Prescribed: 28
Plan Total Prescribed Dose: 50.4 Gy
Plan Total Prescribed Dose: 50.4 Gy
Reference Point Dosage Given to Date: 46.8 Gy
Reference Point Dosage Given to Date: 46.8 Gy
Reference Point Session Dosage Given: 1.8 Gy
Reference Point Session Dosage Given: 1.8 Gy
Session Number: 26

## 2023-03-03 ENCOUNTER — Ambulatory Visit
Admission: RE | Admit: 2023-03-03 | Discharge: 2023-03-03 | Disposition: A | Discharge status: Home / Self Care | Payer: 59 | Source: Ambulatory Visit | Attending: Radiation Oncology | Admitting: Radiation Oncology

## 2023-03-03 ENCOUNTER — Other Ambulatory Visit: Payer: Self-pay

## 2023-03-03 ENCOUNTER — Ambulatory Visit: Payer: 59 | Admitting: Radiation Oncology

## 2023-03-03 DIAGNOSIS — Z5112 Encounter for antineoplastic immunotherapy: Secondary | ICD-10-CM | POA: Diagnosis not present

## 2023-03-03 LAB — RAD ONC ARIA SESSION SUMMARY
Course Elapsed Days: 35
Plan Fractions Treated to Date: 27
Plan Fractions Treated to Date: 27
Plan Prescribed Dose Per Fraction: 1.8 Gy
Plan Prescribed Dose Per Fraction: 1.8 Gy
Plan Total Fractions Prescribed: 28
Plan Total Fractions Prescribed: 28
Plan Total Prescribed Dose: 50.4 Gy
Plan Total Prescribed Dose: 50.4 Gy
Reference Point Dosage Given to Date: 48.6 Gy
Reference Point Dosage Given to Date: 48.6 Gy
Reference Point Session Dosage Given: 1.8 Gy
Reference Point Session Dosage Given: 1.8 Gy
Session Number: 27

## 2023-03-04 ENCOUNTER — Ambulatory Visit
Admission: RE | Admit: 2023-03-04 | Discharge: 2023-03-04 | Disposition: A | Discharge status: Home / Self Care | Payer: 59 | Source: Ambulatory Visit | Attending: Radiation Oncology | Admitting: Radiation Oncology

## 2023-03-04 ENCOUNTER — Other Ambulatory Visit: Payer: Self-pay

## 2023-03-04 DIAGNOSIS — Z5112 Encounter for antineoplastic immunotherapy: Secondary | ICD-10-CM | POA: Diagnosis not present

## 2023-03-04 LAB — RAD ONC ARIA SESSION SUMMARY
Course Elapsed Days: 36
Plan Fractions Treated to Date: 28
Plan Fractions Treated to Date: 28
Plan Prescribed Dose Per Fraction: 1.8 Gy
Plan Prescribed Dose Per Fraction: 1.8 Gy
Plan Total Fractions Prescribed: 28
Plan Total Fractions Prescribed: 28
Plan Total Prescribed Dose: 50.4 Gy
Plan Total Prescribed Dose: 50.4 Gy
Reference Point Dosage Given to Date: 50.4 Gy
Reference Point Dosage Given to Date: 50.4 Gy
Reference Point Session Dosage Given: 1.8 Gy
Reference Point Session Dosage Given: 1.8 Gy
Session Number: 28

## 2023-03-09 ENCOUNTER — Ambulatory Visit: Payer: 59

## 2023-03-09 ENCOUNTER — Ambulatory Visit
Admission: RE | Admit: 2023-03-09 | Discharge: 2023-03-09 | Disposition: A | Discharge status: Home / Self Care | Payer: 59 | Source: Ambulatory Visit | Attending: Radiation Oncology | Admitting: Radiation Oncology

## 2023-03-09 ENCOUNTER — Other Ambulatory Visit: Payer: Self-pay

## 2023-03-09 ENCOUNTER — Other Ambulatory Visit: Payer: Self-pay | Admitting: Hematology and Oncology

## 2023-03-09 DIAGNOSIS — C50412 Malignant neoplasm of upper-outer quadrant of left female breast: Secondary | ICD-10-CM | POA: Diagnosis present

## 2023-03-09 DIAGNOSIS — Z79899 Other long term (current) drug therapy: Secondary | ICD-10-CM | POA: Diagnosis not present

## 2023-03-09 DIAGNOSIS — Z79624 Long term (current) use of inhibitors of nucleotide synthesis: Secondary | ICD-10-CM | POA: Diagnosis not present

## 2023-03-09 DIAGNOSIS — Z171 Estrogen receptor negative status [ER-]: Secondary | ICD-10-CM | POA: Insufficient documentation

## 2023-03-09 DIAGNOSIS — Z5112 Encounter for antineoplastic immunotherapy: Secondary | ICD-10-CM | POA: Diagnosis present

## 2023-03-09 DIAGNOSIS — Z923 Personal history of irradiation: Secondary | ICD-10-CM | POA: Diagnosis not present

## 2023-03-09 DIAGNOSIS — Z7982 Long term (current) use of aspirin: Secondary | ICD-10-CM | POA: Diagnosis not present

## 2023-03-09 LAB — RAD ONC ARIA SESSION SUMMARY
Course Elapsed Days: 41
Plan Fractions Treated to Date: 1
Plan Prescribed Dose Per Fraction: 2 Gy
Plan Total Fractions Prescribed: 5
Plan Total Prescribed Dose: 10 Gy
Reference Point Dosage Given to Date: 2 Gy
Reference Point Session Dosage Given: 2 Gy
Session Number: 29

## 2023-03-09 NOTE — Therapy (Signed)
OUTPATIENT PHYSICAL THERAPY BREAST CANCER POST OP FOLLOW UP   Patient Name: Erika [name] MRN: [id number] DOB:20-Sep-1960, 62 y.o., female Today's Date: 03/10/2023  END OF SESSION:  PT End of Session - 03/10/23 1547     Visit Number 1    Number of Visits 9    Date for PT Re-Evaluation 05/05/23    PT Start Time 1503    PT Stop Time 1547    PT Time Calculation (min) 44 min    Activity Tolerance Patient tolerated treatment well    Behavior During Therapy [facility] for tasks assessed/performed             Past Medical History:  Diagnosis Date   Anxiety    Colitis 04/07/2008   seen on CT (at ER visit)   Depression    Family history of adverse reaction to anesthesia    mother with h/o nausea   GERD (gastroesophageal reflux disease)    Hyperplastic colon polyp    Hypertension    Liver hemangioma 04/07/2008   seen on CT   Past Surgical History:  Procedure Laterality Date   "freezing of cervix"     ABDOMINAL HYSTERECTOMY     BREAST BIOPSY Left 05/15/2022   [place] LT BREAST BX W LOC DEV 1ST LESION IMG BX SPEC US GUIDE 05/15/2022 [facility]   BREAST BIOPSY  11/03/2022   MM LT RADIOACTIVE SEED LOC MAMMO GUIDE 11/03/2022 [facility]   BREAST BIOPSY  11/03/2022   MM LT RADIOACTIVE SEED EA ADD LESION LOC MAMMO GUIDE 11/03/2022 [facility]   BREAST BIOPSY Left 11/03/2022   [place] LT RADIOACTIVE SEED LOC 11/03/2022 [facility]   BREAST LUMPECTOMY WITH RADIOACTIVE SEED AND SENTINEL LYMPH NODE BIOPSY Left 11/04/2022   Procedure: LEFT BREAST SEED BRACKETED LUMPECTOMY, LEFT SENTINEL LYMPH NODE BIOPSY;  Surgeon: [name], MD;  Location: [facility];  Service: General;  Laterality: Left;  PEC BLOCK   BREAST RECONSTRUCTION Left 11/14/2022   Procedure: LEFT  ONCOPLASTIC BREAST RECONSTRUCTION;  Surgeon: Glenna Fellows, MD;  Location: [facility];  Service: Plastics;  Laterality: Left;   BREAST REDUCTION SURGERY Right 11/14/2022   Procedure:  MAMMARY REDUCTION  (BREAST);  Surgeon: Glenna Fellows, MD;  Location: [facility];  Service: Plastics;  Laterality: Right;   CESAREAN SECTION     NASAL SINUS SURGERY     PORTACATH PLACEMENT N/A 05/29/2022   Procedure: INSERTION PORT-A-CATH;  Surgeon: [name], MD;  Location: [facility];  Service: General;  Laterality: N/A;   RADIOACTIVE SEED GUIDED AXILLARY SENTINEL LYMPH NODE Left 11/04/2022   Procedure: SEED TARGETED LEFT AXILLARY LYMPH NODE EXCISIONAL BIOPSY;  Surgeon: [name], MD;  Location:  SURGERY CENTER;  Service: General;  Laterality: Left;   right kneecap surgery +     Patient Active Problem List   Diagnosis Date Noted   Port-A-Cath in place 06/03/2022   Genetic testing 05/30/2022   Therapeutic drug monitoring 05/27/2022   Malignant neoplasm of upper-outer quadrant of left breast in female, estrogen receptor negative (HCC) 05/19/2022    PCP: [name], PA-C  REFERRING PROVIDER: [name], MD  REFERRING DIAG: L breast cancer  THERAPY DIAG:  Lymphedema, not elsewhere classified  Disorder of the skin and subcutaneous tissue related to radiation, unspecified  Unsteadiness on feet  Abnormal posture  Malignant neoplasm of upper-outer quadrant of left breast in female, estrogen receptor negative (HCC)  Rationale for Evaluation and Treatment: Rehabilitation  ONSET DATE: 05/08/22  SUBJECTIVE:  SUBJECTIVE STATEMENT: I had an infection in the area of my scar and it kept oozing so I had to keep cancelling my appointments. I was on antibiotics twice. It has been going on for the past 90 days. It is better now. It is no longer infected but it is swollen. She had to draw lots of fluid off twice. If there was a side effect I ended up with it. I am done with  chemo but I have 3 more radiation treatments.   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 It measures 4.2 cm and is located in the upper outer quadrant. It is ER/PR negative and HER2 positive with a Ki67 of 35%. 11/04/22- L breast lumpectomy and SLNB 0/4, 11/14/22- bilateral breast reduction. Had completed chemo, has 3 more radiation sessions as of 03/10/23 and will complete immunotherapy in Feb 2025  PATIENT GOALS:  Reassess how my recovery is going related to arm function, pain, and swelling.  PAIN:  Are you having pain? 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   OBJECTIVE:   PATIENT SURVEYS:  QUICK DASH:  Neldon Mc - 03/10/23 0001     Open a tight or new jar No difficulty    Do heavy household chores (wash walls, wash floors) No difficulty    Carry a shopping bag or briefcase No difficulty    Wash your back No difficulty    Use a knife to cut food No difficulty    Recreational activities in which you take some force or impact through your arm, shoulder, or hand (golf, hammering, tennis) No difficulty    During the past week, to what extent has your arm, shoulder or hand problem interfered with your normal social activities with family, friends, neighbors, or groups? Not at all    During the past week, to what extent has your arm, shoulder or hand problem limited your work or other regular daily activities Not at all    Arm, shoulder, or hand pain. None    Tingling (pins and needles) in your arm, shoulder, or hand None    Difficulty Sleeping No difficulty    DASH Score 0 %              OBSERVATIONS:   Media Information  Document Information  Photos  L wound healing   03/10/2023 15:38  Attached To:  Outpatient Rehab on 03/10/23 with Jeanella Craze, Remi Deter, PT  Source Information  [place], Remi Deter, [place]  [facility]  Document History    Healing wound on L breast - mild lymphedema present in L inferior and lateral breast extending to axillary area  POSTURE:  Forward head and rounded shoulders posture   UPPER EXTREMITY AROM/PROM:   A/PROM RIGHT   eval   RIGHT 03/10/23  Shoulder extension 58 85  Shoulder flexion 157 165  Shoulder abduction 162 180  Shoulder internal rotation 72 81  Shoulder external rotation 90 95                          (Blank rows = not tested)   A/PROM LEFT   eval  LEFT 03/10/23  Shoulder extension 66 86  Shoulder flexion 140 170  Shoulder abduction 163 175  Shoulder internal rotation 67 75  Shoulder external rotation 82 84                          (Blank rows = not tested)  SLS: 11 sec on L, 4 sec; tandem stance initially difficult to obtain but then pt able to hold with sway, standing with narrow base of support and eyes closed - able to but with increased sway in all directions  CERVICAL AROM: All within normal limits   UPPER EXTREMITY STRENGTH: WFL   LYMPHEDEMA ASSESSMENTS:    LANDMARK RIGHT   eval  10 cm proximal to olecranon process 28.9  Olecranon process 24.8  10 cm proximal to ulnar styloid process 20.4  Just proximal to ulnar styloid process 15.4  Across hand at thumb web space 18.3  At base of 2nd digit 5.8  (Blank rows = not tested)   LANDMARK LEFT   eval LEFT 03/10/23  10 cm proximal to olecranon process 28.3 24.6  Olecranon process 25 24.1  10 cm proximal to ulnar styloid process 19.4 17.3  Just proximal to ulnar styloid process 15.2 14.7  Across hand at thumb web space 18.2 18  At base of 2nd digit 5.8 5.5  (Blank rows = not tested)  Surgery type/Date: 11/04/22- L breast lumpectomy and SLNB 0/4, 11/14/22- bilateral breast reduction Number of lymph nodes removed:  0/4  Current/past treatment (chemo, radiation, hormone therapy): completed chemo, will complete radiation on 03/13/23, will complete immunotherapy in Feb 2025  Other symptoms:  Heaviness/tightness No Pain No Pitting edema No Infections Yes numerous infections Decreased scar mobility No Stemmer sign No  PATIENT EDUCATION:  Education details: ABC class, lymphedema risk reduction, holding PT until skin has healed from radiation, how chemo affects proprioception  Person educated: Patient Education method: Explanation Education comprehension: verbalized understanding  HOME EXERCISE PROGRAM: Reviewed previously given post op HEP.   ASSESSMENT:  CLINICAL IMPRESSION: Pt returns to PT after undergoing a L breast lumpectomy and SLNB (0/4) followed by bilateral breast reduction. She has completed chemotherapy and will complete radiation on 03/13/23. She has had numerous infections in the L breast and there is still an open area but it is no longer draining. Pt has lymphedema in inferior breast and lateral trunk/axillary area on the L. She has returned to baseline shoulder ROM but has been having difficulty with her balance especially with eyes closed. She would benefit from skilled PT services to decrease lymphedema and to improve her balance to decrease fall risk.   Pt will benefit from skilled therapeutic intervention to improve on the following deficits: Decreased knowledge of precautions, impaired UE functional use, pain, decreased ROM, postural dysfunction.   PT treatment/interventions: ADL/Self care home management, [phone]- PT Re-evaluation, 97110-Therapeutic exercises, 97530- Therapeutic activity, [id number]- Neuromuscular re-education, 97535- Self Care, [id number]- Manual therapy, Patient/Family education, Manual lymph drainage, Scar mobilization, and Compression bandaging   GOALS: Goals reviewed with patient? Yes  LONG TERM GOALS:  (STG=LTG)  GOALS Name Target Date  Goal status  1 Pt will  demonstrate she has regained full shoulder ROM and function post operatively compared to baselines.  Baseline: 03/10/23  MET  2 Pt will be independent in self MLD for long term management of lymphedema. 05/05/23 INITIAL  3 Pt will obtain a compression bra for long term management of lymphedema. 05/05/23 INITIAL  4 Pt will  be able to stand on L and R feet for 30 sec without UE support to decrease fall risk. 05/05/23 INITIAL     PLAN:  PT FREQUENCY/DURATION: 2x/wk for 4 wks starting 04/06/23 to allow skin to heal from radiation  PLAN FOR NEXT SESSION: begin MLD to L breast and lateral trunk and instruct pt, does she have compression bra, is wound healed, high level balance activities especially with eyes closed    [facility]  [address], [address]  [place] [address]  [phone]  After Breast Cancer Class It is recommended you attend the ABC class to be educated on lymphedema risk reduction. This class is free of charge and lasts for 1 hour. It is a 1-time class. You will need to download the TEAMS app either on your phone or computer. We will send you a link the night before or the morning of the class. You should be able to click on that link to join the class. This is not a confidential class. You don't have to turn your camera on, but other participants may be able to see your email address.  Scar massage You can begin gentle scar massage to you incision sites. Gently place one hand on the incision and move the skin (without sliding on the skin) in various directions. Do this for a few minutes and then you can gently massage either coconut oil or vitamin E cream into the scars.  Compression garment You should continue wearing your compression bra until you feel like you no longer have swelling.  Home exercise Program Continue doing the exercises you were given until you feel like you can do them without feeling any tightness at the end.   Walking  Program Studies show that 30 minutes of walking per day (fast enough to elevate your heart rate) can significantly reduce the risk of a cancer recurrence. If you can't walk due to other medical reasons, we encourage you to find another activity you could do (like a stationary bike or water exercise).  Posture After breast cancer surgery, people frequently sit with rounded shoulders posture because it puts their incisions on slack and feels better. If you sit like this and scar tissue forms in that position, you can become very tight and have pain sitting or standing with good posture. Try to be aware of your posture and sit and stand up tall to heal properly.  Follow up PT: It is recommended you return every 3 months for the first 3 years following surgery to be assessed on the SOZO machine for an L-Dex score. This helps prevent clinically significant lymphedema in 95% of patients. These follow up screens are 10 minute appointments that you are not billed for.  [facility], PT 03/10/2023, 4:04 PM

## 2023-03-10 ENCOUNTER — Ambulatory Visit: Payer: 59

## 2023-03-10 ENCOUNTER — Ambulatory Visit: Payer: 59 | Attending: General Surgery | Admitting: Physical Therapy

## 2023-03-10 ENCOUNTER — Encounter: Payer: Self-pay | Admitting: Physical Therapy

## 2023-03-10 ENCOUNTER — Ambulatory Visit
Admission: RE | Admit: 2023-03-10 | Discharge: 2023-03-10 | Disposition: A | Discharge status: Home / Self Care | Payer: 59 | Source: Ambulatory Visit | Attending: Radiation Oncology | Admitting: Radiation Oncology

## 2023-03-10 ENCOUNTER — Other Ambulatory Visit: Payer: Self-pay

## 2023-03-10 DIAGNOSIS — I890 Lymphedema, not elsewhere classified: Secondary | ICD-10-CM | POA: Diagnosis present

## 2023-03-10 DIAGNOSIS — Z5112 Encounter for antineoplastic immunotherapy: Secondary | ICD-10-CM | POA: Diagnosis not present

## 2023-03-10 DIAGNOSIS — Z171 Estrogen receptor negative status [ER-]: Secondary | ICD-10-CM | POA: Diagnosis present

## 2023-03-10 DIAGNOSIS — L599 Disorder of the skin and subcutaneous tissue related to radiation, unspecified: Secondary | ICD-10-CM | POA: Insufficient documentation

## 2023-03-10 DIAGNOSIS — R293 Abnormal posture: Secondary | ICD-10-CM | POA: Diagnosis present

## 2023-03-10 DIAGNOSIS — R2681 Unsteadiness on feet: Secondary | ICD-10-CM | POA: Insufficient documentation

## 2023-03-10 DIAGNOSIS — C50412 Malignant neoplasm of upper-outer quadrant of left female breast: Secondary | ICD-10-CM | POA: Insufficient documentation

## 2023-03-10 LAB — RAD ONC ARIA SESSION SUMMARY
Course Elapsed Days: 42
Plan Fractions Treated to Date: 2
Plan Prescribed Dose Per Fraction: 2 Gy
Plan Total Fractions Prescribed: 5
Plan Total Prescribed Dose: 10 Gy
Reference Point Dosage Given to Date: 4 Gy
Reference Point Session Dosage Given: 2 Gy
Session Number: 30

## 2023-03-11 ENCOUNTER — Other Ambulatory Visit: Payer: Self-pay

## 2023-03-11 ENCOUNTER — Ambulatory Visit
Admission: RE | Admit: 2023-03-11 | Discharge: 2023-03-11 | Disposition: A | Discharge status: Home / Self Care | Payer: 59 | Source: Ambulatory Visit | Attending: Radiation Oncology | Admitting: Radiation Oncology

## 2023-03-11 DIAGNOSIS — Z5112 Encounter for antineoplastic immunotherapy: Secondary | ICD-10-CM | POA: Diagnosis not present

## 2023-03-11 LAB — RAD ONC ARIA SESSION SUMMARY
Course Elapsed Days: 43
Plan Fractions Treated to Date: 3
Plan Prescribed Dose Per Fraction: 2 Gy
Plan Total Fractions Prescribed: 5
Plan Total Prescribed Dose: 10 Gy
Reference Point Dosage Given to Date: 6 Gy
Reference Point Session Dosage Given: 2 Gy
Session Number: 31

## 2023-03-12 ENCOUNTER — Ambulatory Visit
Admission: RE | Admit: 2023-03-12 | Discharge: 2023-03-12 | Disposition: A | Discharge status: Home / Self Care | Payer: 59 | Source: Ambulatory Visit | Attending: Radiation Oncology | Admitting: Radiation Oncology

## 2023-03-12 ENCOUNTER — Inpatient Hospital Stay: Payer: 59

## 2023-03-12 ENCOUNTER — Other Ambulatory Visit: Payer: Self-pay

## 2023-03-12 ENCOUNTER — Inpatient Hospital Stay: Payer: 59 | Attending: Hematology and Oncology | Admitting: Hematology and Oncology

## 2023-03-12 VITALS — BP 115/77 | HR 83 | Temp 98.4°F | Resp 18 | Ht 68.0 in | Wt 162.2 lb

## 2023-03-12 DIAGNOSIS — Z171 Estrogen receptor negative status [ER-]: Secondary | ICD-10-CM

## 2023-03-12 DIAGNOSIS — C50412 Malignant neoplasm of upper-outer quadrant of left female breast: Secondary | ICD-10-CM | POA: Insufficient documentation

## 2023-03-12 DIAGNOSIS — Z5112 Encounter for antineoplastic immunotherapy: Secondary | ICD-10-CM | POA: Diagnosis not present

## 2023-03-12 DIAGNOSIS — Z79899 Other long term (current) drug therapy: Secondary | ICD-10-CM | POA: Insufficient documentation

## 2023-03-12 DIAGNOSIS — Z7982 Long term (current) use of aspirin: Secondary | ICD-10-CM | POA: Insufficient documentation

## 2023-03-12 DIAGNOSIS — Z79624 Long term (current) use of inhibitors of nucleotide synthesis: Secondary | ICD-10-CM | POA: Insufficient documentation

## 2023-03-12 DIAGNOSIS — Z923 Personal history of irradiation: Secondary | ICD-10-CM | POA: Insufficient documentation

## 2023-03-12 LAB — RAD ONC ARIA SESSION SUMMARY
Course Elapsed Days: 44
Plan Fractions Treated to Date: 4
Plan Prescribed Dose Per Fraction: 2 Gy
Plan Total Fractions Prescribed: 5
Plan Total Prescribed Dose: 10 Gy
Reference Point Dosage Given to Date: 8 Gy
Reference Point Session Dosage Given: 2 Gy
Session Number: 32

## 2023-03-12 MED ORDER — ACETAMINOPHEN 325 MG PO TABS
650.0000 mg | ORAL_TABLET | Freq: Once | ORAL | Status: AC
  Administered 2023-03-12: 650 mg via ORAL
  Filled 2023-03-12: qty 2

## 2023-03-12 MED ORDER — SODIUM CHLORIDE 0.9% FLUSH
10.0000 mL | INTRAVENOUS | Status: DC | PRN
Start: 2023-03-12 — End: 2023-03-12
  Administered 2023-03-12: 10 mL

## 2023-03-12 MED ORDER — HEPARIN SOD (PORK) LOCK FLUSH 100 UNIT/ML IV SOLN
500.0000 [IU] | Freq: Once | INTRAVENOUS | Status: AC | PRN
  Administered 2023-03-12: 500 [IU]

## 2023-03-12 MED ORDER — DIPHENHYDRAMINE HCL 25 MG PO CAPS
25.0000 mg | ORAL_CAPSULE | Freq: Once | ORAL | Status: AC
  Administered 2023-03-12: 25 mg via ORAL
  Filled 2023-03-12: qty 1

## 2023-03-12 MED ORDER — SODIUM CHLORIDE 0.9 % IV SOLN: Freq: Once | INTRAVENOUS | Status: AC

## 2023-03-12 MED ORDER — SODIUM CHLORIDE 0.9 % IV SOLN
6.0000 mg/kg | Freq: Once | INTRAVENOUS | Status: AC
  Administered 2023-03-12: 420 mg via INTRAVENOUS
  Filled 2023-03-12: qty 20

## 2023-03-12 MED ORDER — SODIUM CHLORIDE 0.9 % IV SOLN
420.0000 mg | Freq: Once | INTRAVENOUS | Status: AC
  Administered 2023-03-12: 420 mg via INTRAVENOUS
  Filled 2023-03-12: qty 14

## 2023-03-12 NOTE — Progress Notes (Signed)
Patient Care Team: [name], PA-C as PCP - General (Family Medicine) [name], MD as Consulting Physician (General Surgery) [name], MD as Consulting Physician (Hematology and Oncology) Antony Blackbird, MD as Consulting Physician (Radiation Oncology) Donnelly Angelica, RN as Oncology Nurse Navigator Pershing Proud, RN as Oncology Nurse Navigator  DIAGNOSIS:  Encounter Diagnosis  Name Primary?   Malignant neoplasm of upper-outer quadrant of left breast in female, estrogen receptor negative (HCC) Yes    SUMMARY OF ONCOLOGIC HISTORY: Oncology History  Malignant neoplasm of upper-outer quadrant of left breast in female, estrogen receptor negative (HCC)  05/15/2022 Initial Diagnosis   Palpable mass in the left breast at 1 o'clock position measured 4.2 cm ultrasound-guided biopsy revealed grade 3 IDC ER/PR negative HER2 positive Ki-67 35%, left axillary lymph node: Biopsy positive   05/21/2022 Cancer Staging   Staging form: Breast, AJCC 8th Edition - Clinical stage from 05/21/2022: Stage IIB (cT2, cN1, cM0, G3, ER-, PR-, HER2+) - Signed by [name], MD on 05/21/2022 Stage prefix: Initial diagnosis Histologic grading system: 3 grade system   05/29/2022 Genetic Testing   Negative [facility] Multi-Cancer +RNA Panel.  Report date is 05/29/2022.   The Multi-Cancer + RNA Panel offered by [facility] includes sequencing and/or deletion/duplication analysis of the following 70 genes:  AIP*, ALK, APC*, ATM*, AXIN2*, BAP1*, BARD1*, BLM*, BMPR1A*, BRCA1*, BRCA2*, BRIP1*, CDC73*, CDH1*, CDK4, CDKN1B*, CDKN2A, CHEK2*, CTNNA1*, DICER1*, EPCAM (del/dup only), EGFR, FH*, FLCN*, GREM1 (promoter dup only), HOXB13, KIT, LZTR1, MAX*, MBD4, MEN1*, MET, MITF, MLH1*, MSH2*, MSH3*, MSH6*, MUTYH*, NF1*, NF2*, NTHL1*, PALB2*, PDGFRA, PMS2*, POLD1*, POLE*, POT1*, PRKAR1A*, PTCH1*, PTEN*, RAD51C*, RAD51D*, RB1*, RET, SDHA* (sequencing only), SDHAF2*, SDHB*, SDHC*, SDHD*, SMAD4*, SMARCA4*, SMARCB1*, SMARCE1*,  STK11*, SUFU*, TMEM127*, TP53*, TSC1*, TSC2*, VHL*. RNA analysis is performed for * genes.   06/05/2022 - 10/29/2022 Chemotherapy   Patient is on Treatment Plan : BREAST  Docetaxel + Carboplatin + Trastuzumab + Pertuzumab  (TCHP) q21d      11/20/2022 -  Chemotherapy   Patient is on Treatment Plan : BREAST Trastuzumab  + Pertuzumab q21d x 13 cycles     01/28/2023 - 03/13/2023 Radiation Therapy   Adjuvant radiation     CHIEF COMPLIANT: F/U on radiation and Herceptin and Perjeta  HISTORY OF PRESENT ILLNESS:   History of Present Illness   The patient, who is currently undergoing radiation treatment, presents with multiple concerns. She reports a painful mouth and tongue, which she describes as so severe that it affects her diet, limiting her to water, protein shakes, and non-acidic, non-spicy foods. She has been using a lidocaine gel provided by her radiation doctor, which she reports has been helpful in managing the pain.  In addition to the oral discomfort, the patient also reports a recurring issue with her toe, which has been oozing. Despite her efforts to manage it with soaks and ointments, the issue persists. This is the fourth occurrence of this issue, which the patient finds frustrating and attributes to her white blood cell count and overall immune system suppression from the chemotherapy.  The patient also expresses concern about the possibility of microscopic cancer cells in her body. She is eager to ensure that she is cancer-free and is interested in a program that checks blood for the presence of cancer cells.         ALLERGIES:  is allergic to gabapentin (once-daily) and penicillins.  MEDICATIONS:  Current Outpatient Medications  Medication Sig Dispense Refill   ALPRAZolam (XANAX) 0.5 MG tablet 1  TABLET ORALLY TWICE DAILY AS NEEDED 30 DAYS     amoxicillin-clavulanate (AUGMENTIN) 875-125 MG tablet Take 1 tablet by mouth 2 (two) times daily. 14 tablet 0   aspirin EC 81 MG tablet  Take 81 mg by mouth daily.     cholestyramine (QUESTRAN) 4 g packet Take 1 packet (4 g total) by mouth 3 (three) times daily with meals. (Patient not taking: Reported on 01/13/2023) 60 each 12   diphenoxylate-atropine (LOMOTIL) 2.5-0.025 MG tablet TAKE 1 TABLET BY MOUTH FOUR TIMES A DAY AS NEEDED FOR DIARRHEA/LOOSE STOOLS 30 tablet 1   lisinopril (ZESTRIL) 20 MG tablet Take 20 mg by mouth daily.     loratadine (CLARITIN) 10 MG tablet Take 10 mg by mouth daily as needed for allergies.     LORazepam (ATIVAN) 0.5 MG tablet Take 1 tablet (0.5 mg total) by mouth daily as needed for anxiety. 30 tablet 0   omeprazole (PRILOSEC) 20 MG capsule Take by mouth.     rosuvastatin (CRESTOR) 20 MG tablet Take 20 mg by mouth daily.     sertraline (ZOLOFT) 100 MG tablet Take 100 mg by mouth daily.     triamcinolone ointment (KENALOG) 0.5 % Apply 1 Application topically 2 (two) times daily. 30 g 2   valACYclovir (VALTREX) 1000 MG tablet Take 1 tablet (1,000 mg total) by mouth 2 (two) times daily. 20 tablet 0   No current facility-administered medications for this visit.   Facility-Administered Medications Ordered in Other Visits  Medication Dose Route Frequency Provider Last Rate Last Admin   heparin lock flush 100 unit/mL  250 Units Intracatheter PRN [name], MD       sodium chloride flush (NS) 0.9 % injection 3 mL  3 mL Intracatheter PRN [name], MD        PHYSICAL EXAMINATION: ECOG PERFORMANCE STATUS: 1 - Symptomatic but completely ambulatory  Vitals:   03/12/23 1410  BP: 115/77  Pulse: 83  Resp: 18  Temp: 98.4 F (36.9 C)  SpO2: 97%   Filed Weights   03/12/23 1410  Weight: 162 lb 3.2 oz (73.6 kg)    Physical Exam   SKIN: Toenail with white corner, oozing slightly.      (exam performed in the presence of a chaperone)  LABORATORY DATA:  I have reviewed the data as listed    Latest Ref Rng & Units 01/06/2023    1:46 PM 12/15/2022    8:14 AM 11/20/2022   10:21 AM  CMP  Glucose  70 - 99 mg/dL 409  98  811   BUN 6 - 20 mg/dL 24  25  16    Creatinine 0.44 - 1.00 mg/dL 9.14  7.82  9.56   Sodium 135 - 145 mmol/L 142  142  141   Potassium 3.5 - 5.1 mmol/L 3.9  3.7  3.6   Chloride 98 - 111 mmol/L 108  109  106   CO2 22 - 32 mmol/L 28  27  29    Calcium 8.9 - 10.3 mg/dL 9.3  9.5  8.8   Total Protein 6.5 - 8.1 g/dL 6.5  6.6  6.3   Total Bilirubin 0.3 - 1.2 mg/dL 0.3  0.3  0.4   Alkaline Phos 38 - 126 U/L 73  71  85   AST 15 - 41 U/L 17  18  15    ALT 0 - 44 U/L 18  18  15      Lab Results  Component Value Date   WBC 6.2 01/06/2023  HGB 10.4 (L) 01/06/2023   HCT 31.5 (L) 01/06/2023   MCV 100.3 (H) 01/06/2023   PLT 235 01/06/2023   NEUTROABS 2.9 01/06/2023    ASSESSMENT & PLAN:  Malignant neoplasm of upper-outer quadrant of left breast in female, 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   Treatment plan: 1. Neoadjuvant chemotherapy with TCH Perjeta 6 cycles completed 09/18/2022 (Taxotere switched with Abraxane) followed by Herceptin Perjeta maintenance versus Kadcyla maintenance (based on response to neoadjuvant chemo) for 1 year 2. 11/04/2022: Left lumpectomy: No residual invasive cancer identified.  0/4 lymph nodes negative pathologic complete response 3. Followed by adjuvant radiation therapy started 01/28/2023-03/13/2023 ------------------------------------------------------------------------ Current treatment: Herceptin Perjeta maintenance to complete 06/04/2023   Toxicities: Severe anemia: Previously received blood transfusion Post-chemotherapy infection at incision site 2. Chemo-induced peripheral neuropathy   Adjuvant radiation started 01/28/2023-03/13/2023 Radiation dermatitis  Plan to initiate guardant reveal MRD monitoring after HP is  completed ------------------------------------- Assessment and Plan    Radiation Dermatitis Ongoing skin reaction to radiation therapy, with oozing noted. The patient is managing it with soaking and ointments. The patient has been advised that healing should occur post-radiation. -Continue current management with soaking and ointments.  Oral Pain Painful oral lesions, possibly related to radiation therapy. The patient has been using a lidocaine gel which has provided relief. -Continue using lidocaine gel as needed for oral pain.  Cancer Surveillance The patient has completed radiation therapy and is concerned about potential microscopic cancer cells. Discussed the use of Gardant, a blood test that can detect broken DNA bits from cancer cells. -Set up Gardant blood test for after the completion of radiation therapy in February.  Toe Infection The patient has a recurrent toe infection, currently not painful or infected, but still open. The patient is managing it with soaking and ointments. -Continue current management with soaking and ointments.  General Health Maintenance -Check CBC, CMP in January.          Orders Placed This Encounter  Procedures   CBC with Differential (Cancer Center Only)    Standing Status:   Future    Standing Expiration Date:   03/11/2024   CMP (Cancer Center only)    Standing Status:   Future    Standing Expiration Date:   03/11/2024   The patient has a good understanding of the overall plan. she agrees with it. she will call with any problems that may develop before the next visit here. Total time spent: 30 mins including face to face time and time spent for planning, charting and co-ordination of care   Tamsen Meek, MD 03/12/23

## 2023-03-12 NOTE — Assessment & Plan Note (Signed)
stage IIB ER/PR negative, HER2 positive breast cancer here today for evaluation and follow-up prior to receiving cycle 6 of neoadjuvant Abraxane, carboplatin, Herceptin, and Perjeta.   Treatment plan: 1. Neoadjuvant chemotherapy with TCH Perjeta 6 cycles completed 09/18/2022 (Taxotere switched with Abraxane) followed by Herceptin Perjeta maintenance versus Kadcyla maintenance (based on response to neoadjuvant chemo) for 1 year 2. 11/04/2022: Left lumpectomy: No residual invasive cancer identified.  0/4 lymph nodes negative pathologic complete response 3. Followed by adjuvant radiation therapy started 01/28/2023-03/13/2023 ------------------------------------------------------------------------ Current treatment: Herceptin Perjeta maintenance to complete 06/04/2023   Toxicities: Severe anemia: Previously received blood transfusion Post-chemotherapy infection at incision site 2. Chemo-induced peripheral neuropathy   Adjuvant radiation started 01/28/2023-03/13/2023 Radiation dermatitis  Plan to initiate guardant reveal MRD monitoring

## 2023-03-12 NOTE — Patient Instructions (Signed)
[facility] - A DEPT OF [facility]  Discharge Instructions: Thank you for choosing [facility] to provide your oncology and hematology care.   If you have a lab appointment with the Cancer Center, please go directly to the Cancer Center and check in at the registration area.   Wear comfortable clothing and clothing appropriate for easy access to any Portacath or PICC line.   We strive to give you quality time with your provider. You may need to reschedule your appointment if you arrive late (15 or more minutes).  Arriving late affects you and other patients whose appointments are after yours.  Also, if you miss three or more appointments without notifying the office, you may be dismissed from the clinic at the provider's discretion.      For prescription refill requests, have your pharmacy contact our office and allow 72 hours for refills to be completed.    Today you received the following chemotherapy and/or immunotherapy agents: Trastuzumab & Pertuzumab      To help prevent nausea and vomiting after your treatment, we encourage you to take your nausea medication as directed.  BELOW ARE SYMPTOMS THAT SHOULD BE REPORTED IMMEDIATELY: *FEVER GREATER THAN 100.4 F (38 C) OR HIGHER *CHILLS OR SWEATING *NAUSEA AND VOMITING THAT IS NOT CONTROLLED WITH YOUR NAUSEA MEDICATION *UNUSUAL SHORTNESS OF BREATH *UNUSUAL BRUISING OR BLEEDING *URINARY PROBLEMS (pain or burning when urinating, or frequent urination) *BOWEL PROBLEMS (unusual diarrhea, constipation, pain near the anus) TENDERNESS IN MOUTH AND THROAT WITH OR WITHOUT PRESENCE OF ULCERS (sore throat, sores in mouth, or a toothache) UNUSUAL RASH, SWELLING OR PAIN  UNUSUAL VAGINAL DISCHARGE OR ITCHING   Items with * indicate a potential emergency and should be followed up as soon as possible or go to the Emergency Department if any problems should occur.  Please show the CHEMOTHERAPY ALERT CARD or  IMMUNOTHERAPY ALERT CARD at check-in to the Emergency Department and triage nurse.  Should you have questions after your visit or need to cancel or reschedule your appointment, please contact [facility] - A DEPT OF [facility]  Dept: [phone]  and follow the prompts.  Office hours are 8:00 a.m. to 4:30 p.m. Monday - Friday. Please note that voicemails left after 4:00 p.m. may not be returned until the following business day.  We are closed weekends and major holidays. You have access to a nurse at all times for urgent questions. Please call the main number to the clinic Dept: [phone] and follow the prompts.   For any non-urgent questions, you may also contact your provider using MyChart. We now offer e-Visits for anyone 90 and older to request care online for non-urgent symptoms. For details visit [email or web address].   Also download the MyChart app! Go to the app store, search "MyChart", open the app, select [place], and log in with your MyChart username and password.

## 2023-03-13 ENCOUNTER — Ambulatory Visit: Payer: 59

## 2023-03-13 ENCOUNTER — Ambulatory Visit
Admission: RE | Admit: 2023-03-13 | Discharge: 2023-03-13 | Disposition: A | Discharge status: Home / Self Care | Payer: 59 | Source: Ambulatory Visit | Attending: Radiation Oncology | Admitting: Radiation Oncology

## 2023-03-13 ENCOUNTER — Other Ambulatory Visit: Payer: Self-pay

## 2023-03-13 DIAGNOSIS — Z5112 Encounter for antineoplastic immunotherapy: Secondary | ICD-10-CM | POA: Diagnosis not present

## 2023-03-13 LAB — RAD ONC ARIA SESSION SUMMARY
Course Elapsed Days: 45
Plan Fractions Treated to Date: 5
Plan Prescribed Dose Per Fraction: 2 Gy
Plan Total Fractions Prescribed: 5
Plan Total Prescribed Dose: 10 Gy
Reference Point Dosage Given to Date: 10 Gy
Reference Point Session Dosage Given: 2 Gy
Session Number: 33

## 2023-03-16 ENCOUNTER — Ambulatory Visit: Payer: 59

## 2023-03-16 NOTE — Radiation Completion Notes (Signed)
Patient Name: Erika [name], Erika [name] MRN: [id number] Date of Birth: 1961/03/01 Referring Physician: [name], M.D. Date of Service: 2023-03-16 Radiation Oncologist: Arnette Schaumann, M.D. [facility] [place]                             RADIATION ONCOLOGY END OF TREATMENT NOTE     Diagnosis: C50.412 Malignant neoplasm of upper-outer quadrant of left female breast Staging on 2022-05-21: Malignant neoplasm of upper-outer quadrant of left breast in female, estrogen receptor negative (HCC) T=cT2, N=cN1, M=cM0 Intent: Curative     ==========DELIVERED PLANS==========  First Treatment Date: 2023-01-27 Last Treatment Date: 2023-03-13   Plan Name: Breast_L_BH Site: Breast, Left Technique: 3D Mode: Photon Dose Per Fraction: 1.8 Gy Prescribed Dose (Delivered / Prescribed): 50.4 Gy / 50.4 Gy Prescribed Fxs (Delivered / Prescribed): 28 / 28   Plan Name: Brst_L_SCV_BH Site: Sclav-LT Technique: 3D Mode: Photon Dose Per Fraction: 1.8 Gy Prescribed Dose (Delivered / Prescribed): 50.4 Gy / 50.4 Gy Prescribed Fxs (Delivered / Prescribed): 28 / 28   Plan Name: Brst_L_BH_Bst Site: Breast, Left Technique: 3D Mode: Photon Dose Per Fraction: 2 Gy Prescribed Dose (Delivered / Prescribed): 10 Gy / 10 Gy Prescribed Fxs (Delivered / Prescribed): 5 / 5     ==========ON TREATMENT VISIT DATES========== 2023-01-27, 2023-02-03, 2023-02-10, 2023-02-17, 2023-02-24, 2023-03-03, 2023-03-10, 2023-03-12     ==========UPCOMING VISITS==========       ==========APPENDIX - ON TREATMENT VISIT NOTES==========   See weekly On Treatment Notes in Epic for details in the Media tab (listed as Progress notes on the On Treatment Visit Dates listed above).

## 2023-03-23 ENCOUNTER — Encounter: Payer: Self-pay | Admitting: *Deleted

## 2023-04-02 ENCOUNTER — Inpatient Hospital Stay: Payer: 59

## 2023-04-02 VITALS — BP 124/80 | HR 78 | Temp 98.2°F | Resp 17 | Wt 163.0 lb

## 2023-04-02 DIAGNOSIS — Z5112 Encounter for antineoplastic immunotherapy: Secondary | ICD-10-CM | POA: Diagnosis not present

## 2023-04-02 DIAGNOSIS — C50412 Malignant neoplasm of upper-outer quadrant of left female breast: Secondary | ICD-10-CM

## 2023-04-02 MED ORDER — HEPARIN SOD (PORK) LOCK FLUSH 100 UNIT/ML IV SOLN
500.0000 [IU] | Freq: Once | INTRAVENOUS | Status: AC | PRN
  Administered 2023-04-02: 500 [IU]

## 2023-04-02 MED ORDER — ACETAMINOPHEN 325 MG PO TABS
650.0000 mg | ORAL_TABLET | Freq: Once | ORAL | Status: AC
  Administered 2023-04-02: 650 mg via ORAL
  Filled 2023-04-02: qty 2

## 2023-04-02 MED ORDER — SODIUM CHLORIDE 0.9 % IV SOLN
420.0000 mg | Freq: Once | INTRAVENOUS | Status: AC
  Administered 2023-04-02: 420 mg via INTRAVENOUS
  Filled 2023-04-02: qty 14

## 2023-04-02 MED ORDER — TRASTUZUMAB-ANNS CHEMO 150 MG IV SOLR
6.0000 mg/kg | Freq: Once | INTRAVENOUS | Status: AC
  Administered 2023-04-02: 420 mg via INTRAVENOUS
  Filled 2023-04-02: qty 20

## 2023-04-02 MED ORDER — DIPHENHYDRAMINE HCL 25 MG PO CAPS
25.0000 mg | ORAL_CAPSULE | Freq: Once | ORAL | Status: AC
  Administered 2023-04-02: 25 mg via ORAL
  Filled 2023-04-02: qty 1

## 2023-04-02 MED ORDER — SODIUM CHLORIDE 0.9% FLUSH
10.0000 mL | INTRAVENOUS | Status: DC | PRN
  Administered 2023-04-02: 10 mL

## 2023-04-02 MED ORDER — SODIUM CHLORIDE 0.9 % IV SOLN: Freq: Once | INTRAVENOUS | Status: AC

## 2023-04-02 NOTE — Progress Notes (Signed)
Pt declined to stay for post 30 min obs, ambulatory to lobby, VSS upon discharge

## 2023-04-02 NOTE — Patient Instructions (Signed)
[facility]  Discharge Instructions: Thank you for choosing [facility] to provide your oncology and hematology care.   If you have a lab appointment with the Cancer Center, please go directly to the Cancer Center and check in at the registration area.   Wear comfortable clothing and clothing appropriate for easy access to any Portacath or PICC line.   We strive to give you quality time with your provider. You may need to reschedule your appointment if you arrive late (15 or more minutes).  Arriving late affects you and other patients whose appointments are after yours.  Also, if you miss three or more appointments without notifying the office, you may be dismissed from the clinic at the provider's discretion.      For prescription refill requests, have your pharmacy contact our office and allow 72 hours for refills to be completed.    Today you received the following chemotherapy and/or immunotherapy agents: Trastuzumab and Pertuzumab      To help prevent nausea and vomiting after your treatment, we encourage you to take your nausea medication as directed.  BELOW ARE SYMPTOMS THAT SHOULD BE REPORTED IMMEDIATELY: *FEVER GREATER THAN 100.4 F (38 C) OR HIGHER *CHILLS OR SWEATING *NAUSEA AND VOMITING THAT IS NOT CONTROLLED WITH YOUR NAUSEA MEDICATION *UNUSUAL SHORTNESS OF BREATH *UNUSUAL BRUISING OR BLEEDING *URINARY PROBLEMS (pain or burning when urinating, or frequent urination) *BOWEL PROBLEMS (unusual diarrhea, constipation, pain near the anus) TENDERNESS IN MOUTH AND THROAT WITH OR WITHOUT PRESENCE OF ULCERS (sore throat, sores in mouth, or a toothache) UNUSUAL RASH, SWELLING OR PAIN  UNUSUAL VAGINAL DISCHARGE OR ITCHING   Items with * indicate a potential emergency and should be followed up as soon as possible or go to the Emergency Department if any problems should occur.  Please show the CHEMOTHERAPY ALERT CARD  or IMMUNOTHERAPY ALERT CARD at check-in to the Emergency Department and triage nurse.  Should you have questions after your visit or need to cancel or reschedule your appointment, please contact [facility] - A DEPT OF [facility]  Dept: [phone]  and follow the prompts.  Office hours are 8:00 a.m. to 4:30 p.m. Monday - Friday. Please note that voicemails left after 4:00 p.m. may not be returned until the following business day.  We are closed weekends and major holidays. You have access to a nurse at all times for urgent questions. Please call the main number to the clinic Dept: [phone] and follow the prompts.   For any non-urgent questions, you may also contact your provider using MyChart. We now offer e-Visits for anyone 67 and older to request care online for non-urgent symptoms. For details visit [email or web address].   Also download the MyChart app! Go to the app store, search "MyChart", open the app, select [place], and log in with your MyChart username and password.

## 2023-04-06 ENCOUNTER — Ambulatory Visit: Payer: 59 | Admitting: Physical Therapy

## 2023-04-06 ENCOUNTER — Encounter: Payer: Self-pay | Admitting: Physical Therapy

## 2023-04-06 DIAGNOSIS — Z171 Estrogen receptor negative status [ER-]: Secondary | ICD-10-CM

## 2023-04-06 DIAGNOSIS — R293 Abnormal posture: Secondary | ICD-10-CM

## 2023-04-06 DIAGNOSIS — L599 Disorder of the skin and subcutaneous tissue related to radiation, unspecified: Secondary | ICD-10-CM

## 2023-04-06 DIAGNOSIS — I890 Lymphedema, not elsewhere classified: Secondary | ICD-10-CM | POA: Diagnosis not present

## 2023-04-06 DIAGNOSIS — R2681 Unsteadiness on feet: Secondary | ICD-10-CM

## 2023-04-06 DIAGNOSIS — C50412 Malignant neoplasm of upper-outer quadrant of left female breast: Secondary | ICD-10-CM

## 2023-04-06 NOTE — Therapy (Signed)
OUTPATIENT PHYSICAL THERAPY BREAST CANCER POST OP FOLLOW UP   Patient Name: Erika [name] MRN: [id number] DOB:1960-06-27, 62 y.o., female Today's Date: 04/06/2023  END OF SESSION:  PT End of Session - 04/06/23 1208     Visit Number 2    Number of Visits 9    Date for PT Re-Evaluation 05/05/23    PT Start Time 1207    PT Stop Time 1255    PT Time Calculation (min) 48 min    Activity Tolerance Patient tolerated treatment well    Behavior During Therapy [facility] for tasks assessed/performed             Past Medical History:  Diagnosis Date   Anxiety    Colitis 04/07/2008   seen on CT (at ER visit)   Depression    Family history of adverse reaction to anesthesia    mother with h/o nausea   GERD (gastroesophageal reflux disease)    Hyperplastic colon polyp    Hypertension    Liver hemangioma 04/07/2008   seen on CT   Past Surgical History:  Procedure Laterality Date   "freezing of cervix"     ABDOMINAL HYSTERECTOMY     BREAST BIOPSY Left 05/15/2022   [place] LT BREAST BX W LOC DEV 1ST LESION IMG BX SPEC US GUIDE 05/15/2022 [facility]   BREAST BIOPSY  11/03/2022   MM LT RADIOACTIVE SEED LOC MAMMO GUIDE 11/03/2022 [facility]   BREAST BIOPSY  11/03/2022   MM LT RADIOACTIVE SEED EA ADD LESION LOC MAMMO GUIDE 11/03/2022 [facility]   BREAST BIOPSY Left 11/03/2022   [place] LT RADIOACTIVE SEED LOC 11/03/2022 [facility]   BREAST LUMPECTOMY WITH RADIOACTIVE SEED AND SENTINEL LYMPH NODE BIOPSY Left 11/04/2022   Procedure: LEFT BREAST SEED BRACKETED LUMPECTOMY, LEFT SENTINEL LYMPH NODE BIOPSY;  Surgeon: [name], MD;  Location: [facility];  Service: General;  Laterality: Left;  PEC BLOCK   BREAST RECONSTRUCTION Left 11/14/2022   Procedure: LEFT  ONCOPLASTIC BREAST RECONSTRUCTION;  Surgeon: Glenna Fellows, MD;  Location: [facility];  Service: Plastics;  Laterality: Left;   BREAST REDUCTION SURGERY Right 11/14/2022   Procedure:  MAMMARY REDUCTION  (BREAST);  Surgeon: Glenna Fellows, MD;  Location: [facility];  Service: Plastics;  Laterality: Right;   CESAREAN SECTION     NASAL SINUS SURGERY     PORTACATH PLACEMENT N/A 05/29/2022   Procedure: INSERTION PORT-A-CATH;  Surgeon: [name], MD;  Location: [facility];  Service: General;  Laterality: N/A;   RADIOACTIVE SEED GUIDED AXILLARY SENTINEL LYMPH NODE Left 11/04/2022   Procedure: SEED TARGETED LEFT AXILLARY LYMPH NODE EXCISIONAL BIOPSY;  Surgeon: [name], MD;  Location: [facility];  Service: General;  Laterality: Left;   right kneecap surgery +     Patient Active Problem List   Diagnosis Date Noted   Port-A-Cath in place 06/03/2022   Genetic testing 05/30/2022   Therapeutic drug monitoring 05/27/2022   Malignant neoplasm of upper-outer quadrant of left breast in female, estrogen receptor negative (HCC) 05/19/2022    PCP: [name], PA-C  REFERRING PROVIDER: [name], MD  REFERRING DIAG: L breast cancer  THERAPY DIAG:  Lymphedema, not elsewhere classified  Disorder of the skin and subcutaneous tissue related to radiation, unspecified  Unsteadiness on feet  Abnormal posture  Malignant neoplasm of upper-outer quadrant of left breast in female, estrogen receptor negative (HCC)  Rationale for Evaluation and Treatment: Rehabilitation  ONSET DATE: 05/08/22  SUBJECTIVE:  SUBJECTIVE STATEMENT: The spot is not oozing any more. It is closed. My breast is sore when I press on it.   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 It measures 4.2 cm and is located in the upper outer quadrant. It is ER/PR negative and HER2 positive with a Ki67 of 35%. 11/04/22- L breast  lumpectomy and SLNB 0/4, 11/14/22- bilateral breast reduction. Had completed chemo, has 3 more radiation sessions as of 03/10/23 and will complete immunotherapy in Feb 2025  PATIENT GOALS:  Reassess how my recovery is going related to arm function, pain, and swelling.  PAIN:  Are you having pain? 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   OBJECTIVE:   PATIENT SURVEYS:  QUICK DASH:     OBSERVATIONS:   Media Information  Document Information  Photos  L wound healing  03/10/2023 15:38  Attached To:  Outpatient Rehab on 03/10/23 with Jeanella Craze, Remi Deter, PT  Source Information  [place], Remi Deter, PT  [facility]  Document History    Healing wound on L breast - mild lymphedema present in L inferior and lateral breast extending to axillary area  POSTURE:  Forward head and rounded shoulders posture   UPPER EXTREMITY AROM/PROM:   A/PROM RIGHT   eval   RIGHT 03/10/23  Shoulder extension 58 85  Shoulder flexion 157 165  Shoulder abduction 162 180  Shoulder internal rotation 72 81  Shoulder external rotation 90 95                          (Blank rows = not tested)   A/PROM LEFT   eval LEFT 03/10/23  Shoulder extension 66 86  Shoulder flexion 140 170  Shoulder abduction 163 175  Shoulder internal rotation 67 75  Shoulder external rotation 82 84                          (Blank rows = not tested)  SLS: 11 sec on L, 4 sec; tandem stance initially difficult to obtain but then pt able to hold with sway, standing with narrow base of support and eyes closed - able to but with increased sway in all directions  CERVICAL AROM: All within normal limits   UPPER EXTREMITY STRENGTH: WFL   LYMPHEDEMA  ASSESSMENTS:    LANDMARK RIGHT   eval  10 cm proximal to olecranon process 28.9  Olecranon process 24.8  10 cm proximal to ulnar styloid process 20.4  Just proximal to ulnar styloid process 15.4  Across hand at thumb web space 18.3  At base of 2nd digit 5.8  (Blank rows = not tested)   LANDMARK LEFT   eval LEFT 03/10/23  10 cm proximal to olecranon process 28.3 24.6  Olecranon process 25 24.1  10 cm proximal to ulnar styloid process 19.4 17.3  Just proximal to ulnar styloid process 15.2 14.7  Across hand at thumb web space 18.2 18  At base of 2nd digit 5.8 5.5  (Blank rows = not tested)  Surgery type/Date: 11/04/22- L breast lumpectomy and SLNB 0/4,  11/14/22- bilateral breast reduction Number of lymph nodes removed: 0/4  Current/past treatment (chemo, radiation, hormone therapy): completed chemo, will complete radiation on 03/13/23, will complete immunotherapy in Feb 2025  Other symptoms:  Heaviness/tightness No Pain No Pitting edema No Infections Yes numerous infections Decreased scar mobility No Stemmer sign No  TREATMENT PERFORMED: 04/06/23: In supine: Short neck, 5 diaphragmatic breaths, R axillary nodes and establishment of interaxillary pathway, L inguinal nodes and establishment of axilloinguinal pathway, then L breast moving fluid towards pathways spending extra time in any areas of fibrosis then retracing all steps. Instructed pt in anatomy and physiology of the lymphatic system throughout and had her return demonstrate steps. Issued handout for her to begin to practice at home.  Scar massage to entire breast scar on L to help decrease fibrosis.   PATIENT EDUCATION:  Education details: ABC class, lymphedema risk reduction, holding PT until skin has healed from radiation, how chemo affects proprioception  Person educated: Patient Education method: Explanation Education comprehension: verbalized understanding  HOME EXERCISE PROGRAM: Reviewed previously given post op  HEP.   ASSESSMENT:  CLINICAL IMPRESSION: Pt has now completely healed and has completed radiation. Began MLD to L breast and began to instruct pt. Also began scar mobilization and instructed pt in this as well. Increased fibrosis noted in L scar.   Pt will benefit from skilled therapeutic intervention to improve on the following deficits: Decreased knowledge of precautions, impaired UE functional use, pain, decreased ROM, postural dysfunction.   PT treatment/interventions: ADL/Self care home management, [id number]- PT Re-evaluation, 97110-Therapeutic exercises, 97530- Therapeutic activity, [id number]- Neuromuscular re-education, 97535- Self Care, 72536- Manual therapy, Patient/Family education, Manual lymph drainage, Scar mobilization, and Compression bandaging   GOALS: Goals reviewed with patient? Yes  LONG TERM GOALS:  (STG=LTG)  GOALS Name Target Date  Goal status  1 Pt will demonstrate she has regained full shoulder ROM and function post operatively compared to baselines.  Baseline: 03/10/23  MET  2 Pt will be independent in self MLD for long term management of lymphedema. 05/05/23 INITIAL  3 Pt will obtain a compression bra for long term management of lymphedema. 05/05/23 INITIAL  4 Pt will be able to stand on L and R feet for 30 sec without UE support to decrease fall risk. 05/05/23 INITIAL     PLAN:  PT FREQUENCY/DURATION: 2x/wk for 4 wks starting 04/06/23 to allow skin to heal from radiation  PLAN FOR NEXT SESSION: cont MLD to L breast and lateral trunk and assess independence,  does she have compression bra, is wound healed, high level balance activities especially with eyes closed    [facility]  [address], [address]  [place] [address]  [phone]  After Breast Cancer Class It is recommended you attend the ABC class to be educated on lymphedema risk reduction. This class is free of charge and lasts for 1 hour. It is a 1-time class. You will need to  download the TEAMS app either on your phone or computer. We will send you a link the night before or the morning of the class. You should be able to click on that link to join the class. This is not a confidential class. You don't have to turn your camera on, but other participants may be able to see your email address.  Scar massage You can begin gentle scar massage to you incision sites. Gently place one hand on the incision and move the skin (without sliding on the skin) in various  directions. Do this for a few minutes and then you can gently massage either coconut oil or vitamin E cream into the scars.  Compression garment You should continue wearing your compression bra until you feel like you no longer have swelling.  Home exercise Program Continue doing the exercises you were given until you feel like you can do them without feeling any tightness at the end.   Walking Program Studies show that 30 minutes of walking per day (fast enough to elevate your heart rate) can significantly reduce the risk of a cancer recurrence. If you can't walk due to other medical reasons, we encourage you to find another activity you could do (like a stationary bike or water exercise).  Posture After breast cancer surgery, people frequently sit with rounded shoulders posture because it puts their incisions on slack and feels better. If you sit like this and scar tissue forms in that position, you can become very tight and have pain sitting or standing with good posture. Try to be aware of your posture and sit and stand up tall to heal properly.  Follow up PT: It is recommended you return every 3 months for the first 3 years following surgery to be assessed on the SOZO machine for an L-Dex score. This helps prevent clinically significant lymphedema in 95% of patients. These follow up screens are 10 minute appointments that you are not billed for.  [facility], PT 04/06/2023, 1:06 PM

## 2023-04-06 NOTE — Patient Instructions (Signed)
Self manual lymph drainage: Perform this sequence once a day.  Only give enough pressure no your skin to make the skin move.  Diaphragmatic - Supine   Inhale through nose making navel move out toward hands. Exhale through puckered lips, hands follow navel in. Repeat _5__ times. Rest _10__ seconds between repeats.   Copyright  VHI. All rights reserved.  Hug yourself.  Do circles at your neck just above your collarbones.  Repeat this 10 times.  Axilla - One at a Time   Using full weight of flat hand and fingers at center of uninvolved armpit, make _10__ in-place circles.   Copyright  VHI. All rights reserved.  LEG: Inguinal Nodes Stimulation   With small finger side of hand against hip crease on involved side, gently perform circles at the crease. Repeat __10_ times.   Copyright  VHI. All rights reserved.  Axilla to Inguinal Nodes - Sweep   On involved side, stretch skin _4__ times from armpit along side of trunk to hip crease.  Now gently stretch skin from the involved side to the uninvolved side across the chest at the shoulder line.  Repeat that 4 times.  Draw an imaginary diagonal line from upper outer breast through the nipple area toward lower inner breast.  Direct fluid upward and inward from this line toward the pathway across your upper chest .  Do this in three rows to treat all of the upper inner breast tissue, and do each row 3-4x.      Direct fluid to treat all of lower outer breast tissue downward and outward toward      pathway that is aimed at the left groin.  Finish by doing the pathways as described above going from your involved armpit to the same side groin and going across your upper chest from the involved shoulder to the uninvolved shoulder.  Repeat the steps above where you do circles in your left groin and right armpit. Copyright  VHI. All rights reserved.   

## 2023-04-07 ENCOUNTER — Other Ambulatory Visit: Payer: Self-pay

## 2023-04-14 ENCOUNTER — Encounter: Payer: 59 | Admitting: Physical Therapy

## 2023-04-15 ENCOUNTER — Encounter: Payer: Self-pay | Admitting: Radiation Oncology

## 2023-04-15 NOTE — Progress Notes (Signed)
 Radiation Oncology         (336) [phone] ________________________________  Name: Erika [name] MRN: [id number]  Date: 04/16/2023  DOB: 13-Aug-1960  Follow-Up Visit Note  CC: Katina Pfeiffer, DEVONNA Odean Potts, MD    ICD-10-CM   1. Malignant neoplasm of upper-outer quadrant of left breast in female, estrogen receptor negative (HCC)  C50.412    Z17.1       Diagnosis:  Left breast with no residual carcinoma : s/p neoadjuvant chemotherapy, left lumpectomy w/ SLN evaluation, and bilateral breast reductions   Stage IIB (cT2, cN1, cM0) Left Breast UOQ, Invasive ductal carcinoma with left axillary nodal involvement, ER- / PR- / Her2+, Grade 3      Interval Since Last Radiation: 1 month and 3 days    Radiation treatment dates: First Treatment Date: 2023-01-27 - Last Treatment Date: 2023-03-13   Site/Dose/Technique/Mode:    Plan Name: Breast_L_BH Site: Breast, Left Technique: 3D Mode: Photon Dose Per Fraction: 1.8 Gy Prescribed Dose (Delivered / Prescribed): 50.4 Gy / 50.4 Gy Prescribed Fxs (Delivered / Prescribed): 28 / 28   Plan Name: Brst_L_SCV_BH Site: Sclav-LT Technique: 3D Mode: Photon Dose Per Fraction: 1.8 Gy Prescribed Dose (Delivered / Prescribed): 50.4 Gy / 50.4 Gy Prescribed Fxs (Delivered / Prescribed): 28 / 28   Plan Name: Brst_L_BH_Bst Site: Breast, Left - Boost  Technique: 3D Mode: Photon Dose Per Fraction: 2 Gy Prescribed Dose (Delivered / Prescribed): 10 Gy / 10 Gy Prescribed Fxs (Delivered / Prescribed): 5 / 5   Narrative:  The patient returns today for routine follow-up. She tolerated radiation treatment relatively well. During her final weekly treatment check on 03/12/23, the patient endorsed some fatigue and lymphedema. She also reported having some ongoing skin irritation which is improving. Physical exam performed that same date showed less erythema to the left chest area and some desquamation. No moist desquamation was appreciated at that time.      During her most recent visit with Dr. Gudena on 03/12/23 (the same day as her final radiation therapy weekly treatment check), she did endorse having severe radiation dermatitis with oozing, and oral pain from radiation therapy. She was noted to manage these symptoms (respectively) with soaking and ointments, and lidocaine  gel which both seemed to provide her relief.    Since completing radiation therapy (and in the interval since her initial consultation date) she has continued to receive maintenance therapy with Heceptin-Perjeta  under Dr. Odean. She continues to tolerate this regimen relatively well.   Of note: Per Dr. Gara encounter notes, she has expressed concern that she may have potential microscopic cancer cells. To assess for this, Dr. Gudena has ordered Gardant blood testing.   Patient reports to be doing well overall today. She notes some numbness to the lateral breast and left axilla. She feels as though her skin has healed since completing the radiation treatment. She denies any residual oral pain, breast pain, or issues with range of motion.    Allergies:  is allergic to gabapentin  (once-daily) and penicillins.  Meds: Current Outpatient Medications  Medication Sig Dispense Refill   ALPRAZolam (XANAX) 0.5 MG tablet 1 TABLET ORALLY TWICE DAILY AS NEEDED 30 DAYS     aspirin EC 81 MG tablet Take 81 mg by mouth daily.     diphenoxylate -atropine  (LOMOTIL ) 2.5-0.025 MG tablet TAKE 1 TABLET BY MOUTH FOUR TIMES A DAY AS NEEDED FOR DIARRHEA/LOOSE STOOLS 30 tablet 1   lisinopril (ZESTRIL) 20 MG tablet Take 20 mg by mouth daily.  loratadine (CLARITIN) 10 MG tablet Take 10 mg by mouth daily as needed for allergies.     omeprazole (PRILOSEC) 20 MG capsule Take by mouth.     rosuvastatin (CRESTOR) 20 MG tablet Take 20 mg by mouth daily.     sertraline (ZOLOFT) 100 MG tablet Take 100 mg by mouth daily.     amoxicillin -clavulanate (AUGMENTIN ) 875-125 MG tablet Take 1 tablet by mouth  2 (two) times daily. (Patient not taking: Reported on 04/16/2023) 14 tablet 0   cholestyramine  (QUESTRAN ) 4 g packet Take 1 packet (4 g total) by mouth 3 (three) times daily with meals. (Patient not taking: Reported on 04/16/2023) 60 each 12   LORazepam  (ATIVAN ) 0.5 MG tablet Take 1 tablet (0.5 mg total) by mouth daily as needed for anxiety. (Patient not taking: Reported on 04/16/2023) 30 tablet 0   triamcinolone  ointment (KENALOG ) 0.5 % Apply 1 Application topically 2 (two) times daily. (Patient not taking: Reported on 04/16/2023) 30 g 2   valACYclovir  (VALTREX ) 1000 MG tablet Take 1 tablet (1,000 mg total) by mouth 2 (two) times daily. (Patient not taking: Reported on 04/16/2023) 20 tablet 0   No current facility-administered medications for this encounter.   Facility-Administered Medications Ordered in Other Encounters  Medication Dose Route Frequency Provider Last Rate Last Admin   heparin  lock flush 100 unit/mL  250 Units Intracatheter PRN Odean Potts, MD       sodium chloride  flush (NS) 0.9 % injection 3 mL  3 mL Intracatheter PRN Odean Potts, MD        Physical Findings: The patient is in no acute distress. Patient is alert and oriented.  height is 5' 8 (1.727 m) and weight is 166 lb 12.8 oz (75.7 kg). Her oral temperature is 97.9 F (36.6 C). Her blood pressure is 125/75 and her pulse is 81. Her respiration is 18 and oxygen saturation is 100%. .  No significant changes. Lungs are clear to auscultation bilaterally. Heart has regular rate and rhythm. No palpable cervical, supraclavicular, or axillary adenopathy. Abdomen soft, non-tender, normal bowel sounds.  Right Breast: no palpable mass, nipple discharge or bleeding. Well healed reduction scars. Left Breast: skin shows satisfacotry healing within the treatment fields with mild erythema to the inframammary fold. Well healed surgical incision and breast reductions scars with no palpable masses.   Lab Findings: Lab Results  Component Value  Date   WBC 6.2 01/06/2023   HGB 10.4 (L) 01/06/2023   HCT 31.5 (L) 01/06/2023   MCV 100.3 (H) 01/06/2023   PLT 235 01/06/2023    Radiographic Findings: No results found.  Impression/Plan:  Stage IIB (cT2, cN1, cM0) Left Breast UOQ, Invasive ductal carcinoma with left axillary nodal involvement, ER- / PR- / Her2+, Grade 3      The patient is doing well overall today and has healed from the effects of radiation. Patient will continue on Heceptin-Perjeta   under the care of Gudena. She is scheduled to see Morna Kendall for survivorship clinic on 04/23/2023. Radiation follow-up PRN. She has been advised to call back with any questions or concerns. We appreciate the opportunity to take part in this patient's care.    20 minutes of total time was spent for this patient encounter, including preparation, face-to-face counseling with the patient and coordination of care, physical exam, and documentation of the encounter. ____________________________________    Leeroy Due, PA-C   This document serves as a record of services personally performed by Leeroy Due, PA-C. It was created on his  behalf by Dorthy Fuse, a trained medical scribe. The creation of this record is based on the scribe's personal observations and the provider's statements to them. This document has been checked and approved by the attending provider.

## 2023-04-15 NOTE — Progress Notes (Signed)
  Radiation Oncology         [phone]) [phone] ________________________________  Name: Erika [name] MRN: [id number]  Date: 04/16/2023  DOB: 07-Jul-1960  End of Treatment Note  Diagnosis:  Left breast with no residual carcinoma : s/p neoadjuvant chemotherapy, left lumpectomy w/ SLN evaluation, and bilateral breast reductions   Stage IIB (cT2, cN1, cM0) Left Breast UOQ, Invasive ductal carcinoma with left axillary nodal involvement, ER- / PR- / Her2+, Grade 3      Indication for treatment: Curative        Radiation treatment dates: First Treatment Date: 2023-01-27 - Last Treatment Date: 2023-03-13  Site/Dose/Technique/Mode:   Plan Name: Breast_L_BH Site: Breast, Left Technique: 3D Mode: Photon Dose Per Fraction: 1.8 Gy Prescribed Dose (Delivered / Prescribed): 50.4 Gy / 50.4 Gy Prescribed Fxs (Delivered / Prescribed): 28 / 28   Plan Name: Brst_L_SCV_BH Site: Sclav-LT Technique: 3D Mode: Photon Dose Per Fraction: 1.8 Gy Prescribed Dose (Delivered / Prescribed): 50.4 Gy / 50.4 Gy Prescribed Fxs (Delivered / Prescribed): 28 / 28   Plan Name: Brst_L_BH_Bst Site: Breast, Left - Boost  Technique: 3D Mode: Photon Dose Per Fraction: 2 Gy Prescribed Dose (Delivered / Prescribed): 10 Gy / 10 Gy Prescribed Fxs (Delivered / Prescribed): 5 / 5  Narrative: The patient tolerated radiation treatment relatively well. During her final weekly treatment check on 03/12/23, the patient endorsed some fatigue and lymphedema. She also reported having some ongoing skin irritation which is improving. Physical exam performed that same date showed less erythema to the left chest area and some desquamation. No moist desquamation was appreciated at that time.    Plan: The patient has completed radiation treatment. The patient will return to radiation oncology clinic for routine followup in one month. I advised them to call or return sooner if they have any questions or concerns related to their recovery or  treatment.  -----------------------------------  Lynwood CHARM Nasuti, PhD, MD  This document serves as a record of services personally performed by Lynwood Nasuti, MD. It was created on his behalf by Dorthy Fuse, a trained medical scribe. The creation of this record is based on the scribe's personal observations and the provider's statements to them. This document has been checked and approved by the attending provider.

## 2023-04-16 ENCOUNTER — Encounter: Payer: Self-pay | Admitting: Radiation Oncology

## 2023-04-16 ENCOUNTER — Encounter: Payer: 59 | Admitting: Physical Therapy

## 2023-04-16 ENCOUNTER — Ambulatory Visit
Admission: RE | Admit: 2023-04-16 | Discharge: 2023-04-16 | Disposition: A | Discharge status: Home / Self Care | Payer: 59 | Source: Ambulatory Visit | Attending: Radiation Oncology | Admitting: Radiation Oncology

## 2023-04-16 VITALS — BP 125/75 | HR 81 | Temp 97.9°F | Resp 18 | Ht 68.0 in | Wt 166.8 lb

## 2023-04-16 DIAGNOSIS — Z923 Personal history of irradiation: Secondary | ICD-10-CM | POA: Diagnosis not present

## 2023-04-16 DIAGNOSIS — C50412 Malignant neoplasm of upper-outer quadrant of left female breast: Secondary | ICD-10-CM | POA: Diagnosis present

## 2023-04-16 DIAGNOSIS — Z171 Estrogen receptor negative status [ER-]: Secondary | ICD-10-CM | POA: Diagnosis not present

## 2023-04-16 HISTORY — DX: Personal history of irradiation: Z92.3

## 2023-04-16 NOTE — Progress Notes (Signed)
 Erika [name] is here today for follow up post radiation to the breast.   Breast Side: Left   They completed their radiation on: 03/13/2023  Does the patient complain of any of the following: Post radiation skin issues: Denies Breast Tenderness: Yes Breast Swelling: Denies Lymphadema: Denies Range of Motion limitations: Denies Fatigue post radiation: Denies Appetite good/fair/poor: Good   BP 125/75 (BP Location: Right Arm, Patient Position: Sitting)   Pulse 81   Temp 97.9 F (36.6 C) (Oral)   Resp 18   Ht 5' 8 (1.727 m)   Wt 166 lb 12.8 oz (75.7 kg)   SpO2 100%   BMI 25.36 kg/m

## 2023-04-21 ENCOUNTER — Encounter: Payer: Self-pay | Admitting: Physical Therapy

## 2023-04-21 ENCOUNTER — Ambulatory Visit: Payer: 59 | Attending: General Surgery | Admitting: Physical Therapy

## 2023-04-21 DIAGNOSIS — Z171 Estrogen receptor negative status [ER-]: Secondary | ICD-10-CM | POA: Insufficient documentation

## 2023-04-21 DIAGNOSIS — C50412 Malignant neoplasm of upper-outer quadrant of left female breast: Secondary | ICD-10-CM | POA: Diagnosis present

## 2023-04-21 DIAGNOSIS — R293 Abnormal posture: Secondary | ICD-10-CM

## 2023-04-21 DIAGNOSIS — L599 Disorder of the skin and subcutaneous tissue related to radiation, unspecified: Secondary | ICD-10-CM | POA: Diagnosis present

## 2023-04-21 DIAGNOSIS — I890 Lymphedema, not elsewhere classified: Secondary | ICD-10-CM | POA: Diagnosis present

## 2023-04-21 DIAGNOSIS — R2681 Unsteadiness on feet: Secondary | ICD-10-CM | POA: Diagnosis present

## 2023-04-21 NOTE — Therapy (Signed)
 OUTPATIENT PHYSICAL THERAPY BREAST CANCER POST OP FOLLOW UP   Patient Name: Erika [name] MRN: [id number] DOB:October 13, 1960, 63 y.o., female Today's Date: 04/21/2023  END OF SESSION:  PT End of Session - 04/21/23 1110     Visit Number 3    Number of Visits 9    Date for PT Re-Evaluation 05/05/23    PT Start Time 1110    PT Stop Time 1155    PT Time Calculation (min) 45 min    Activity Tolerance Patient tolerated treatment well    Behavior During Therapy [facility] for tasks assessed/performed             Past Medical History:  Diagnosis Date   Anxiety    Colitis 04/07/2008   seen on CT (at ER visit)   Depression    Family history of adverse reaction to anesthesia    mother with h/o nausea   GERD (gastroesophageal reflux disease)    History of radiation therapy    Left breast-01/27/23-03/13/23- Dr. Lynwood Nasuti   Hyperplastic colon polyp    Hypertension    Liver hemangioma 04/07/2008   seen on CT   Past Surgical History:  Procedure Laterality Date   freezing of cervix     ABDOMINAL HYSTERECTOMY     BREAST BIOPSY Left 05/15/2022   US  LT BREAST BX W LOC DEV 1ST LESION IMG BX SPEC US  GUIDE 05/15/2022 [facility]   BREAST BIOPSY  11/03/2022   MM LT RADIOACTIVE SEED LOC MAMMO GUIDE 11/03/2022 [facility]   BREAST BIOPSY  11/03/2022   MM LT RADIOACTIVE SEED EA ADD LESION LOC MAMMO GUIDE 11/03/2022 [facility]   BREAST BIOPSY Left 11/03/2022   US  LT RADIOACTIVE SEED LOC 11/03/2022 [facility]   BREAST LUMPECTOMY WITH RADIOACTIVE SEED AND SENTINEL LYMPH NODE BIOPSY Left 11/04/2022   Procedure: LEFT BREAST SEED BRACKETED LUMPECTOMY, LEFT SENTINEL LYMPH NODE BIOPSY;  Surgeon: Aron Shoulders, MD;  Location: [facility];  Service: General;  Laterality: Left;  PEC BLOCK   BREAST RECONSTRUCTION Left 11/14/2022   Procedure: LEFT  ONCOPLASTIC BREAST RECONSTRUCTION;  Surgeon: Arelia Filippo, MD;  Location: [facility];  Service:  Plastics;  Laterality: Left;   BREAST REDUCTION SURGERY Right 11/14/2022   Procedure: MAMMARY REDUCTION  (BREAST);  Surgeon: Arelia Filippo, MD;  Location: [facility];  Service: Plastics;  Laterality: Right;   CESAREAN SECTION     NASAL SINUS SURGERY     PORTACATH PLACEMENT N/A 05/29/2022   Procedure: INSERTION PORT-A-CATH;  Surgeon: Aron Shoulders, MD;  Location: [facility];  Service: General;  Laterality: N/A;   RADIOACTIVE SEED GUIDED AXILLARY SENTINEL LYMPH NODE Left 11/04/2022   Procedure: SEED TARGETED LEFT AXILLARY LYMPH NODE EXCISIONAL BIOPSY;  Surgeon: Aron Shoulders, MD;  Location: [facility];  Service: General;  Laterality: Left;   right kneecap surgery +     Patient Active Problem List   Diagnosis Date Noted   Port-A-Cath in place 06/03/2022   Genetic testing 05/30/2022   Therapeutic drug monitoring 05/27/2022   Malignant neoplasm of upper-outer quadrant of left breast in female, estrogen receptor negative (HCC) 05/19/2022    PCP: Charmaine Bright, PA-C  REFERRING PROVIDER: Shoulders Aron, MD  REFERRING DIAG: L breast cancer  THERAPY DIAG:  Lymphedema, not elsewhere classified  Disorder of the skin and subcutaneous tissue related to radiation, unspecified  Unsteadiness on feet  Abnormal posture  Malignant neoplasm of upper-outer quadrant of left breast in female, estrogen receptor negative (HCC)  Rationale for Evaluation and Treatment: Rehabilitation  ONSET DATE: 05/08/22  SUBJECTIVE:                                                                                                                                                                                           SUBJECTIVE STATEMENT: I don't feel like I have had much swelling. I forgot to get the foam from you.    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 It measures 4.2 cm and is located in the upper outer  quadrant. It is ER/PR negative and HER2 positive with a Ki67 of 35%. 11/04/22- L breast lumpectomy and SLNB 0/4, 11/14/22- bilateral breast reduction. Had completed chemo, has 3 more radiation sessions as of 03/10/23 and will complete immunotherapy in Feb 2025  PATIENT GOALS:  Reassess how my recovery is going related to arm function, pain, and swelling.  PAIN:  Are you having pain? 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   OBJECTIVE:   PATIENT SURVEYS:  QUICK DASH:     OBSERVATIONS:   Media Information  Document Information  Photos  L wound healing  03/10/2023 15:38  Attached To:  Outpatient Rehab on 03/10/23 with Lanis Carbon, Florina CROME, PT  Source Information  [place], Florina CROME, PT  [facility]  Document History    Healing wound on L breast - mild lymphedema present in L inferior and lateral breast extending to axillary area  POSTURE:  Forward head and rounded shoulders posture   UPPER EXTREMITY AROM/PROM:   A/PROM RIGHT   eval   RIGHT 03/10/23  Shoulder extension 58 85  Shoulder flexion 157 165  Shoulder abduction 162 180  Shoulder internal rotation 72 81  Shoulder external rotation 90 95                          (Blank rows = not tested)   A/PROM LEFT   eval LEFT 03/10/23  Shoulder extension 66 86  Shoulder flexion 140 170  Shoulder abduction 163 175  Shoulder internal rotation 67 75  Shoulder external rotation 82 84                          (Blank rows = not tested)  SLS: 11 sec on L, 4  sec; tandem stance initially difficult to obtain but then pt able to hold with sway, standing with narrow base of support and eyes closed - able to but with increased sway in all directions 04/21/23- 11 on R, 11 sec on L   CERVICAL AROM: All within  normal limits   UPPER EXTREMITY STRENGTH: WFL   LYMPHEDEMA ASSESSMENTS:    LANDMARK RIGHT   eval  10 cm proximal to olecranon process 28.9  Olecranon process 24.8  10 cm proximal to ulnar styloid process 20.4  Just proximal to ulnar styloid process 15.4  Across hand at thumb web space 18.3  At base of 2nd digit 5.8  (Blank rows = not tested)   LANDMARK LEFT   eval LEFT 03/10/23  10 cm proximal to olecranon process 28.3 24.6  Olecranon process 25 24.1  10 cm proximal to ulnar styloid process 19.4 17.3  Just proximal to ulnar styloid process 15.2 14.7  Across hand at thumb web space 18.2 18  At base of 2nd digit 5.8 5.5  (Blank rows = not tested)  Surgery type/Date: 11/04/22- L breast lumpectomy and SLNB 0/4, 11/14/22- bilateral breast reduction Number of lymph nodes removed: 0/4  Current/past treatment (chemo, radiation, hormone therapy): completed chemo, will complete radiation on 03/13/23, will complete immunotherapy in Feb 2025  Other symptoms:  Heaviness/tightness No Pain No Pitting edema No Infections Yes numerous infections Decreased scar mobility No Stemmer sign No  TREATMENT PERFORMED: 04/21/23: Had pt demonstrate self MLD today to L breast. Provided v/c and t/c for correct skin stretch technique and sequence. She had forgotten to made circles at her groin and axilla and initially was not using her inter axillary pathway. Educated pt in these steps and provided cues for pressure and direction of stretch. No edema visible today. Issued 1/2 grey foam in thick stockinette for pt to wear in her bra to keep her bra from digging in to her skin. Assessed balance by repeating SLS again - she is still unable to maintain longer than 11 sec bilaterally Discussed role of neuropathy and how this affects balance and appropriate exercises to improve this In // bars: heel/toe on air ex beam x 3 with pt returning therapist demo and requiring occasional HHA on // bars, standing on air ex  pad - 3 ways raises x 5 reps each, practiced standing in tandem though this was not challenging like dynamic movements were Discussed using a pillow at home in place of foam Measured pt for compression sleeve - Sigvaris secure S2 and sent pt link through [employer] for self pay for prophylactic sleeve  04/06/23: In supine: Short neck, 5 diaphragmatic breaths, R axillary nodes and establishment of interaxillary pathway, L inguinal nodes and establishment of axilloinguinal pathway, then L breast moving fluid towards pathways spending extra time in any areas of fibrosis then retracing all steps. Instructed pt in anatomy and physiology of the lymphatic system throughout and had her return demonstrate steps. Issued handout for her to begin to practice at home.  Scar massage to entire breast scar on L to help decrease fibrosis.   PATIENT EDUCATION:  Education details: ABC class, lymphedema risk reduction, holding PT until skin has healed from radiation, how chemo affects proprioception  Person educated: Patient Education method: Explanation Education comprehension: verbalized understanding  HOME EXERCISE PROGRAM: Reviewed previously given post op HEP.   ASSESSMENT:  CLINICAL IMPRESSION: Pt missed her appointments last week because she was sick. She is feeling like her breast is not swelling anymore  and therapist was not able to visualize any swelling today. Cut 1/2 inch grey foam for pt to wear in her bra to make her compression bra more comfortable. Continued to review self MLD and provided v/c and t/c for correct technique and skin stretch in case pt feels her edema returns. Began focusing on high level balance since pt feels this has been an issue since completing chemotherapy. Instructed pt in several exercises to begin doing at home to work on improving dynamic balance with narrow BOS. Pt will practice over the next week and a half and therapist will re test SLS and determine if pt needs additional  skilled PT services at that time.   Pt will benefit from skilled therapeutic intervention to improve on the following deficits: Decreased knowledge of precautions, impaired UE functional use, pain, decreased ROM, postural dysfunction.   PT treatment/interventions: ADL/Self care home management, [phone]- PT Re-evaluation, 97110-Therapeutic exercises, 97530- Therapeutic activity, [id number]- Neuromuscular re-education, 97535- Self Care, [id number]- Manual therapy, Patient/Family education, Manual lymph drainage, Scar mobilization, and Compression bandaging   GOALS: Goals reviewed with patient? Yes  LONG TERM GOALS:  (STG=LTG)  GOALS Name Target Date  Goal status  1 Pt will demonstrate she has regained full shoulder ROM and function post operatively compared to baselines.  Baseline: 03/10/23  MET  2 Pt will be independent in self MLD for long term management of lymphedema. 05/05/23 INITIAL  3 Pt will obtain a compression bra for long term management of lymphedema. 05/05/23 MET 04/21/23  4 Pt will be able to stand on L and R feet for 30 sec without UE support to decrease fall risk. 05/05/23 INITIAL     PLAN:  PT FREQUENCY/DURATION: 2x/wk for 4 wks starting 04/06/23 to allow skin to heal from radiation  PLAN FOR NEXT SESSION: any questions about self MLD?,  how have balance exercises been, re test SLS and determine to continue or d/c    [facility]  [address]  [place] [address]  [phone]  After Breast Cancer Class It is recommended you attend the ABC class to be educated on lymphedema risk reduction. This class is free of charge and lasts for 1 hour. It is a 1-time class. You will need to download the TEAMS app either on your phone or computer. We will send you a link the night before or the morning of the class. You should be able to click on that link to join the class. This is not a confidential class. You don't have to turn your camera on, but other  participants may be able to see your email address.  Scar massage You can begin gentle scar massage to you incision sites. Gently place one hand on the incision and move the skin (without sliding on the skin) in various directions. Do this for a few minutes and then you can gently massage either coconut oil or vitamin E cream into the scars.  Compression garment You should continue wearing your compression bra until you feel like you no longer have swelling.  Home exercise Program Continue doing the exercises you were given until you feel like you can do them without feeling any tightness at the end.   Walking Program Studies show that 30 minutes of walking per day (fast enough to elevate your heart rate) can significantly reduce the risk of a cancer recurrence. If you can't walk due to other medical reasons, we encourage you to find another activity you could do (  like a stationary bike or water exercise).  Posture After breast cancer surgery, people frequently sit with rounded shoulders posture because it puts their incisions on slack and feels better. If you sit like this and scar tissue forms in that position, you can become very tight and have pain sitting or standing with good posture. Try to be aware of your posture and sit and stand up tall to heal properly.  Follow up PT: It is recommended you return every 3 months for the first 3 years following surgery to be assessed on the SOZO machine for an L-Dex score. This helps prevent clinically significant lymphedema in 95% of patients. These follow up screens are 10 minute appointments that you are not billed for.  [facility], PT 04/21/2023, 1:03 PM

## 2023-04-23 ENCOUNTER — Encounter: Payer: Self-pay | Admitting: Hematology and Oncology

## 2023-04-23 ENCOUNTER — Inpatient Hospital Stay: Payer: 59

## 2023-04-23 ENCOUNTER — Inpatient Hospital Stay: Payer: 59 | Attending: Hematology and Oncology | Admitting: Adult Health

## 2023-04-23 VITALS — BP 125/67 | HR 90 | Temp 98.6°F | Resp 18 | Ht 68.0 in | Wt 165.7 lb

## 2023-04-23 DIAGNOSIS — Z923 Personal history of irradiation: Secondary | ICD-10-CM | POA: Insufficient documentation

## 2023-04-23 DIAGNOSIS — C50412 Malignant neoplasm of upper-outer quadrant of left female breast: Secondary | ICD-10-CM

## 2023-04-23 DIAGNOSIS — Z5112 Encounter for antineoplastic immunotherapy: Secondary | ICD-10-CM | POA: Insufficient documentation

## 2023-04-23 DIAGNOSIS — L03031 Cellulitis of right toe: Secondary | ICD-10-CM

## 2023-04-23 DIAGNOSIS — Z09 Encounter for follow-up examination after completed treatment for conditions other than malignant neoplasm: Secondary | ICD-10-CM | POA: Diagnosis not present

## 2023-04-23 DIAGNOSIS — Z171 Estrogen receptor negative status [ER-]: Secondary | ICD-10-CM | POA: Diagnosis not present

## 2023-04-23 DIAGNOSIS — Z95828 Presence of other vascular implants and grafts: Secondary | ICD-10-CM

## 2023-04-23 LAB — CMP (CANCER CENTER ONLY)
ALT: 28 U/L (ref 0–44)
AST: 23 U/L (ref 15–41)
Albumin: 4.3 g/dL (ref 3.5–5.0)
Alkaline Phosphatase: 88 U/L (ref 38–126)
Anion gap: 7 (ref 5–15)
BUN: 22 mg/dL (ref 8–23)
CO2: 28 mmol/L (ref 22–32)
Calcium: 9.5 mg/dL (ref 8.9–10.3)
Chloride: 107 mmol/L (ref 98–111)
Creatinine: 0.95 mg/dL (ref 0.44–1.00)
GFR, Estimated: >60 mL/min (ref >60)
Glucose, Bld: 123 mg/dL — ABNORMAL HIGH (ref 70–99)
Potassium: 3.8 mmol/L (ref 3.5–5.1)
Sodium: 142 mmol/L (ref 135–145)
Total Bilirubin: 0.4 mg/dL (ref 0.0–1.2)
Total Protein: 6.9 g/dL (ref 6.5–8.1)

## 2023-04-23 LAB — CBC WITH DIFFERENTIAL (CANCER CENTER ONLY)
Abs Immature Granulocytes: 0.01 10*3/uL (ref 0.00–0.07)
Basophils Absolute: 0 10*3/uL (ref 0.0–0.1)
Basophils Relative: 0 %
Eosinophils Absolute: 0.2 10*3/uL (ref 0.0–0.5)
Eosinophils Relative: 3 %
HCT: 33 % — ABNORMAL LOW (ref 36.0–46.0)
Hemoglobin: 11.2 g/dL — ABNORMAL LOW (ref 12.0–15.0)
Immature Granulocytes: 0 %
Lymphocytes Relative: 20 %
Lymphs Abs: 1.1 10*3/uL (ref 0.7–4.0)
MCH: 33.6 pg (ref 26.0–34.0)
MCHC: 33.9 g/dL (ref 30.0–36.0)
MCV: 99.1 fL (ref 80.0–100.0)
Monocytes Absolute: 0.5 10*3/uL (ref 0.1–1.0)
Monocytes Relative: 9 %
Neutro Abs: 3.8 10*3/uL (ref 1.7–7.7)
Neutrophils Relative %: 68 %
Platelet Count: 235 10*3/uL (ref 150–400)
RBC: 3.33 MIL/uL — ABNORMAL LOW (ref 3.87–5.11)
RDW: 13.8 % (ref 11.5–15.5)
WBC Count: 5.6 10*3/uL (ref 4.0–10.5)
nRBC: 0 % (ref 0.0–0.2)

## 2023-04-23 MED ORDER — SODIUM CHLORIDE 0.9 % IV SOLN: Freq: Once | INTRAVENOUS | Status: AC

## 2023-04-23 MED ORDER — TRASTUZUMAB-ANNS CHEMO 150 MG IV SOLR
6.0000 mg/kg | Freq: Once | INTRAVENOUS | Status: AC
  Administered 2023-04-23: 420 mg via INTRAVENOUS
  Filled 2023-04-23: qty 20

## 2023-04-23 MED ORDER — SODIUM CHLORIDE 0.9% FLUSH
10.0000 mL | INTRAVENOUS | Status: DC | PRN
Start: 2023-04-23 — End: 2023-04-23
  Administered 2023-04-23: 10 mL

## 2023-04-23 MED ORDER — SODIUM CHLORIDE 0.9 % IV SOLN
420.0000 mg | Freq: Once | INTRAVENOUS | Status: AC
  Administered 2023-04-23: 420 mg via INTRAVENOUS
  Filled 2023-04-23: qty 14

## 2023-04-23 MED ORDER — SODIUM CHLORIDE 0.9% FLUSH
10.0000 mL | Freq: Once | INTRAVENOUS | Status: AC
  Administered 2023-04-23: 10 mL

## 2023-04-23 MED ORDER — ACETAMINOPHEN 325 MG PO TABS
650.0000 mg | ORAL_TABLET | Freq: Once | ORAL | Status: AC
Start: 2023-04-23 — End: 2023-04-23
  Administered 2023-04-23: 650 mg via ORAL
  Filled 2023-04-23: qty 2

## 2023-04-23 MED ORDER — HEPARIN SOD (PORK) LOCK FLUSH 100 UNIT/ML IV SOLN
500.0000 [IU] | Freq: Once | INTRAVENOUS | Status: AC | PRN
Start: 2023-04-23 — End: 2023-04-23
  Administered 2023-04-23: 500 [IU]

## 2023-04-23 MED ORDER — DIPHENHYDRAMINE HCL 25 MG PO CAPS
25.0000 mg | ORAL_CAPSULE | Freq: Once | ORAL | Status: AC
  Administered 2023-04-23: 25 mg via ORAL
  Filled 2023-04-23: qty 1

## 2023-04-23 NOTE — Progress Notes (Signed)
[facility] Cancer Follow up:    Erika Soho, PA-C [address] [place] [address]   DIAGNOSIS:  Cancer Staging  Malignant neoplasm of upper-outer quadrant of left breast in female, estrogen receptor negative (HCC) Staging form: Breast, AJCC 8th Edition - Clinical stage from 05/21/2022: Stage IIB (cT2, cN1, cM0, G3, ER-, PR-, HER2+) - Signed by [name], MD on 05/21/2022 Stage prefix: Initial diagnosis Histologic grading system: 3 grade system   SUMMARY OF ONCOLOGIC HISTORY: Oncology History  Malignant neoplasm of upper-outer quadrant of left breast in female, estrogen receptor negative (HCC)  05/15/2022 Initial Diagnosis   Palpable mass in the left breast at 1 o'clock position measured 4.2 cm ultrasound-guided biopsy revealed grade 3 IDC ER/PR negative HER2 positive Ki-67 35%, left axillary lymph node: Biopsy positive   05/21/2022 Cancer Staging   Staging form: Breast, AJCC 8th Edition - Clinical stage from 05/21/2022: Stage IIB (cT2, cN1, cM0, G3, ER-, PR-, HER2+) - Signed by [name], MD on 05/21/2022 Stage prefix: Initial diagnosis Histologic grading system: 3 grade system   05/29/2022 Genetic Testing   Negative [facility] Multi-Cancer +RNA Panel.  Report date is 05/29/2022.   The Multi-Cancer + RNA Panel offered by [facility] includes sequencing and/or deletion/duplication analysis of the following 70 genes:  AIP*, ALK, APC*, ATM*, AXIN2*, BAP1*, BARD1*, BLM*, BMPR1A*, BRCA1*, BRCA2*, BRIP1*, CDC73*, CDH1*, CDK4, CDKN1B*, CDKN2A, CHEK2*, CTNNA1*, DICER1*, EPCAM (del/dup only), EGFR, FH*, FLCN*, GREM1 (promoter dup only), HOXB13, KIT, LZTR1, MAX*, MBD4, MEN1*, MET, MITF, MLH1*, MSH2*, MSH3*, MSH6*, MUTYH*, NF1*, NF2*, NTHL1*, PALB2*, PDGFRA, PMS2*, POLD1*, POLE*, POT1*, PRKAR1A*, PTCH1*, PTEN*, RAD51C*, RAD51D*, RB1*, RET, SDHA* (sequencing only), SDHAF2*, SDHB*, SDHC*, SDHD*, SMAD4*, SMARCA4*, SMARCB1*, SMARCE1*, STK11*, SUFU*, TMEM127*, TP53*, TSC1*,  TSC2*, VHL*. RNA analysis is performed for * genes.   06/05/2022 - 10/29/2022 Chemotherapy   Patient is on Treatment Plan : BREAST  Docetaxel + Carboplatin + Trastuzumab + Pertuzumab  (TCHP) q21d      11/20/2022 -  Chemotherapy   Patient is on Treatment Plan : BREAST Trastuzumab  + Pertuzumab q21d x 13 cycles     01/28/2023 - 03/13/2023 Radiation Therapy   Adjuvant radiation     CURRENT THERAPY: Herceptin/Perjeta  INTERVAL HISTORY:  Discussed the use of AI scribe software for clinical note transcription with the patient, who gave verbal consent to proceed.  Erika [name] 63 y.o. female  currently undergoing cancer treatment with Herceptin/Perjeta, is due to finish treatment on February 27th. She has completed radiation therapy and her left ventricular ejection fraction is 60-65% as of her last echocardiogram on September 30th. The patient has concerns about hair and skin health, particularly in relation to hair coloring and skin care products. She is also experiencing an issue with her right big toe, which has been causing discomfort.   Patient Active Problem List   Diagnosis Date Noted   Port-A-Cath in place 06/03/2022   Genetic testing 05/30/2022   Therapeutic drug monitoring 05/27/2022   Malignant neoplasm of upper-outer quadrant of left breast in female, estrogen receptor negative (HCC) 05/19/2022    is allergic to gabapentin (once-daily) and penicillins.  MEDICAL HISTORY: Past Medical History:  Diagnosis Date   Anxiety    Colitis 04/07/2008   seen on CT (at ER visit)   Depression    Family history of adverse reaction to anesthesia    mother with h/o nausea   GERD (gastroesophageal reflux disease)    History of radiation therapy    Left breast-01/27/23-03/13/23- Dr.  Antony Blackbird   Hyperplastic colon polyp    Hypertension    Liver hemangioma 04/07/2008   seen on CT    SURGICAL HISTORY: Past Surgical History:  Procedure Laterality Date   "freezing of cervix"      ABDOMINAL HYSTERECTOMY     BREAST BIOPSY Left 05/15/2022   [place] LT BREAST BX W LOC DEV 1ST LESION IMG BX SPEC US GUIDE 05/15/2022 [facility]   BREAST BIOPSY  11/03/2022   MM LT RADIOACTIVE SEED LOC MAMMO GUIDE 11/03/2022 [facility]   BREAST BIOPSY  11/03/2022   MM LT RADIOACTIVE SEED EA ADD LESION LOC MAMMO GUIDE 11/03/2022 [facility]   BREAST BIOPSY Left 11/03/2022   [place] LT RADIOACTIVE SEED LOC 11/03/2022 [facility]   BREAST LUMPECTOMY WITH RADIOACTIVE SEED AND SENTINEL LYMPH NODE BIOPSY Left 11/04/2022   Procedure: LEFT BREAST SEED BRACKETED LUMPECTOMY, LEFT SENTINEL LYMPH NODE BIOPSY;  Surgeon: [name], MD;  Location: [facility];  Service: General;  Laterality: Left;  PEC BLOCK   BREAST RECONSTRUCTION Left 11/14/2022   Procedure: LEFT  ONCOPLASTIC BREAST RECONSTRUCTION;  Surgeon: Glenna Fellows, MD;  Location: [facility];  Service: Plastics;  Laterality: Left;   BREAST REDUCTION SURGERY Right 11/14/2022   Procedure: MAMMARY REDUCTION  (BREAST);  Surgeon: Glenna Fellows, MD;  Location: [facility];  Service: Plastics;  Laterality: Right;   CESAREAN SECTION     NASAL SINUS SURGERY     PORTACATH PLACEMENT N/A 05/29/2022   Procedure: INSERTION PORT-A-CATH;  Surgeon: [name], MD;  Location: [facility];  Service: General;  Laterality: N/A;   RADIOACTIVE SEED GUIDED AXILLARY SENTINEL LYMPH NODE Left 11/04/2022   Procedure: SEED TARGETED LEFT AXILLARY LYMPH NODE EXCISIONAL BIOPSY;  Surgeon: [name], MD;  Location:  SURGERY CENTER;  Service: General;  Laterality: Left;   right kneecap surgery +      SOCIAL HISTORY: Social History   Socioeconomic History   Marital status: Married    Spouse name: Not on file   Number of children: 0   Years of education: Not on file   Highest education level: Not on file  Occupational History   Occupation: Real[employer]  Tobacco Use   Smoking status: Never   Smokeless  tobacco: Never   Tobacco comments:    smoked 1 year while in college  Vaping Use   Vaping status: Never Used  Substance and Sexual Activity   Alcohol use: Yes    Alcohol/week: 14.0 standard drinks of alcohol    Types: 14 Glasses of wine per week    Comment: wine - daily   Drug use: No   Sexual activity: Not on file  Other Topics Concern   Not on file  Social History Narrative   1 caffeine drinks daily    Social Drivers of Health   Financial Resource Strain: Low Risk  (01/20/2022)   Received from [facility], [facility]   Overall Financial Resource Strain (CARDIA)    Difficulty of Paying Living Expenses: Not hard at all  Food Insecurity: No Food Insecurity (01/13/2023)   Hunger Vital Sign    Worried About Running Out of Food in the Last Year: Never true    Ran Out of Food in the Last Year: Never true  Transportation Needs: Not on file  Physical Activity: Insufficiently Active (01/20/2022)   Received from [facility], [facility]   Exercise Vital Sign    Days of Exercise per Week: 2 days  Minutes of Exercise per Session: 20 min  Stress: Stress Concern Present (01/20/2022)   Received from [facility], [facility] - Occupational Stress Questionnaire    Feeling of Stress : To some extent  Social Connections: Socially Integrated (01/20/2022)   Received from [facility], [facility]   Social Network    How would you rate your social network (family, work, friends)?: Good participation with social networks  Intimate Partner Violence: Not At Risk (01/13/2023)   Humiliation, Afraid, Rape, and Kick questionnaire    Fear of Current or Ex-Partner: No    Emotionally Abused: No    Physically Abused: No    Sexually Abused: No    FAMILY HISTORY: Family History  Problem Relation Age of Onset   Kidney cancer Mother        dx after 37; early stage   Hypertension Father    Heart disease Father    Colon polyps Father     Lung cancer Father 36       smoking hx   Colon polyps Sister    Hypertension Brother    Cancer Maternal Uncle        ? blood cancer; dx after 50   Cancer Maternal Grandmother        ? gallbladder ? colon; d. after 60   Diabetes Paternal Grandmother     Review of Systems  Constitutional:  Negative for appetite change, chills, fatigue, fever and unexpected weight change.  HENT:   Negative for hearing loss, lump/mass and trouble swallowing.   Eyes:  Negative for eye problems and icterus.  Respiratory:  Negative for chest tightness, cough and shortness of breath.   Cardiovascular:  Negative for chest pain, leg swelling and palpitations.  Gastrointestinal:  Negative for abdominal distention, abdominal pain, constipation, diarrhea, nausea and vomiting.  Endocrine: Negative for hot flashes.  Genitourinary:  Negative for difficulty urinating.   Musculoskeletal:  Negative for arthralgias.  Skin:  Negative for itching and rash.  Neurological:  Negative for dizziness, extremity weakness, headaches and numbness.  Hematological:  Negative for adenopathy. Does not bruise/bleed easily.  Psychiatric/Behavioral:  Negative for depression. The patient is not nervous/anxious.       PHYSICAL EXAMINATION  Vitals:   04/23/23 1316  BP: 125/67  Pulse: 90  Resp: 18  Temp: 98.6 F (37 C)  SpO2: 100%    Physical Exam Constitutional:      General: She is not in acute distress.    Appearance: Normal appearance. She is not toxic-appearing.  HENT:     Head: Normocephalic and atraumatic.     Mouth/Throat:     Mouth: Mucous membranes are moist.     Pharynx: Oropharynx is clear. No oropharyngeal exudate or posterior oropharyngeal erythema.  Eyes:     General: No scleral icterus. Cardiovascular:     Rate and Rhythm: Normal rate and regular rhythm.     Pulses: Normal pulses.     Heart sounds: Normal heart sounds.  Pulmonary:     Effort: Pulmonary effort is normal.     Breath sounds: Normal breath  sounds.  Abdominal:     General: Abdomen is flat. Bowel sounds are normal. There is no distension.     Palpations: Abdomen is soft.     Tenderness: There is no abdominal tenderness.  Musculoskeletal:        General: No swelling.     Cervical back: Neck supple.  Lymphadenopathy:     Cervical: No  cervical adenopathy.  Skin:    General: Skin is warm and dry.     Findings: No rash.  Neurological:     General: No focal deficit present.     Mental Status: She is alert.  Psychiatric:        Mood and Affect: Mood normal.        Behavior: Behavior normal.     LABORATORY DATA:  CBC    Component Value Date/Time   WBC 5.6 04/23/2023 1233   WBC 13.5 (H) 01/21/2009 0826   RBC 3.33 (L) 04/23/2023 1233   HGB 11.2 (L) 04/23/2023 1233   HCT 33.0 (L) 04/23/2023 1233   PLT 235 04/23/2023 1233   MCV 99.1 04/23/2023 1233   MCH 33.6 04/23/2023 1233   MCHC 33.9 04/23/2023 1233   RDW 13.8 04/23/2023 1233   LYMPHSABS 1.1 04/23/2023 1233   MONOABS 0.5 04/23/2023 1233   EOSABS 0.2 04/23/2023 1233   BASOSABS 0.0 04/23/2023 1233    CMP     Component Value Date/Time   NA 142 04/23/2023 1233   K 3.8 04/23/2023 1233   CL 107 04/23/2023 1233   CO2 28 04/23/2023 1233   GLUCOSE 123 (H) 04/23/2023 1233   BUN 22 04/23/2023 1233   CREATININE 0.95 04/23/2023 1233   CALCIUM 9.5 04/23/2023 1233   PROT 6.9 04/23/2023 1233   ALBUMIN 4.3 04/23/2023 1233   AST 23 04/23/2023 1233   ALT 28 04/23/2023 1233   ALKPHOS 88 04/23/2023 1233   BILITOT 0.4 04/23/2023 1233   GFRNONAA >60 04/23/2023 1233   GFRAA  01/21/2009 0826    >60        The eGFR has been calculated using the MDRD equation. This calculation has not been validated in all clinical situations. eGFR's persistently <60 mL/min signify possible Chronic Kidney Disease.     ASSESSMENT and THERAPY PLAN:   Malignant neoplasm of upper-outer quadrant of left breast in female, estrogen receptor negative (HCC) stage IIB ER/PR negative, HER2  positive breast cancer here today for evaluation and follow-up prior to receiving cycle 6 of neoadjuvant Abraxane, carboplatin, Herceptin, and Perjeta.   Treatment plan: 1. Neoadjuvant chemotherapy with TCH Perjeta 6 cycles completed 09/18/2022 (Taxotere switched with Abraxane) followed by Herceptin Perjeta maintenance versus Kadcyla maintenance (based on response to neoadjuvant chemo) for 1 year 2. 11/04/2022: Left lumpectomy: No residual invasive cancer identified.  0/4 lymph nodes negative pathologic complete response 3. Followed by adjuvant radiation therapy started 01/28/2023-03/13/2023 ------------------------------------------------------------------------ Current treatment: Herceptin Perjeta maintenance to complete 06/04/2023 Plan to initiate guardant reveal MRD monitoring  Breast Cancer Undergoing Herceptin Progetta treatment, with completion expected on February 27th. Last echocardiogram on September 30th showed a left ventricular ejection fraction of 60-65%. Radiation therapy completed in December. -Continue Herceptin Progetta treatment as planned. -Order an echocardiogram to monitor cardiac function during treatment.  Hair and Nail Changes Post-Chemotherapy Experiencing changes in hair and nail health post-chemotherapy. Discussed the use of high-quality hair color and collagen supplements for hair and nail health. -Advise to use high-quality hair color for hair dyeing. -Consider collagen supplements for hair and nail health.  Toe Lesion Lesion on the right big toe, previously evaluated by primary care physician and dermatologist. -Referred to Dr. Loreta Ave for further evaluation and management.  General Health Maintenance -Schedule mammogram.   All questions were answered. The patient knows to call the clinic with any problems, questions or concerns. We can certainly see the patient much sooner if necessary.  Total encounter time:20 minutes*in face-to-face visit  time, chart  review, lab review, care coordination, order entry, and documentation of the encounter time.    Lillard Anes, NP 04/24/23 4:13 PM Medical Oncology and Hematology [facility] [address] [place], [place] [address] Tel. [phone]    Fax. [phone]  *Total Encounter Time as defined by the Centers for Medicare and Medicaid Services includes, in addition to the face-to-face time of a patient visit (documented in the note above) non-face-to-face time: obtaining and reviewing outside history, ordering and reviewing medications, tests or procedures, care coordination (communications with other health care professionals or caregivers) and documentation in the medical record.

## 2023-04-24 ENCOUNTER — Other Ambulatory Visit: Payer: Self-pay | Admitting: General Surgery

## 2023-04-24 ENCOUNTER — Encounter: Payer: Self-pay | Admitting: Hematology and Oncology

## 2023-04-24 NOTE — Assessment & Plan Note (Signed)
stage IIB ER/PR negative, HER2 positive breast cancer here today for evaluation and follow-up prior to receiving cycle 6 of neoadjuvant Abraxane, carboplatin, Herceptin, and Perjeta.   Treatment plan: 1. Neoadjuvant chemotherapy with TCH Perjeta 6 cycles completed 09/18/2022 (Taxotere switched with Abraxane) followed by Herceptin Perjeta maintenance versus Kadcyla maintenance (based on response to neoadjuvant chemo) for 1 year 2. 11/04/2022: Left lumpectomy: No residual invasive cancer identified.  0/4 lymph nodes negative pathologic complete response 3. Followed by adjuvant radiation therapy started 01/28/2023-03/13/2023 ------------------------------------------------------------------------ Current treatment: Herceptin Perjeta maintenance to complete 06/04/2023 Plan to initiate guardant reveal MRD monitoring  Breast Cancer Undergoing Herceptin Progetta treatment, with completion expected on February 27th. Last echocardiogram on September 30th showed a left ventricular ejection fraction of 60-65%. Radiation therapy completed in December. -Continue Herceptin Progetta treatment as planned. -Order an echocardiogram to monitor cardiac function during treatment.  Hair and Nail Changes Post-Chemotherapy Experiencing changes in hair and nail health post-chemotherapy. Discussed the use of high-quality hair color and collagen supplements for hair and nail health. -Advise to use high-quality hair color for hair dyeing. -Consider collagen supplements for hair and nail health.  Toe Lesion Lesion on the right big toe, previously evaluated by primary care physician and dermatologist. -Referred to Dr. Loreta Ave for further evaluation and management.  General Health Maintenance -Schedule mammogram.

## 2023-04-30 ENCOUNTER — Ambulatory Visit: Payer: 59 | Admitting: Physical Therapy

## 2023-04-30 ENCOUNTER — Encounter: Payer: Self-pay | Admitting: Physical Therapy

## 2023-04-30 DIAGNOSIS — I890 Lymphedema, not elsewhere classified: Secondary | ICD-10-CM | POA: Diagnosis not present

## 2023-04-30 DIAGNOSIS — L599 Disorder of the skin and subcutaneous tissue related to radiation, unspecified: Secondary | ICD-10-CM

## 2023-04-30 DIAGNOSIS — R293 Abnormal posture: Secondary | ICD-10-CM

## 2023-04-30 DIAGNOSIS — Z171 Estrogen receptor negative status [ER-]: Secondary | ICD-10-CM

## 2023-04-30 DIAGNOSIS — R2681 Unsteadiness on feet: Secondary | ICD-10-CM

## 2023-04-30 NOTE — Therapy (Addendum)
 OUTPATIENT PHYSICAL THERAPY BREAST CANCER POST OP FOLLOW UP   Patient Name: Erika [name] MRN: [id number] DOB:September 17, 1960, 63 y.o., female Today's Date: 04/30/2023  END OF SESSION:  PT End of Session - 04/30/23 1109     Visit Number 4    Number of Visits 9    Date for PT Re-Evaluation 05/05/23    PT Start Time 1105    PT Stop Time 1149    PT Time Calculation (min) 44 min    Activity Tolerance Patient tolerated treatment well    Behavior During Therapy [facility] for tasks assessed/performed             Past Medical History:  Diagnosis Date   Anxiety    Colitis 04/07/2008   seen on CT (at ER visit)   Depression    Family history of adverse reaction to anesthesia    mother with h/o nausea   GERD (gastroesophageal reflux disease)    History of radiation therapy    Left breast-01/27/23-03/13/23- Dr. Lynwood Nasuti   Hyperplastic colon polyp    Hypertension    Liver hemangioma 04/07/2008   seen on CT   Past Surgical History:  Procedure Laterality Date   freezing of cervix     ABDOMINAL HYSTERECTOMY     BREAST BIOPSY Left 05/15/2022   US  LT BREAST BX W LOC DEV 1ST LESION IMG BX SPEC US  GUIDE 05/15/2022 [facility]   BREAST BIOPSY  11/03/2022   MM LT RADIOACTIVE SEED LOC MAMMO GUIDE 11/03/2022 [facility]   BREAST BIOPSY  11/03/2022   MM LT RADIOACTIVE SEED EA ADD LESION LOC MAMMO GUIDE 11/03/2022 [facility]   BREAST BIOPSY Left 11/03/2022   US  LT RADIOACTIVE SEED LOC 11/03/2022 [facility]   BREAST LUMPECTOMY WITH RADIOACTIVE SEED AND SENTINEL LYMPH NODE BIOPSY Left 11/04/2022   Procedure: LEFT BREAST SEED BRACKETED LUMPECTOMY, LEFT SENTINEL LYMPH NODE BIOPSY;  Surgeon: Aron Shoulders, MD;  Location: [facility];  Service: General;  Laterality: Left;  PEC BLOCK   BREAST RECONSTRUCTION Left 11/14/2022   Procedure: LEFT  ONCOPLASTIC BREAST RECONSTRUCTION;  Surgeon: Arelia Filippo, MD;  Location: [facility];  Service:  Plastics;  Laterality: Left;   BREAST REDUCTION SURGERY Right 11/14/2022   Procedure: MAMMARY REDUCTION  (BREAST);  Surgeon: Arelia Filippo, MD;  Location: [facility];  Service: Plastics;  Laterality: Right;   CESAREAN SECTION     NASAL SINUS SURGERY     PORTACATH PLACEMENT N/A 05/29/2022   Procedure: INSERTION PORT-A-CATH;  Surgeon: Aron Shoulders, MD;  Location: [facility];  Service: General;  Laterality: N/A;   RADIOACTIVE SEED GUIDED AXILLARY SENTINEL LYMPH NODE Left 11/04/2022   Procedure: SEED TARGETED LEFT AXILLARY LYMPH NODE EXCISIONAL BIOPSY;  Surgeon: Aron Shoulders, MD;  Location: [facility];  Service: General;  Laterality: Left;   right kneecap surgery +     Patient Active Problem List   Diagnosis Date Noted   Port-A-Cath in place 06/03/2022   Genetic testing 05/30/2022   Therapeutic drug monitoring 05/27/2022   Malignant neoplasm of upper-outer quadrant of left breast in female, estrogen receptor negative (HCC) 05/19/2022    PCP: Charmaine Bright, PA-C  REFERRING PROVIDER: Shoulders Aron, MD  REFERRING DIAG: L breast cancer  THERAPY DIAG:  Lymphedema, not elsewhere classified  Disorder of the skin and subcutaneous tissue related to radiation, unspecified  Unsteadiness on feet  Abnormal posture  Malignant neoplasm of upper-outer quadrant of left breast in female, estrogen receptor negative (HCC)  Rationale for Evaluation and Treatment: Rehabilitation  ONSET DATE: 05/08/22  SUBJECTIVE:                                                                                                                                                                                           SUBJECTIVE STATEMENT: The swelling is good. The balance is about the same.   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 It measures 4.2 cm and is located in the upper outer quadrant. It is ER/PR negative and  HER2 positive with a Ki67 of 35%. 11/04/22- L breast lumpectomy and SLNB 0/4, 11/14/22- bilateral breast reduction. Had completed chemo, has 3 more radiation sessions as of 03/10/23 and will complete immunotherapy in Feb 2025  PATIENT GOALS:  Reassess how my recovery is going related to arm function, pain, and swelling.  PAIN:  Are you having pain? 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   OBJECTIVE:   PATIENT SURVEYS:  QUICK DASH:     OBSERVATIONS:   Media Information  Document Information  Photos  L wound healing  03/10/2023 15:38  Attached To:  Outpatient Rehab on 03/10/23 with Lanis Carbon, Erika [name], PT  Source Information  Rinard, Erika [name], PT  [facility]  Document History    Healing wound on L breast - mild lymphedema present in L inferior and lateral breast extending to axillary area  POSTURE:  Forward head and rounded shoulders posture   UPPER EXTREMITY AROM/PROM:   A/PROM RIGHT   eval   RIGHT 03/10/23  Shoulder extension 58 85  Shoulder flexion 157 165  Shoulder abduction 162 180  Shoulder internal rotation 72 81  Shoulder external rotation 90 95                          (Blank rows = not tested)   A/PROM LEFT   eval LEFT 03/10/23  Shoulder extension 66 86  Shoulder flexion 140 170  Shoulder abduction 163 175  Shoulder internal rotation 67 75  Shoulder external rotation 82 84                          (Blank rows = not tested)  SLS: 11 sec on L, 4 sec; tandem stance initially difficult to obtain but  then pt able to hold with sway, standing with narrow base of support and eyes closed - able to but with increased sway in all directions 04/21/23- 11 on R, 11 sec on L   CERVICAL AROM: All within normal limits   UPPER EXTREMITY  STRENGTH: WFL   LYMPHEDEMA ASSESSMENTS:    LANDMARK RIGHT   eval  10 cm proximal to olecranon process 28.9  Olecranon process 24.8  10 cm proximal to ulnar styloid process 20.4  Just proximal to ulnar styloid process 15.4  Across hand at thumb web space 18.3  At base of 2nd digit 5.8  (Blank rows = not tested)   LANDMARK LEFT   eval LEFT 03/10/23  10 cm proximal to olecranon process 28.3 24.6  Olecranon process 25 24.1  10 cm proximal to ulnar styloid process 19.4 17.3  Just proximal to ulnar styloid process 15.2 14.7  Across hand at thumb web space 18.2 18  At base of 2nd digit 5.8 5.5  (Blank rows = not tested)  Surgery type/Date: 11/04/22- L breast lumpectomy and SLNB 0/4, 11/14/22- bilateral breast reduction Number of lymph nodes removed: 0/4  Current/past treatment (chemo, radiation, hormone therapy): completed chemo, will complete radiation on 03/13/23, will complete immunotherapy in Feb 2025  Other symptoms:  Heaviness/tightness No Pain No Pitting edema No Infections Yes numerous infections Decreased scar mobility No Stemmer sign No  TREATMENT PERFORMED: 04/30/23: In // bars: heel/toe on air ex beam x 4 with pt returning therapist demo and requiring occasional HHA on // bars, retro heel toe walking x 4 in // bars- no foam with increased difficulty with this, standing in tandem stance with eyes closed with occasional HHA on // bars for stability, standing on air ex with narrow BOS eyes closed with increased sway, unable to maintain tandem stance on air ex with eyes closed due to loss of balance, standing on air ex pad - 3 ways raises x 10 reps each - pt had to have a seated recovery period half way through due to increased dizziness - pt reports issues with vertigo occasionally, standing in the middle of colored circles with cones on top and had pt tap cone with foot without smashing cone- pt had increased difficulty doing this with weight through the L LE   04/21/23: Had pt  demonstrate self MLD today to L breast. Provided v/c and t/c for correct skin stretch technique and sequence. She had forgotten to made circles at her groin and axilla and initially was not using her inter axillary pathway. Educated pt in these steps and provided cues for pressure and direction of stretch. No edema visible today. Issued 1/2 grey foam in thick stockinette for pt to wear in her bra to keep her bra from digging in to her skin. Assessed balance by repeating SLS again - she is still unable to maintain longer than 11 sec bilaterally Discussed role of neuropathy and how this affects balance and appropriate exercises to improve this In // bars: heel/toe on air ex beam x 3 with pt returning therapist demo and requiring occasional HHA on // bars, standing on air ex pad - 3 ways raises x 5 reps each, practiced standing in tandem though this was not challenging like dynamic movements were Discussed using a pillow at home in place of foam Measured pt for compression sleeve - Sigvaris secure S2 and sent pt link through [employer] for self pay for prophylactic sleeve  04/06/23: In supine: Short neck, 5 diaphragmatic breaths, R  axillary nodes and establishment of interaxillary pathway, L inguinal nodes and establishment of axilloinguinal pathway, then L breast moving fluid towards pathways spending extra time in any areas of fibrosis then retracing all steps. Instructed pt in anatomy and physiology of the lymphatic system throughout and had her return demonstrate steps. Issued handout for her to begin to practice at home.  Scar massage to entire breast scar on L to help decrease fibrosis.   PATIENT EDUCATION:  Education details: ABC class, lymphedema risk reduction, holding PT until skin has healed from radiation, how chemo affects proprioception  Person educated: Patient Education method: Explanation Education comprehension: verbalized understanding  HOME EXERCISE PROGRAM: Reviewed previously given  post op HEP.   ASSESSMENT:  CLINICAL IMPRESSION: Pt has been practicing SLS at home and her SLS has improved today. She was able to stand for 12 sec. Continued to progress balance exercises today with increased challenge noted when visual input is removed. Pt will practice this more at home. Will place pt on hold for 3 weeks per pt request while she practices at home and will reassess in three weeks.   Pt will benefit from skilled therapeutic intervention to improve on the following deficits: Decreased knowledge of precautions, impaired UE functional use, pain, decreased ROM, postural dysfunction.   PT treatment/interventions: ADL/Self care home management, [phone]- PT Re-evaluation, 97110-Therapeutic exercises, 97530- Therapeutic activity, [id number]- Neuromuscular re-education, 97535- Self Care, [id number]- Manual therapy, Patient/Family education, Manual lymph drainage, Scar mobilization, and Compression bandaging   GOALS: Goals reviewed with patient? Yes  LONG TERM GOALS:  (STG=LTG)  GOALS Name Target Date  Goal status  1 Pt will demonstrate she has regained full shoulder ROM and function post operatively compared to baselines.  Baseline: 03/10/23  MET  2 Pt will be independent in self MLD for long term management of lymphedema. 05/05/23 INITIAL  3 Pt will obtain a compression bra for long term management of lymphedema. 05/05/23 MET 04/21/23  4 Pt will be able to stand on L and R feet for 30 sec without UE support to decrease fall risk. 05/05/23 INITIAL     PLAN:  PT FREQUENCY/DURATION: reassess in 3 weeks  PLAN FOR NEXT SESSION: assess goals, re test SLS and determine to continue or d/c,, does she need vestibular rehab    [facility]  [address]  [place] [address]  [phone]  After Breast Cancer Class It is recommended you attend the ABC class to be educated on lymphedema risk reduction. This class is free of charge and lasts for 1 hour. It is a  1-time class. You will need to download the TEAMS app either on your phone or computer. We will send you a link the night before or the morning of the class. You should be able to click on that link to join the class. This is not a confidential class. You don't have to turn your camera on, but other participants may be able to see your email address.  Scar massage You can begin gentle scar massage to you incision sites. Gently place one hand on the incision and move the skin (without sliding on the skin) in various directions. Do this for a few minutes and then you can gently massage either coconut oil or vitamin E cream into the scars.  Compression garment You should continue wearing your compression bra until you feel like you no longer have swelling.  Home exercise Program Continue doing the exercises you were given until you  feel like you can do them without feeling any tightness at the end.   Walking Program Studies show that 30 minutes of walking per day (fast enough to elevate your heart rate) can significantly reduce the risk of a cancer recurrence. If you can't walk due to other medical reasons, we encourage you to find another activity you could do (like a stationary bike or water exercise).  Posture After breast cancer surgery, people frequently sit with rounded shoulders posture because it puts their incisions on slack and feels better. If you sit like this and scar tissue forms in that position, you can become very tight and have pain sitting or standing with good posture. Try to be aware of your posture and sit and stand up tall to heal properly.  Follow up PT: It is recommended you return every 3 months for the first 3 years following surgery to be assessed on the SOZO machine for an L-Dex score. This helps prevent clinically significant lymphedema in 95% of patients. These follow up screens are 10 minute appointments that you are not billed for.  Erika [name],  PT 04/30/2023, 12:14 PM  PHYSICAL THERAPY DISCHARGE SUMMARY  Visits from Start of Care: 4  Current functional level related to goals / functional outcomes: See above   Remaining deficits: See above    Education / Equipment: HEP, MLD   Patient agrees to discharge. Patient goals were partially met. Patient is being discharged due to not returning since the last visit.  Erika [name], [place] 10/27/23 4:38 PM

## 2023-05-01 ENCOUNTER — Ambulatory Visit (HOSPITAL_COMMUNITY)
Admission: RE | Admit: 2023-05-01 | Discharge: 2023-05-01 | Disposition: A | Discharge status: Home / Self Care | Payer: 59 | Source: Ambulatory Visit | Attending: Adult Health | Admitting: Adult Health

## 2023-05-01 ENCOUNTER — Other Ambulatory Visit: Payer: Self-pay

## 2023-05-01 DIAGNOSIS — Z09 Encounter for follow-up examination after completed treatment for conditions other than malignant neoplasm: Secondary | ICD-10-CM | POA: Insufficient documentation

## 2023-05-01 DIAGNOSIS — Z0189 Encounter for other specified special examinations: Secondary | ICD-10-CM

## 2023-05-01 DIAGNOSIS — C50919 Malignant neoplasm of unspecified site of unspecified female breast: Secondary | ICD-10-CM | POA: Insufficient documentation

## 2023-05-01 LAB — ECHOCARDIOGRAM COMPLETE
AR max vel: 3.3 cm2
AV Area VTI: 3.16 cm2
AV Area mean vel: 3.3 cm2
AV Mean grad: 4 mm[Hg]
AV Peak grad: 6.8 mm[Hg]
AV Vena cont: 0.3 cm
Ao pk vel: 1.3 m/s
Area-P 1/2: 5.54 cm2
S' Lateral: 2.25 cm

## 2023-05-01 NOTE — Progress Notes (Signed)
*  PRELIMINARY RESULTS* Echocardiogram 2D Echocardiogram has been performed.  Erika [name] 05/01/2023, 11:50 AM

## 2023-05-04 ENCOUNTER — Other Ambulatory Visit: Payer: Self-pay | Admitting: Adult Health

## 2023-05-04 ENCOUNTER — Ambulatory Visit: Payer: 59 | Admitting: Podiatry

## 2023-05-04 DIAGNOSIS — K769 Liver disease, unspecified: Secondary | ICD-10-CM

## 2023-05-04 NOTE — Progress Notes (Signed)
Erika [name] underwent an echocardiogram on May 01, 2023 that demonstrated an EF of 70 to 75%.  Dr. Gala Romney called me however because he incidentally noted a vascular lesion abutting the liver.  She will need dedicated abdominal imaging to further evaluate this.  I reviewed this with Erika [name] today and placed orders for CT abdomen pelvis with contrast.  He verbalized understanding of this.  Lillard Anes, NP 05/04/23 12:59 PM Medical Oncology and Hematology [facility] [address] [place], [place] [address] Tel. [phone]    Fax. [phone]

## 2023-05-08 ENCOUNTER — Ambulatory Visit (HOSPITAL_COMMUNITY)
Admission: RE | Admit: 2023-05-08 | Discharge: 2023-05-08 | Disposition: A | Discharge status: Home / Self Care | Payer: 59 | Source: Ambulatory Visit | Attending: Adult Health | Admitting: Adult Health

## 2023-05-08 DIAGNOSIS — K769 Liver disease, unspecified: Secondary | ICD-10-CM | POA: Diagnosis present

## 2023-05-08 MED ORDER — IOHEXOL 300 MG/ML  SOLN
100.0000 mL | Freq: Once | INTRAMUSCULAR | Status: AC | PRN
  Administered 2023-05-08: 100 mL via INTRAVENOUS

## 2023-05-11 ENCOUNTER — Encounter: Payer: Self-pay | Admitting: Adult Health

## 2023-05-12 ENCOUNTER — Ambulatory Visit (HOSPITAL_COMMUNITY): Payer: 59

## 2023-05-14 ENCOUNTER — Inpatient Hospital Stay: Payer: 59 | Attending: Hematology and Oncology

## 2023-05-14 ENCOUNTER — Other Ambulatory Visit: Payer: Self-pay | Admitting: Adult Health

## 2023-05-14 VITALS — BP 121/79 | HR 74 | Temp 98.0°F | Resp 14 | Wt 166.5 lb

## 2023-05-14 DIAGNOSIS — Z5112 Encounter for antineoplastic immunotherapy: Secondary | ICD-10-CM | POA: Diagnosis present

## 2023-05-14 DIAGNOSIS — Z1722 Progesterone receptor negative status: Secondary | ICD-10-CM | POA: Diagnosis not present

## 2023-05-14 DIAGNOSIS — Z1731 Human epidermal growth factor receptor 2 positive status: Secondary | ICD-10-CM | POA: Diagnosis not present

## 2023-05-14 DIAGNOSIS — Z923 Personal history of irradiation: Secondary | ICD-10-CM | POA: Insufficient documentation

## 2023-05-14 DIAGNOSIS — K769 Liver disease, unspecified: Secondary | ICD-10-CM

## 2023-05-14 DIAGNOSIS — Z171 Estrogen receptor negative status [ER-]: Secondary | ICD-10-CM | POA: Insufficient documentation

## 2023-05-14 DIAGNOSIS — C50412 Malignant neoplasm of upper-outer quadrant of left female breast: Secondary | ICD-10-CM

## 2023-05-14 DIAGNOSIS — Z9221 Personal history of antineoplastic chemotherapy: Secondary | ICD-10-CM | POA: Diagnosis not present

## 2023-05-14 MED ORDER — TRASTUZUMAB-ANNS CHEMO 150 MG IV SOLR
6.0000 mg/kg | Freq: Once | INTRAVENOUS | Status: AC
  Administered 2023-05-14: 420 mg via INTRAVENOUS
  Filled 2023-05-14: qty 20

## 2023-05-14 MED ORDER — ACETAMINOPHEN 325 MG PO TABS
650.0000 mg | ORAL_TABLET | Freq: Once | ORAL | Status: AC
  Administered 2023-05-14: 650 mg via ORAL
  Filled 2023-05-14: qty 2

## 2023-05-14 MED ORDER — SODIUM CHLORIDE 0.9% FLUSH
10.0000 mL | INTRAVENOUS | Status: DC | PRN
  Administered 2023-05-14: 10 mL

## 2023-05-14 MED ORDER — HEPARIN SOD (PORK) LOCK FLUSH 100 UNIT/ML IV SOLN
500.0000 [IU] | Freq: Once | INTRAVENOUS | Status: AC | PRN
  Administered 2023-05-14: 500 [IU]

## 2023-05-14 MED ORDER — DIPHENHYDRAMINE HCL 25 MG PO CAPS
25.0000 mg | ORAL_CAPSULE | Freq: Once | ORAL | Status: AC
  Administered 2023-05-14: 25 mg via ORAL
  Filled 2023-05-14: qty 1

## 2023-05-14 MED ORDER — SODIUM CHLORIDE 0.9 % IV SOLN
420.0000 mg | Freq: Once | INTRAVENOUS | Status: AC
  Administered 2023-05-14: 420 mg via INTRAVENOUS
  Filled 2023-05-14: qty 14

## 2023-05-14 MED ORDER — SODIUM CHLORIDE 0.9 % IV SOLN: Freq: Once | INTRAVENOUS | Status: AC

## 2023-05-14 NOTE — Progress Notes (Signed)
 Reviewed results of CT Scan and placed orders of MRI liver w/wo contrast to better characterize the indeterminate liver lesion.  (This was reviewed with Dr. Odean who agrees with plan and helped formulate it)  Morna Kendall, NP 05/14/23 3:06 PM Medical Oncology and Hematology [facility] [address] [place], [place] [address] Tel. [phone]    Fax. [phone]

## 2023-05-14 NOTE — Progress Notes (Signed)
Patient declined post Perjeta observation.  Tolerated treatment well without incident.  VSS at discharge.  Ambulated to lobby.

## 2023-05-18 ENCOUNTER — Encounter: Payer: Self-pay | Admitting: Hematology and Oncology

## 2023-05-21 ENCOUNTER — Encounter: Payer: Self-pay | Admitting: Physical Therapy

## 2023-05-21 ENCOUNTER — Encounter: Payer: Self-pay | Admitting: Hematology and Oncology

## 2023-05-21 ENCOUNTER — Encounter: Payer: Self-pay | Admitting: Adult Health

## 2023-05-27 ENCOUNTER — Ambulatory Visit
Admission: RE | Admit: 2023-05-27 | Discharge: 2023-05-27 | Discharge status: Home / Self Care | Payer: 59 | Source: Ambulatory Visit | Attending: Adult Health | Admitting: Adult Health

## 2023-05-27 DIAGNOSIS — C50412 Malignant neoplasm of upper-outer quadrant of left female breast: Secondary | ICD-10-CM

## 2023-05-27 HISTORY — DX: Personal history of antineoplastic chemotherapy: Z92.21

## 2023-05-27 HISTORY — DX: Personal history of irradiation: Z92.3

## 2023-05-27 HISTORY — DX: Malignant neoplasm of unspecified site of unspecified female breast: C50.919

## 2023-05-28 ENCOUNTER — Encounter: Payer: Self-pay | Admitting: *Deleted

## 2023-05-28 DIAGNOSIS — C50412 Malignant neoplasm of upper-outer quadrant of left female breast: Secondary | ICD-10-CM

## 2023-05-29 ENCOUNTER — Telehealth: Payer: Self-pay | Admitting: Adult Health

## 2023-05-29 ENCOUNTER — Ambulatory Visit
Admission: RE | Admit: 2023-05-29 | Discharge: 2023-05-29 | Disposition: A | Discharge status: Home / Self Care | Payer: 59 | Source: Ambulatory Visit | Attending: Adult Health | Admitting: Adult Health

## 2023-05-29 DIAGNOSIS — Z171 Estrogen receptor negative status [ER-]: Secondary | ICD-10-CM

## 2023-05-29 DIAGNOSIS — K769 Liver disease, unspecified: Secondary | ICD-10-CM

## 2023-05-29 MED ORDER — GADOPICLENOL 0.5 MMOL/ML IV SOLN
7.0000 mL | Freq: Once | INTRAVENOUS | Status: AC | PRN
  Administered 2023-05-29: 7 mL via INTRAVENOUS

## 2023-05-29 NOTE — Telephone Encounter (Signed)
Called patient to schedule upcoming appointment. Patient scheduled appointment and is aware of all appointment details.

## 2023-05-30 ENCOUNTER — Other Ambulatory Visit: Payer: Self-pay

## 2023-06-03 ENCOUNTER — Ambulatory Visit
Admission: RE | Admit: 2023-06-03 | Discharge: 2023-06-03 | Disposition: A | Discharge status: Home / Self Care | Payer: 59 | Source: Ambulatory Visit | Attending: Nurse Practitioner | Admitting: Nurse Practitioner

## 2023-06-03 ENCOUNTER — Ambulatory Visit (INDEPENDENT_AMBULATORY_CARE_PROVIDER_SITE_OTHER): Payer: 59

## 2023-06-03 VITALS — BP 127/89 | HR 80 | Temp 98.8°F | Resp 15

## 2023-06-03 DIAGNOSIS — W19XXXA Unspecified fall, initial encounter: Secondary | ICD-10-CM | POA: Diagnosis not present

## 2023-06-03 DIAGNOSIS — S0083XA Contusion of other part of head, initial encounter: Secondary | ICD-10-CM | POA: Diagnosis not present

## 2023-06-03 DIAGNOSIS — H05231 Hemorrhage of right orbit: Secondary | ICD-10-CM

## 2023-06-03 DIAGNOSIS — M79672 Pain in left foot: Secondary | ICD-10-CM

## 2023-06-03 NOTE — ED Triage Notes (Addendum)
 Pt presents with a bruise to the rt eye and a headache. Pt states she fell on Monday, she hit her head and injured her lt foot. Pt states she has soreness in lt ankle.

## 2023-06-03 NOTE — ED Provider Notes (Signed)
 [facility]    CSN: [id number] Arrival date & time: 06/03/23  1634      History   Chief Complaint Chief Complaint  Patient presents with   Foot Injury    I fell and hit my head and injured my foot on Monday this week. I have swelling on both. Foot is painfull on the top bone. - Entered by patient    HPI Erika [name] is a 63 y.o. female.   Subjective:  Erika [name]. Hook is a 63 year old female with a history of breast cancer, status post-lumpectomy, chemotherapy, and radiation, and currently undergoing immunotherapy. 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 This resulted in a large contusion to her forehead with swelling and bruising, along with mild posterior neck pain and left foot pain. She also experienced a headache, which completely resolved after taking Motrin. Initially, the foot pain was severe, making weightbearing difficult, but it has improved. This morning, she noticed bruising around her right eye, which is new and has prompted her visit to urgent care. She denies further headaches, dizziness, blurred vision, eye pain, light sensitivity, paresthesias, nausea, or vomiting, and has been applying ice to the injured areas.  The following portions of the patient's history were reviewed and updated as appropriate: allergies, current medications, past family history, past medical history, past social history, past surgical history, and problem list.          Past Medical History:  Diagnosis Date   Anxiety    Breast cancer (HCC)    Colitis 04/07/2008   seen on CT (at ER visit)   Depression    Family history of adverse reaction to anesthesia    mother with h/o nausea   GERD  (gastroesophageal reflux disease)    History of radiation therapy    Left breast-01/27/23-03/13/23- Dr. Antony Blackbird   Hyperplastic colon polyp    Hypertension    Liver hemangioma 04/07/2008   seen on CT   Personal history of chemotherapy    Personal history of radiation therapy     Patient Active Problem List   Diagnosis Date Noted   Port-A-Cath in place 06/03/2022   Genetic testing 05/30/2022   Therapeutic drug monitoring 05/27/2022   Malignant neoplasm of upper-outer quadrant of left breast in female, estrogen receptor negative (HCC) 05/19/2022    Past Surgical History:  Procedure Laterality Date   "freezing of cervix"     ABDOMINAL HYSTERECTOMY     BREAST BIOPSY Left 05/15/2022   [place] LT BREAST BX W LOC DEV 1ST LESION IMG BX SPEC US GUIDE 05/15/2022 [facility]   BREAST BIOPSY  11/03/2022   MM LT RADIOACTIVE SEED LOC MAMMO GUIDE 11/03/2022 [facility]   BREAST BIOPSY  11/03/2022   MM LT RADIOACTIVE SEED EA ADD LESION LOC MAMMO GUIDE 11/03/2022 [facility]   BREAST BIOPSY Left 11/03/2022   [place] LT RADIOACTIVE SEED LOC 11/03/2022 [facility]   BREAST LUMPECTOMY Left 11/04/2022   BREAST LUMPECTOMY WITH RADIOACTIVE SEED AND SENTINEL LYMPH NODE BIOPSY Left 11/04/2022   Procedure: LEFT BREAST SEED BRACKETED LUMPECTOMY, LEFT SENTINEL LYMPH NODE BIOPSY;  Surgeon: [name], MD;  Location: [facility];  Service: General;  Laterality: Left;  PEC BLOCK   BREAST RECONSTRUCTION  Left 11/14/2022   Procedure: LEFT  ONCOPLASTIC BREAST RECONSTRUCTION;  Surgeon: Glenna Fellows, MD;  Location: [facility];  Service: Plastics;  Laterality: Left;   BREAST REDUCTION SURGERY Right 11/14/2022   Procedure: MAMMARY REDUCTION  (BREAST);  Surgeon: Glenna Fellows, MD;  Location: [facility];  Service: Plastics;  Laterality: Right;   CESAREAN SECTION     NASAL SINUS SURGERY     PORTACATH PLACEMENT N/A 05/29/2022   Procedure:  INSERTION PORT-A-CATH;  Surgeon: [name], MD;  Location: [facility];  Service: General;  Laterality: N/A;   RADIOACTIVE SEED GUIDED AXILLARY SENTINEL LYMPH NODE Left 11/04/2022   Procedure: SEED TARGETED LEFT AXILLARY LYMPH NODE EXCISIONAL BIOPSY;  Surgeon: [name], MD;  Location: [facility];  Service: General;  Laterality: Left;   REDUCTION MAMMAPLASTY Right 11/14/2022   right kneecap surgery +      OB History   No obstetric history on file.      Home Medications    Prior to Admission medications   Medication Sig Start Date End Date Taking? Authorizing Provider  ALPRAZolam (XANAX) 0.5 MG tablet 1 TABLET ORALLY TWICE DAILY AS NEEDED 30 DAYS Patient not taking: Reported on 04/23/2023    [provider]  aspirin EC 81 MG tablet Take 81 mg by mouth daily.    [provider]  cholestyramine (QUESTRAN) 4 g packet Take 1 packet (4 g total) by mouth 3 (three) times daily with meals. Patient not taking: Reported on 01/13/2023 10/23/22   Shanon Ace, PA-C  diphenoxylate-atropine (LOMOTIL) 2.5-0.025 MG tablet TAKE 1 TABLET BY MOUTH FOUR TIMES A DAY AS NEEDED FOR DIARRHEA/LOOSE STOOLS 03/09/23   [name], MD  lisinopril (ZESTRIL) 20 MG tablet Take 20 mg by mouth daily.    [provider]  loratadine (CLARITIN) 10 MG tablet Take 10 mg by mouth daily as needed for allergies.    [provider]  omeprazole (PRILOSEC) 20 MG capsule Take by mouth.    [provider]  rosuvastatin (CRESTOR) 20 MG tablet Take 20 mg by mouth daily.    [provider]  sertraline (ZOLOFT) 100 MG tablet Take 100 mg by mouth daily.    [provider]  triamcinolone ointment (KENALOG) 0.5 % Apply 1 Application topically 2 (two) times daily. Patient not taking: Reported on 04/23/2023 07/09/22   [name], MD    Family History Family History  Problem Relation Age of Onset   Kidney cancer Mother        dx after 93; early  stage   Hypertension Father    Heart disease Father    Colon polyps Father    Lung cancer Father 48       smoking hx   Colon polyps Sister    Hypertension Brother    Cancer Maternal Uncle        ? blood cancer; dx after 50   Cancer Maternal Grandmother        ? gallbladder ? colon; d. after 62   Diabetes Paternal Grandmother     Social History Social History   Tobacco Use   Smoking status: Never   Smokeless tobacco: Never   Tobacco comments:    smoked 1 year while in college  Vaping Use   Vaping status: Never Used  Substance Use Topics   Alcohol use: Yes    Alcohol/week: 14.0 standard drinks of alcohol    Types: 14 Glasses of wine per week    Comment:  wine - daily   Drug use: No     Allergies   Gabapentin (once-daily) and Penicillins   Review of Systems Review of Systems  HENT:  Negative for dental problem and ear discharge.   Eyes:  Negative for photophobia, pain and visual disturbance.  Gastrointestinal:  Negative for nausea and vomiting.  Musculoskeletal:  Positive for arthralgias, gait problem and neck pain.  Skin:  Positive for wound.  Neurological:  Positive for headaches. Negative for dizziness, tremors, seizures, syncope, facial asymmetry, speech difficulty, weakness, light-headedness and numbness.  Psychiatric/Behavioral:  Negative for confusion and decreased concentration.   All other systems reviewed and are negative.    Physical Exam Triage Vital Signs ED Triage Vitals  Encounter Vitals Group     BP 06/03/23 1644 127/89     Systolic BP Percentile --      Diastolic BP Percentile --      Pulse Rate 06/03/23 1644 80     Resp 06/03/23 1644 15     Temp 06/03/23 1644 98.8 F (37.1 C)     Temp Source 06/03/23 1644 Oral     SpO2 06/03/23 1644 94 %     Weight --      Height --      Head Circumference --      Peak Flow --      Pain Score 06/03/23 1643 5     Pain Loc --      Pain Education --      Exclude from Growth Chart --    No data  found.  Updated Vital Signs BP 127/89 (BP Location: Right Arm)   Pulse 80   Temp 98.8 F (37.1 C) (Oral)   Resp 15   SpO2 94%   Visual Acuity Right Eye Distance:   Left Eye Distance:   Bilateral Distance:    Right Eye Near:   Left Eye Near:    Bilateral Near:     Physical Exam Constitutional:      General: She is awake.     Appearance: Normal appearance. She is well-developed and well-groomed. She is not ill-appearing, toxic-appearing or diaphoretic.     Comments: Pleasant, interactive   HENT:     Head: Normocephalic. Contusion and right periorbital erythema present. No Battle's sign or left periorbital erythema.     Jaw: No tenderness or pain on movement.      Right Ear: Hearing, tympanic membrane, ear canal and external ear normal.     Left Ear: Hearing, tympanic membrane, ear canal and external ear normal.     Ears:     Comments: No fluid noted from the ears    Nose: Nose normal. No nasal deformity, signs of injury or nasal tenderness.     Comments: No fluid noted from the nose    Mouth/Throat:     Lips: Pink.     Mouth: Mucous membranes are moist.     Pharynx: Oropharynx is clear. Uvula midline.     Comments: No oral lesions or dental injury Eyes:     General: Lids are normal. Vision grossly intact. Gaze aligned appropriately. No visual field deficit.    Extraocular Movements: Extraocular movements intact.     Right eye: No nystagmus.     Left eye: No nystagmus.     Visual Fields: Right eye visual fields normal and left eye visual fields normal.  Cardiovascular:     Rate and Rhythm: Normal rate and regular rhythm.     Pulses: Normal pulses.  Heart sounds: Normal heart sounds.  Pulmonary:     Effort: Pulmonary effort is normal.     Breath sounds: Normal breath sounds.  Abdominal:     Palpations: Abdomen is soft.  Musculoskeletal:     Cervical back: Normal, full passive range of motion without pain, normal range of motion and neck supple.     Thoracic  back: Normal.     Lumbar back: Normal.     Left foot: Normal range of motion. Swelling and tenderness present. No deformity. Normal pulse.  Feet:     Left foot:     Skin integrity: Skin integrity normal.  Neurological:     General: No focal deficit present.     Mental Status: She is alert.     GCS: GCS eye subscore is 4. GCS verbal subscore is 5. GCS motor subscore is 6.     Cranial Nerves: Cranial nerves 2-12 are intact.     Sensory: Sensation is intact.     Motor: Motor function is intact.     Coordination: Coordination is intact.     Gait: Gait is intact.  Psychiatric:        Attention and Perception: Attention and perception normal.        Mood and Affect: Mood normal.        Speech: Speech normal.        Behavior: Behavior normal. Behavior is cooperative.        Thought Content: Thought content normal.        Cognition and Memory: Cognition and memory normal.        Judgment: Judgment normal.      UC Treatments / Results  Labs (all labs ordered are listed, but only abnormal results are displayed) Labs Reviewed - No data to display  EKG   Radiology DG Foot Complete Left Result Date: 06/03/2023 CLINICAL DATA:  Pain and swelling to left foot after fall 2 days ago EXAM: LEFT FOOT - COMPLETE 3+ VIEW COMPARISON:  None Available. FINDINGS: Frontal, oblique, and lateral views of the left foot are obtained. No acute fracture, subluxation, or dislocation. Joint spaces are well preserved. Soft tissues are unremarkable. IMPRESSION: 1. Unremarkable left foot. Electronically Signed   By: Sharlet Salina M.D.   On: 06/03/2023 18:28    Procedures Procedures (including critical care time)  Medications Ordered in UC Medications - No data to display  Initial Impression / Assessment and Plan / UC Course  I have reviewed the triage vital signs and the nursing notes.  Pertinent labs & imaging results that were available during my care of the patient were reviewed by me and considered  in my medical decision making (see chart for details).    63 year old female with a history of breast cancer, status post-lumpectomy, chemotherapy, and radiation, and currently undergoing immunotherapy, presents with a forehead contusion, right periorbital bruising, and left foot pain following a fall two days ago. Her foot pain, which was initially severe, has improved before arrival. She noticed new bruising around her right eye, prompting the visit to urgent care. She denies further headaches, dizziness, blurred vision, eye pain, light sensitivity, paresthesias, nausea, or vomiting. An X-ray of the foot showed no fractures or acute abnormalities. The patient was advised to continue with ice and monitor her symptoms. She can follow up with orthopedics if there is no improvement over the next week. The right eye periorbital bruising is likely due to broken blood vessels collecting beneath the skin, a common reaction to  trauma. There is low suspicion for acute fractures, concussion, or intracranial injury at this time. The patient is alert, oriented, afebrile, and in no acute distress. Vital signs are stable, and no focal deficits were noted on exam. Reassurance was provided to both the patient and her husband. They were advised to apply ice to the affected areas several times a day and to avoid aspirin or NSAIDs. Monitoring parameters and indications for ED evaluation were thoroughly reviewed with the patient and her husband.  Today's evaluation has revealed no signs of a dangerous process. Discussed diagnosis with patient and/or guardian. Patient and/or guardian aware of their diagnosis, possible red flag symptoms to watch out for and need for close follow up. Patient and/or guardian understands verbal and written discharge instructions. Patient and/or guardian comfortable with plan and disposition.  Patient and/or guardian has a clear mental status at this time, good insight into illness (after discussion  and teaching) and has clear judgment to make decisions regarding their care  Documentation was completed with the aid of voice recognition software. Transcription may contain typographical errors. Final Clinical Impressions(s) / UC Diagnoses   Final diagnoses:  Left foot pain  Periorbital hematoma of right eye  Fall, initial encounter  Traumatic hematoma of forehead, initial encounter     Discharge Instructions      You were seen today due to bruising around your right eye & left foot pain after sustaining a fall 2 days ago. An X-ray of your foot has been completed, and the results are currently pending radiologist review. You may check the results on MyChart. In the meantime, continue applying ice and elevating your foot to reduce swelling. If the X-ray does not show any fractures but the pain persists, you may consider following up with orthopedics.  The bruising around your right eye is a common reaction to trauma. It occurs when blood from broken blood vessels collects beneath the skin, causing discoloration. This bruising typically becomes more noticeable within the first 1 to 2 days after the injury as the blood spreads and the swelling increases. I do not suspect any fractures, concussion, or intracranial injury at this time. To help reduce swelling, apply an ice pack wrapped in a cloth to the area for 15 to 20 minutes every couple of hours. Use Tylenol for discomfort, but avoid aspirin or NSAIDs like ibuprofen, as these can increase bleeding. Monitor for any worsening symptoms, such as increased pain, vision problems, eye pain, or confusion. If any of these occur, please seek further medical evaluation.  Go to the ED immediately if:  You have severe pain or a headache that is not relieved by medicine. You have unusual sleepiness, confusion, or personality changes. You vomit. You have a nosebleed that does not stop. You have double vision or blurred vision. You have clear fluid  draining from your nose or ear, and it does not go away. You have trouble walking or using your arms or legs. You have severe dizziness.     ED Prescriptions   None    PDMP not reviewed this encounter.   Lurline Idol, [place] 06/03/23 2028

## 2023-06-03 NOTE — Discharge Instructions (Addendum)
 You were seen today due to bruising around your right eye & left foot pain after sustaining a fall 2 days ago. An X-ray of your foot has been completed, and the results are currently pending radiologist review. You may check the results on MyChart. In the meantime, continue applying ice and elevating your foot to reduce swelling. If the X-ray does not show any fractures but the pain persists, you may consider following up with orthopedics.  The bruising around your right eye is a common reaction to trauma. It occurs when blood from broken blood vessels collects beneath the skin, causing discoloration. This bruising typically becomes more noticeable within the first 1 to 2 days after the injury as the blood spreads and the swelling increases. I do not suspect any fractures, concussion, or intracranial injury at this time. To help reduce swelling, apply an ice pack wrapped in a cloth to the area for 15 to 20 minutes every couple of hours. Use Tylenol for discomfort, but avoid aspirin or NSAIDs like ibuprofen, as these can increase bleeding. Monitor for any worsening symptoms, such as increased pain, vision problems, eye pain, or confusion. If any of these occur, please seek further medical evaluation.  Go to the ED immediately if:  You have severe pain or a headache that is not relieved by medicine. You have unusual sleepiness, confusion, or personality changes. You vomit. You have a nosebleed that does not stop. You have double vision or blurred vision. You have clear fluid draining from your nose or ear, and it does not go away. You have trouble walking or using your arms or legs. You have severe dizziness.

## 2023-06-04 ENCOUNTER — Inpatient Hospital Stay: Payer: 59

## 2023-06-04 ENCOUNTER — Inpatient Hospital Stay: Payer: 59 | Admitting: Adult Health

## 2023-06-04 VITALS — HR 92

## 2023-06-04 VITALS — BP 115/65 | HR 103 | Temp 98.3°F | Resp 17 | Ht 68.0 in | Wt 164.3 lb

## 2023-06-04 DIAGNOSIS — Z5112 Encounter for antineoplastic immunotherapy: Secondary | ICD-10-CM | POA: Diagnosis not present

## 2023-06-04 DIAGNOSIS — Z171 Estrogen receptor negative status [ER-]: Secondary | ICD-10-CM

## 2023-06-04 DIAGNOSIS — C50412 Malignant neoplasm of upper-outer quadrant of left female breast: Secondary | ICD-10-CM

## 2023-06-04 MED ORDER — ACETAMINOPHEN 325 MG PO TABS
650.0000 mg | ORAL_TABLET | Freq: Once | ORAL | Status: AC
  Administered 2023-06-04: 650 mg via ORAL
  Filled 2023-06-04: qty 2

## 2023-06-04 MED ORDER — DIPHENOXYLATE-ATROPINE 2.5-0.025 MG PO TABS: 1.0000 | ORAL_TABLET | Freq: Four times a day (QID) | ORAL | 0 refills | Status: DC | PRN

## 2023-06-04 MED ORDER — SODIUM CHLORIDE 0.9 % IV SOLN: Freq: Once | INTRAVENOUS | Status: AC

## 2023-06-04 MED ORDER — SODIUM CHLORIDE 0.9 % IV SOLN
420.0000 mg | Freq: Once | INTRAVENOUS | Status: AC
  Administered 2023-06-04: 420 mg via INTRAVENOUS
  Filled 2023-06-04: qty 14

## 2023-06-04 MED ORDER — DIPHENHYDRAMINE HCL 25 MG PO CAPS
25.0000 mg | ORAL_CAPSULE | Freq: Once | ORAL | Status: AC
  Administered 2023-06-04: 25 mg via ORAL
  Filled 2023-06-04: qty 1

## 2023-06-04 MED ORDER — DIPHENOXYLATE-ATROPINE 2.5-0.025 MG PO TABS: 1.0000 | ORAL_TABLET | Freq: Four times a day (QID) | ORAL | 0 refills | Status: AC | PRN

## 2023-06-04 MED ORDER — TRASTUZUMAB-ANNS CHEMO 150 MG IV SOLR
6.0000 mg/kg | Freq: Once | INTRAVENOUS | Status: AC
  Administered 2023-06-04: 420 mg via INTRAVENOUS
  Filled 2023-06-04: qty 20

## 2023-06-04 NOTE — Progress Notes (Signed)
 [facility] Cancer Follow up:    Erika Soho, PA-C [address] [place] [address]   DIAGNOSIS:  Cancer Staging  Malignant neoplasm of upper-outer quadrant of left breast in female, estrogen receptor negative (HCC) Staging form: Breast, AJCC 8th Edition - Clinical stage from 05/21/2022: Stage IIB (cT2, cN1, cM0, G3, ER-, PR-, HER2+) - Signed by [name], MD on 05/21/2022 Stage prefix: Initial diagnosis Histologic grading system: 3 grade system   SUMMARY OF ONCOLOGIC HISTORY: Oncology History  Malignant neoplasm of upper-outer quadrant of left breast in female, estrogen receptor negative (HCC)  05/15/2022 Initial Diagnosis   Palpable mass in the left breast at 1 o'clock position measured 4.2 cm ultrasound-guided biopsy revealed grade 3 IDC ER/PR negative HER2 positive Ki-67 35%, left axillary lymph node: Biopsy positive   05/21/2022 Cancer Staging   Staging form: Breast, AJCC 8th Edition - Clinical stage from 05/21/2022: Stage IIB (cT2, cN1, cM0, G3, ER-, PR-, HER2+) - Signed by [name], MD on 05/21/2022 Stage prefix: Initial diagnosis Histologic grading system: 3 grade system   05/29/2022 Genetic Testing   Negative [facility] Multi-Cancer +RNA Panel.  Report date is 05/29/2022.   The Multi-Cancer + RNA Panel offered by [facility] includes sequencing and/or deletion/duplication analysis of the following 70 genes:  AIP*, ALK, APC*, ATM*, AXIN2*, BAP1*, BARD1*, BLM*, BMPR1A*, BRCA1*, BRCA2*, BRIP1*, CDC73*, CDH1*, CDK4, CDKN1B*, CDKN2A, CHEK2*, CTNNA1*, DICER1*, EPCAM (del/dup only), EGFR, FH*, FLCN*, GREM1 (promoter dup only), HOXB13, KIT, LZTR1, MAX*, MBD4, MEN1*, MET, MITF, MLH1*, MSH2*, MSH3*, MSH6*, MUTYH*, NF1*, NF2*, NTHL1*, PALB2*, PDGFRA, PMS2*, POLD1*, POLE*, POT1*, PRKAR1A*, PTCH1*, PTEN*, RAD51C*, RAD51D*, RB1*, RET, SDHA* (sequencing only), SDHAF2*, SDHB*, SDHC*, SDHD*, SMAD4*, SMARCA4*, SMARCB1*, SMARCE1*, STK11*, SUFU*, TMEM127*, TP53*, TSC1*,  TSC2*, VHL*. RNA analysis is performed for * genes.   06/05/2022 - 10/29/2022 Chemotherapy   Patient is on Treatment Plan : BREAST  Docetaxel + Carboplatin + Trastuzumab + Pertuzumab  (TCHP) q21d      11/04/2022 Surgery   Left lumpectomy: No residual invasive cancer identified. 0/4 lymph nodes negative pathologic complete response    11/20/2022 -  Chemotherapy   Patient is on Treatment Plan : BREAST Trastuzumab  + Pertuzumab q21d x 13 cycles     01/27/2023 - 03/13/2023 Radiation Therapy   Adjuvant radiation Plan Name: Breast_L_BH Site: Breast, Left Technique: 3D Mode: Photon Dose Per Fraction: 1.8 Gy Prescribed Dose (Delivered / Prescribed): 50.4 Gy / 50.4 Gy Prescribed Fxs (Delivered / Prescribed): 28 / 28   Plan Name: Brst_L_SCV_BH Site: Sclav-LT Technique: 3D Mode: Photon Dose Per Fraction: 1.8 Gy Prescribed Dose (Delivered / Prescribed): 50.4 Gy / 50.4 Gy Prescribed Fxs (Delivered / Prescribed): 28 / 28   Plan Name: Brst_L_BH_Bst Site: Breast, Left Technique: 3D Mode: Photon Dose Per Fraction: 2 Gy Prescribed Dose (Delivered / Prescribed): 10 Gy / 10 Gy Prescribed Fxs (Delivered / Prescribed): 5 / 5     CURRENT THERAPY: Herceptin/Perjeta  INTERVAL HISTORY:  Discussed the use of AI scribe software for clinical note transcription with the patient, who gave verbal consent to proceed.  Erika [name] 63 y.o. female with a history of breast cancer, presents with multiple concerns. She recently experienced a fall, resulting in a bruised head and a swollen foot. The patient reports hitting her head against a wall and experiencing subsequent neck pain. She also mentions that her foot bore the brunt of her weight during the fall, which may have contributed to the swelling and soreness.  In addition to the  injuries from the fall, the patient has been experiencing persistent diarrhea, which she attributes to her cancer treatment medication, Progetta. She has been managing this  symptom with medication, but it has not completely resolved.  The patient also discusses her ongoing struggle with anxiety, for which she has been prescribed Xanax. She mentions feeling stressed and has been using crafting as a coping mechanism.  Lastly, the patient is on her last treatment for breast cancer and expresses hope for a positive outcome. She mentions a desire to avoid recurrence and to move on from this phase of her life.   Patient Active Problem List   Diagnosis Date Noted   Port-A-Cath in place 06/03/2022   Genetic testing 05/30/2022   Therapeutic drug monitoring 05/27/2022   Malignant neoplasm of upper-outer quadrant of left breast in female, estrogen receptor negative (HCC) 05/19/2022    is allergic to gabapentin (once-daily) and penicillins.  MEDICAL HISTORY: Past Medical History:  Diagnosis Date   Anxiety    Breast cancer (HCC)    Colitis 04/07/2008   seen on CT (at ER visit)   Depression    Family history of adverse reaction to anesthesia    mother with h/o nausea   GERD (gastroesophageal reflux disease)    History of radiation therapy    Left breast-01/27/23-03/13/23- Dr. Antony Blackbird   Hyperplastic colon polyp    Hypertension    Liver hemangioma 04/07/2008   seen on CT   Personal history of chemotherapy    Personal history of radiation therapy     SURGICAL HISTORY: Past Surgical History:  Procedure Laterality Date   "freezing of cervix"     ABDOMINAL HYSTERECTOMY     BREAST BIOPSY Left 05/15/2022   [place] LT BREAST BX W LOC DEV 1ST LESION IMG BX SPEC US GUIDE 05/15/2022 [facility]   BREAST BIOPSY  11/03/2022   MM LT RADIOACTIVE SEED LOC MAMMO GUIDE 11/03/2022 [facility]   BREAST BIOPSY  11/03/2022   MM LT RADIOACTIVE SEED EA ADD LESION LOC MAMMO GUIDE 11/03/2022 [facility]   BREAST BIOPSY Left 11/03/2022   [place] LT RADIOACTIVE SEED LOC 11/03/2022 [facility]   BREAST LUMPECTOMY Left 11/04/2022   BREAST LUMPECTOMY WITH  RADIOACTIVE SEED AND SENTINEL LYMPH NODE BIOPSY Left 11/04/2022   Procedure: LEFT BREAST SEED BRACKETED LUMPECTOMY, LEFT SENTINEL LYMPH NODE BIOPSY;  Surgeon: [name], MD;  Location: [facility];  Service: General;  Laterality: Left;  PEC BLOCK   BREAST RECONSTRUCTION Left 11/14/2022   Procedure: LEFT  ONCOPLASTIC BREAST RECONSTRUCTION;  Surgeon: Glenna Fellows, MD;  Location: [facility];  Service: Plastics;  Laterality: Left;   BREAST REDUCTION SURGERY Right 11/14/2022   Procedure: MAMMARY REDUCTION  (BREAST);  Surgeon: Glenna Fellows, MD;  Location: [facility];  Service: Plastics;  Laterality: Right;   CESAREAN SECTION     NASAL SINUS SURGERY     PORTACATH PLACEMENT N/A 05/29/2022   Procedure: INSERTION PORT-A-CATH;  Surgeon: [name], MD;  Location: [facility];  Service: General;  Laterality: N/A;   RADIOACTIVE SEED GUIDED AXILLARY SENTINEL LYMPH NODE Left 11/04/2022   Procedure: SEED TARGETED LEFT AXILLARY LYMPH NODE EXCISIONAL BIOPSY;  Surgeon: [name], MD;  Location: [facility];  Service: General;  Laterality: Left;   REDUCTION MAMMAPLASTY Right 11/14/2022   right kneecap surgery +      SOCIAL HISTORY: Social History   Socioeconomic History   Marital status: Married    Spouse name: Not on file  Number of children: 0   Years of education: Not on file   Highest education level: Not on file  Occupational History   Occupation: Realestate  Tobacco Use   Smoking status: Never   Smokeless tobacco: Never   Tobacco comments:    smoked 1 year while in college  Vaping Use   Vaping status: Never Used  Substance and Sexual Activity   Alcohol use: Yes    Alcohol/week: 14.0 standard drinks of alcohol    Types: 14 Glasses of wine per week    Comment: wine - daily   Drug use: No   Sexual activity: Not on file  Other Topics Concern   Not on file  Social History Narrative   1 caffeine drinks daily     Social Drivers of Health   Financial Resource Strain: Low Risk  (01/20/2022)   Received from [facility], [facility]   Overall Financial Resource Strain (CARDIA)    Difficulty of Paying Living Expenses: Not hard at all  Food Insecurity: No Food Insecurity (01/13/2023)   Hunger Vital Sign    Worried About Running Out of Food in the Last Year: Never true    Ran Out of Food in the Last Year: Never true  Transportation Needs: Not on file  Physical Activity: Insufficiently Active (01/20/2022)   Received from [facility]   Exercise Vital Sign    Days of Exercise per Week: 2 days    Minutes of Exercise per Session: 20 min  Stress: Stress Concern Present (01/20/2022)   Received from [facility], [facility] - Occupational Stress Questionnaire    Feeling of Stress : To some extent  Social Connections: Socially Integrated (01/20/2022)   Received from [facility]   Social Network    How would you rate your social network (family, work, friends)?: Good participation with social networks  Intimate Partner Violence: Not At Risk (01/13/2023)   Humiliation, Afraid, Rape, and Kick questionnaire    Fear of Current or Ex-Partner: No    Emotionally Abused: No    Physically Abused: No    Sexually Abused: No    FAMILY HISTORY: Family History  Problem Relation Age of Onset   Kidney cancer Mother        dx after 17; early stage   Hypertension Father    Heart disease Father    Colon polyps Father    Lung cancer Father 67       smoking hx   Colon polyps Sister    Hypertension Brother    Cancer Maternal Uncle        ? blood cancer; dx after 50   Cancer Maternal Grandmother        ? gallbladder ? colon; d. after 60   Diabetes Paternal Grandmother     Review of Systems  Constitutional:  Negative for appetite change, chills, fatigue, fever and unexpected weight change.  HENT:   Negative for hearing loss,  lump/mass and trouble swallowing.   Eyes:  Negative for eye problems and icterus.  Respiratory:  Negative for chest tightness, cough and shortness of breath.   Cardiovascular:  Negative for chest pain, leg swelling and palpitations.  Gastrointestinal:  Negative for abdominal distention, abdominal pain, constipation, diarrhea, nausea and vomiting.  Endocrine: Negative for hot flashes.  Genitourinary:  Negative for difficulty urinating.   Musculoskeletal:  Negative for arthralgias.  Skin:  Negative for itching and rash.  Neurological:  Negative  for dizziness, extremity weakness, headaches and numbness.  Hematological:  Negative for adenopathy. Does not bruise/bleed easily.  Psychiatric/Behavioral:  Negative for depression. The patient is nervous/anxious.       PHYSICAL EXAMINATION    Vitals:   06/04/23 1407  BP: 115/65  Pulse: (!) 103  Resp: 17  Temp: 98.3 F (36.8 C)  SpO2: 99%    Physical Exam Constitutional:      General: She is not in acute distress.    Appearance: Normal appearance. She is not toxic-appearing.  HENT:     Head: Normocephalic.     Comments: Right eye ecchymosis from recent fall    Mouth/Throat:     Mouth: Mucous membranes are moist.     Pharynx: Oropharynx is clear. No oropharyngeal exudate or posterior oropharyngeal erythema.  Eyes:     General: No scleral icterus. Cardiovascular:     Rate and Rhythm: Normal rate and regular rhythm.     Pulses: Normal pulses.     Heart sounds: Normal heart sounds.  Pulmonary:     Effort: Pulmonary effort is normal.     Breath sounds: Normal breath sounds.  Abdominal:     General: Abdomen is flat. Bowel sounds are normal. There is no distension.     Palpations: Abdomen is soft.     Tenderness: There is no abdominal tenderness.  Musculoskeletal:        General: No swelling.     Cervical back: Neck supple.  Lymphadenopathy:     Cervical: No cervical adenopathy.  Skin:    General: Skin is warm and dry.      Findings: No rash.  Neurological:     General: No focal deficit present.     Mental Status: She is alert.  Psychiatric:        Mood and Affect: Mood normal.        Behavior: Behavior normal.     ASSESSMENT and THERAPY PLAN:   Malignant neoplasm of upper-outer quadrant of left breast in female, 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   Treatment plan: 1. Neoadjuvant chemotherapy with TCH Perjeta 6 cycles completed 09/18/2022 (Taxotere switched with Abraxane) followed by Herceptin Perjeta maintenance versus Kadcyla maintenance (based on response to neoadjuvant chemo) through 05/2023 2. 11/04/2022: Left lumpectomy: No residual invasive cancer identified.  0/4 lymph nodes negative pathologic complete response 3. Followed by adjuvant radiation therapy started 01/28/2023-03/13/2023 ------------------------------------------------------------------------ Current treatment: Herceptin Perjeta maintenance to complete 06/04/2023 Plan to initiate guardant reveal MRD monitoring  Fall with Head Trauma Recent fall with head trauma resulting in bruising but no significant headaches or other neurological symptoms. -Continue monitoring for any new or worsening symptoms.  Foot Injury Recent foot injury with swelling and pain, no fracture identified on X-ray. -Continue monitoring and manage pain as needed.  HER2 Positive Breast Cancer Last treatment of Herceptin and Perjeta today. Complete response to treatment noted. Persistent diarrhea likely secondary to Perjeta. -Expect diarrhea to resolve after discontinuation of Perjeta. -Lomotil PRN refilled -If diarrhea persists, consider dietary modifications and re-evaluation.  Anxiety Reports benefit from Xanax 0.5mg  PRN and engaging in knitting for anxiety management. -Continue  current management strategies.  General Health Maintenance / Followup Plans -Survivorship care plan visit scheduled for June. -After treatment summary to be prepared and made accessible to all care team members. -Continue monitoring for late term side effects of treatment.  All questions were answered. The patient knows to call the clinic with any problems, questions or concerns. We can certainly see the patient much sooner if necessary.  Total encounter time:30 minutes*in face-to-face visit time, chart review, lab review, care coordination, order entry, and documentation of the encounter time.  Lillard Anes, NP 06/04/23 3:38 PM Medical Oncology and Hematology [facility] [address] [place], [place] [address] Tel. [phone]    Fax. [phone]  *Total Encounter Time as defined by the Centers for Medicare and Medicaid Services includes, in addition to the face-to-face time of a patient visit (documented in the note above) non-face-to-face time: obtaining and reviewing outside history, ordering and reviewing medications, tests or procedures, care coordination (communications with other health care professionals or caregivers) and documentation in the medical record.

## 2023-06-04 NOTE — Assessment & Plan Note (Signed)
 stage IIB ER/PR negative, HER2 positive breast cancer here today for evaluation and follow-up prior to receiving cycle 6 of neoadjuvant Abraxane, carboplatin, Herceptin, and Perjeta.   Treatment plan: 1. Neoadjuvant chemotherapy with TCH Perjeta 6 cycles completed 09/18/2022 (Taxotere switched with Abraxane) followed by Herceptin Perjeta maintenance versus Kadcyla maintenance (based on response to neoadjuvant chemo) through 05/2023 2. 11/04/2022: Left lumpectomy: No residual invasive cancer identified.  0/4 lymph nodes negative pathologic complete response 3. Followed by adjuvant radiation therapy started 01/28/2023-03/13/2023 ------------------------------------------------------------------------ Current treatment: Herceptin Perjeta maintenance to complete 06/04/2023 Plan to initiate guardant reveal MRD monitoring  Fall with Head Trauma Recent fall with head trauma resulting in bruising but no significant headaches or other neurological symptoms. -Continue monitoring for any new or worsening symptoms.  Foot Injury Recent foot injury with swelling and pain, no fracture identified on X-ray. -Continue monitoring and manage pain as needed.  HER2 Positive Breast Cancer Last treatment of Herceptin and Perjeta today. Complete response to treatment noted. Persistent diarrhea likely secondary to Perjeta. -Expect diarrhea to resolve after discontinuation of Perjeta. -Lomotil PRN refilled -If diarrhea persists, consider dietary modifications and re-evaluation.  Anxiety Reports benefit from Xanax 0.5mg  PRN and engaging in knitting for anxiety management. -Continue current management strategies.  General Health Maintenance / Followup Plans -Survivorship care plan visit scheduled for June. -After treatment summary to be prepared and made accessible to all care team members. -Continue monitoring for late term side effects of treatment.

## 2023-06-04 NOTE — Patient Instructions (Signed)
 [facility] - A DEPT OF [facility]  Discharge Instructions: Thank you for choosing [facility] to provide your oncology and hematology care.   If you have a lab appointment with the Cancer Center, please go directly to the Cancer Center and check in at the registration area.   Wear comfortable clothing and clothing appropriate for easy access to any Portacath or PICC line.   We strive to give you quality time with your provider. You may need to reschedule your appointment if you arrive late (15 or more minutes).  Arriving late affects you and other patients whose appointments are after yours.  Also, if you miss three or more appointments without notifying the office, you may be dismissed from the clinic at the provider's discretion.      For prescription refill requests, have your pharmacy contact our office and allow 72 hours for refills to be completed.    Today you received the following chemotherapy and/or immunotherapy agents Kanjinti and Perjeta.       To help prevent nausea and vomiting after your treatment, we encourage you to take your nausea medication as directed.  BELOW ARE SYMPTOMS THAT SHOULD BE REPORTED IMMEDIATELY: *FEVER GREATER THAN 100.4 F (38 C) OR HIGHER *CHILLS OR SWEATING *NAUSEA AND VOMITING THAT IS NOT CONTROLLED WITH YOUR NAUSEA MEDICATION *UNUSUAL SHORTNESS OF BREATH *UNUSUAL BRUISING OR BLEEDING *URINARY PROBLEMS (pain or burning when urinating, or frequent urination) *BOWEL PROBLEMS (unusual diarrhea, constipation, pain near the anus) TENDERNESS IN MOUTH AND THROAT WITH OR WITHOUT PRESENCE OF ULCERS (sore throat, sores in mouth, or a toothache) UNUSUAL RASH, SWELLING OR PAIN  UNUSUAL VAGINAL DISCHARGE OR ITCHING   Items with * indicate a potential emergency and should be followed up as soon as possible or go to the Emergency Department if any problems should occur.  Please show the CHEMOTHERAPY ALERT CARD or  IMMUNOTHERAPY ALERT CARD at check-in to the Emergency Department and triage nurse.  Should you have questions after your visit or need to cancel or reschedule your appointment, please contact [facility] - A DEPT OF [facility]  Dept: [phone]  and follow the prompts.  Office hours are 8:00 a.m. to 4:30 p.m. Monday - Friday. Please note that voicemails left after 4:00 p.m. may not be returned until the following business day.  We are closed weekends and major holidays. You have access to a nurse at all times for urgent questions. Please call the main number to the clinic Dept: [phone] and follow the prompts.   For any non-urgent questions, you may also contact your provider using MyChart. We now offer e-Visits for anyone 32 and older to request care online for non-urgent symptoms. For details visit [email or web address].   Also download the MyChart app! Go to the app store, search "MyChart", open the app, select [place], and log in with your MyChart username and password.

## 2023-06-11 ENCOUNTER — Encounter (HOSPITAL_BASED_OUTPATIENT_CLINIC_OR_DEPARTMENT_OTHER): Payer: Self-pay | Admitting: General Surgery

## 2023-06-16 ENCOUNTER — Encounter (HOSPITAL_BASED_OUTPATIENT_CLINIC_OR_DEPARTMENT_OTHER)
Admission: RE | Admit: 2023-06-16 | Discharge: 2023-06-16 | Disposition: A | Discharge status: Home / Self Care | Source: Ambulatory Visit | Attending: General Surgery | Admitting: General Surgery

## 2023-06-16 DIAGNOSIS — Z01818 Encounter for other preprocedural examination: Secondary | ICD-10-CM | POA: Insufficient documentation

## 2023-06-16 MED ORDER — CHLORHEXIDINE GLUCONATE CLOTH 2 % EX PADS: 6.0000 | MEDICATED_PAD | Freq: Once | CUTANEOUS | Status: DC

## 2023-06-16 MED ORDER — CHLORHEXIDINE GLUCONATE CLOTH 2 % EX PADS
6.0000 | MEDICATED_PAD | Freq: Once | CUTANEOUS | Status: DC
Start: 2023-06-16 — End: 2023-06-18

## 2023-06-16 NOTE — Progress Notes (Signed)

## 2023-06-18 ENCOUNTER — Ambulatory Visit (HOSPITAL_BASED_OUTPATIENT_CLINIC_OR_DEPARTMENT_OTHER)
Admission: RE | Admit: 2023-06-18 | Discharge: 2023-06-18 | Disposition: A | Discharge status: Home / Self Care | Payer: 59 | Attending: General Surgery | Admitting: General Surgery

## 2023-06-18 ENCOUNTER — Encounter (HOSPITAL_BASED_OUTPATIENT_CLINIC_OR_DEPARTMENT_OTHER): Payer: Self-pay | Admitting: General Surgery

## 2023-06-18 ENCOUNTER — Other Ambulatory Visit: Payer: Self-pay

## 2023-06-18 ENCOUNTER — Ambulatory Visit (HOSPITAL_BASED_OUTPATIENT_CLINIC_OR_DEPARTMENT_OTHER): Admitting: Anesthesiology

## 2023-06-18 ENCOUNTER — Encounter (HOSPITAL_BASED_OUTPATIENT_CLINIC_OR_DEPARTMENT_OTHER)
Admission: RE | Disposition: A | Discharge status: Home / Self Care | Payer: Self-pay | Source: Home / Self Care | Attending: General Surgery

## 2023-06-18 DIAGNOSIS — Z853 Personal history of malignant neoplasm of breast: Secondary | ICD-10-CM | POA: Insufficient documentation

## 2023-06-18 DIAGNOSIS — Z9221 Personal history of antineoplastic chemotherapy: Secondary | ICD-10-CM | POA: Insufficient documentation

## 2023-06-18 DIAGNOSIS — Z452 Encounter for adjustment and management of vascular access device: Secondary | ICD-10-CM | POA: Insufficient documentation

## 2023-06-18 DIAGNOSIS — F32A Depression, unspecified: Secondary | ICD-10-CM | POA: Insufficient documentation

## 2023-06-18 DIAGNOSIS — K219 Gastro-esophageal reflux disease without esophagitis: Secondary | ICD-10-CM | POA: Insufficient documentation

## 2023-06-18 DIAGNOSIS — C50912 Malignant neoplasm of unspecified site of left female breast: Secondary | ICD-10-CM

## 2023-06-18 DIAGNOSIS — Z01818 Encounter for other preprocedural examination: Secondary | ICD-10-CM

## 2023-06-18 DIAGNOSIS — F419 Anxiety disorder, unspecified: Secondary | ICD-10-CM | POA: Diagnosis not present

## 2023-06-18 DIAGNOSIS — Z95828 Presence of other vascular implants and grafts: Secondary | ICD-10-CM

## 2023-06-18 HISTORY — PX: PORT-A-CATH REMOVAL: SHX5289

## 2023-06-18 SURGERY — REMOVAL PORT-A-CATH
Anesthesia: Monitor Anesthesia Care | Site: Chest

## 2023-06-18 MED ORDER — MIDAZOLAM HCL 2 MG/2ML IJ SOLN
INTRAMUSCULAR | Status: AC
  Filled 2023-06-18: qty 2

## 2023-06-18 MED ORDER — PHENYLEPHRINE HCL (PRESSORS) 10 MG/ML IV SOLN
INTRAVENOUS | Status: DC | PRN
  Administered 2023-06-18: 80 ug via INTRAVENOUS

## 2023-06-18 MED ORDER — OXYCODONE HCL 5 MG/5ML PO SOLN: 5.0000 mg | Freq: Once | ORAL | Status: DC | PRN

## 2023-06-18 MED ORDER — MIDAZOLAM HCL 5 MG/5ML IJ SOLN
INTRAMUSCULAR | Status: DC | PRN
  Administered 2023-06-18: 2 mg via INTRAVENOUS

## 2023-06-18 MED ORDER — FENTANYL CITRATE (PF) 100 MCG/2ML IJ SOLN
INTRAMUSCULAR | Status: AC
Start: 2023-06-18 — End: ?
  Filled 2023-06-18: qty 2

## 2023-06-18 MED ORDER — LACTATED RINGERS IV SOLN: INTRAVENOUS | Status: DC

## 2023-06-18 MED ORDER — CIPROFLOXACIN IN D5W 400 MG/200ML IV SOLN
INTRAVENOUS | Status: AC
  Filled 2023-06-18: qty 200

## 2023-06-18 MED ORDER — ONDANSETRON HCL 4 MG/2ML IJ SOLN
INTRAMUSCULAR | Status: DC | PRN
  Administered 2023-06-18: 4 mg via INTRAVENOUS

## 2023-06-18 MED ORDER — FENTANYL CITRATE (PF) 100 MCG/2ML IJ SOLN: 25.0000 ug | INTRAMUSCULAR | Status: DC | PRN

## 2023-06-18 MED ORDER — OXYCODONE HCL 5 MG PO TABS: 5.0000 mg | ORAL_TABLET | Freq: Once | ORAL | Status: DC | PRN

## 2023-06-18 MED ORDER — FENTANYL CITRATE (PF) 100 MCG/2ML IJ SOLN
INTRAMUSCULAR | Status: DC | PRN
  Administered 2023-06-18: 50 ug via INTRAVENOUS

## 2023-06-18 MED ORDER — CIPROFLOXACIN IN D5W 400 MG/200ML IV SOLN
400.0000 mg | INTRAVENOUS | Status: AC
  Administered 2023-06-18: 400 mg via INTRAVENOUS

## 2023-06-18 MED ORDER — PROPOFOL 10 MG/ML IV BOLUS
INTRAVENOUS | Status: DC | PRN
  Administered 2023-06-18 (×2): 20 mg via INTRAVENOUS

## 2023-06-18 MED ORDER — LIDOCAINE HCL (CARDIAC) PF 100 MG/5ML IV SOSY
PREFILLED_SYRINGE | INTRAVENOUS | Status: DC | PRN
  Administered 2023-06-18: 40 mg via INTRAVENOUS

## 2023-06-18 MED ORDER — PROPOFOL 500 MG/50ML IV EMUL
INTRAVENOUS | Status: DC | PRN
  Administered 2023-06-18: 100 ug/kg/min via INTRAVENOUS

## 2023-06-18 MED ORDER — BUPIVACAINE-EPINEPHRINE (PF) 0.25% -1:200000 IJ SOLN
INTRAMUSCULAR | Status: DC | PRN
  Administered 2023-06-18: 7.5 mL

## 2023-06-18 MED ORDER — ONDANSETRON HCL 4 MG/2ML IJ SOLN
INTRAMUSCULAR | Status: AC
  Filled 2023-06-18: qty 2

## 2023-06-18 MED ORDER — DROPERIDOL 2.5 MG/ML IJ SOLN: 0.6250 mg | Freq: Once | INTRAMUSCULAR | Status: DC | PRN

## 2023-06-18 MED ORDER — LIDOCAINE HCL (PF) 1 % IJ SOLN
INTRAMUSCULAR | Status: DC | PRN
  Administered 2023-06-18: 7.5 mL

## 2023-06-18 MED ORDER — ACETAMINOPHEN 500 MG PO TABS
ORAL_TABLET | ORAL | Status: AC
  Filled 2023-06-18: qty 2

## 2023-06-18 MED ORDER — ACETAMINOPHEN 500 MG PO TABS
1000.0000 mg | ORAL_TABLET | ORAL | Status: AC
  Administered 2023-06-18: 1000 mg via ORAL

## 2023-06-18 SURGICAL SUPPLY — 30 items
BLADE HEX COATED 2.75 (ELECTRODE) ×2 IMPLANT
BLADE SURG 15 STRL LF DISP TIS (BLADE) ×2 IMPLANT
CANISTER SUCT 1200ML W/VALVE (MISCELLANEOUS) IMPLANT
CHLORAPREP W/TINT 26 (MISCELLANEOUS) ×2 IMPLANT
COVER BACK TABLE 60X90IN (DRAPES) ×2 IMPLANT
COVER MAYO STAND STRL (DRAPES) ×2 IMPLANT
DERMABOND ADVANCED .7 DNX12 (GAUZE/BANDAGES/DRESSINGS) ×2 IMPLANT
DRAPE LAPAROTOMY 100X72 PEDS (DRAPES) ×2 IMPLANT
DRAPE UTILITY XL STRL (DRAPES) ×2 IMPLANT
ELECT REM PT RETURN 9FT ADLT (ELECTROSURGICAL) ×1 IMPLANT
ELECTRODE REM PT RTRN 9FT ADLT (ELECTROSURGICAL) ×2 IMPLANT
GLOVE BIO SURGEON STRL SZ 6 (GLOVE) ×2 IMPLANT
GLOVE BIO SURGEON STRL SZ7 (GLOVE) IMPLANT
GLOVE BIOGEL PI IND STRL 6.5 (GLOVE) ×2 IMPLANT
GLOVE BIOGEL PI IND STRL 7.0 (GLOVE) IMPLANT
GLOVE BIOGEL PI IND STRL 7.5 (GLOVE) IMPLANT
GOWN STRL REUS W/ TWL LRG LVL3 (GOWN DISPOSABLE) ×2 IMPLANT
GOWN STRL REUS W/ TWL XL LVL3 (GOWN DISPOSABLE) ×2 IMPLANT
NDL HYPO 25X1 1.5 SAFETY (NEEDLE) ×2 IMPLANT
NEEDLE HYPO 25X1 1.5 SAFETY (NEEDLE) ×1 IMPLANT
NS IRRIG 1000ML POUR BTL (IV SOLUTION) IMPLANT
PACK BASIN DAY SURGERY FS (CUSTOM PROCEDURE TRAY) ×2 IMPLANT
PENCIL SMOKE EVACUATOR (MISCELLANEOUS) ×2 IMPLANT
SPIKE FLUID TRANSFER (MISCELLANEOUS) IMPLANT
SUT MNCRL AB 4-0 PS2 18 (SUTURE) ×2 IMPLANT
SUT VIC AB 3-0 SH 27X BRD (SUTURE) ×2 IMPLANT
SYR CONTROL 10ML LL (SYRINGE) ×2 IMPLANT
TOWEL GREEN STERILE FF (TOWEL DISPOSABLE) ×2 IMPLANT
TUBE CONNECTING 20X1/4 (TUBING) IMPLANT
YANKAUER SUCT BULB TIP NO VENT (SUCTIONS) IMPLANT

## 2023-06-18 NOTE — Discharge Instructions (Addendum)
 [facility] Office Phone Number [phone]   POST OP INSTRUCTIONS  Always review your discharge instruction sheet given to you by the facility where your surgery was performed.  IF YOU HAVE DISABILITY OR FAMILY LEAVE FORMS, YOU MUST BRING THEM TO THE OFFICE FOR PROCESSING.  DO NOT GIVE THEM TO YOUR DOCTOR.  Take 2 tylenol (acetominophen) three times a day for 3 days.  If you still have pain, add ibuprofen with food in between if able to take this (if you have kidney issues or stomach issues, do not take ibuprofen).  If both of those are not enough, add the narcotic pain pill.  If you find you are needing a lot of this overnight after surgery, call the next morning for a refill.   Take your usually prescribed medications unless otherwise directed If you need a refill on your pain medication, please contact your pharmacy.  They will contact our office to request authorization.  Prescriptions will not be filled after 5pm or on week-ends. You should eat very light the first 24 hours after surgery, such as soup, crackers, pudding, etc.  Resume your normal diet the day after surgery It is common to experience some constipation if taking pain medication after surgery.  Increasing fluid intake and taking a stool softener will usually help or prevent this problem from occurring.  A mild laxative (Milk of Magnesia or Miralax) should be taken according to package directions if there are no bowel movements after 48 hours. You may shower in 48 hours.  The surgical glue will flake off in 2-3 weeks.   ACTIVITIES:  No strenuous activity or heavy lifting for 1 week.   You may drive when you no longer are taking prescription pain medication, you can comfortably wear a seatbelt, and you can safely maneuver your car and apply brakes. RETURN TO WORK:  __________as tolerated as long as no lifting for 1-2 weeks. _______________ Erika [name] should see your doctor in the office for a follow-up appointment  approximately three-four weeks after your surgery.    WHEN TO CALL YOUR DOCTOR: Fever over 101.0 Nausea and/or vomiting. Extreme swelling or bruising. Continued bleeding from incision. Increased pain, redness, or drainage from the incision.  The clinic staff is available to answer your questions during regular business hours.  Please don't hesitate to call and ask to speak to one of the nurses for clinical concerns.  If you have a medical emergency, go to the nearest emergency room or call 911.  A surgeon from [facility] is always on call at the hospital.  For further questions, please visit [email or web address]     Post Anesthesia Home Care Instructions  Activity: Get plenty of rest for the remainder of the day. A responsible individual must stay with you for 24 hours following the procedure.  For the next 24 hours, DO NOT: -Drive a car -Advertising copywriter -Drink alcoholic beverages -Take any medication unless instructed by your physician -Make any legal decisions or sign important papers.  Meals: Start with liquid foods such as gelatin or soup. Progress to regular foods as tolerated. Avoid greasy, spicy, heavy foods. If nausea and/or vomiting occur, drink only clear liquids until the nausea and/or vomiting subsides. Call your physician if vomiting continues.  Special Instructions/Symptoms: Your throat may feel dry or sore from the anesthesia or the breathing tube placed in your throat during surgery. If this causes discomfort, gargle with warm salt water. The discomfort should disappear within 24 hours.  If you had  a scopolamine patch placed behind your ear for the management of post- operative nausea and/or vomiting:  1. The medication in the patch is effective for 72 hours, after which it should be removed.  Wrap patch in a tissue and discard in the trash. Wash hands thoroughly with soap and water. 2. You may remove the patch earlier than 72 hours if you  experience unpleasant side effects which may include dry mouth, dizziness or visual disturbances. 3. Avoid touching the patch. Wash your hands with soap and water after contact with the patch.     Next dose of Tylenol 1:40pm if needed.

## 2023-06-18 NOTE — Anesthesia Postprocedure Evaluation (Signed)
 Anesthesia Post Note  Patient: Erika [name]  Procedure(s) Performed: PORT REMOVAL (Chest)     Patient location during evaluation: PACU Anesthesia Type: MAC Level of consciousness: awake and alert Pain management: pain level controlled Vital Signs Assessment: post-procedure vital signs reviewed and stable Respiratory status: spontaneous breathing, nonlabored ventilation and respiratory function stable Cardiovascular status: blood pressure returned to baseline Postop Assessment: no apparent nausea or vomiting Anesthetic complications: no   No notable events documented.  Last Vitals:  Vitals:   06/18/23 1000 06/18/23 1014  BP: 106/69 115/79  Pulse: 66 66  Resp: 14 16  Temp:  (!) 36.3 C  SpO2: 97% 97%    Last Pain:  Vitals:   06/18/23 1014  TempSrc:   PainSc: 0-No pain                 Shanda Howells

## 2023-06-18 NOTE — Anesthesia Preprocedure Evaluation (Signed)
 Anesthesia Evaluation  Patient identified by MRN, date of birth, ID band Patient awake    Reviewed: Allergy & Precautions, NPO status , Patient's Chart, lab work & pertinent test results  History of Anesthesia Complications Negative for: history of anesthetic complications  Airway Mallampati: II  TM Distance: >3 FB Neck ROM: Full    Dental no notable dental hx.    Pulmonary neg pulmonary ROS   Pulmonary exam normal        Cardiovascular hypertension, Pt. on medications Normal cardiovascular exam     Neuro/Psych   Anxiety Depression    negative neurological ROS     GI/Hepatic Neg liver ROS,GERD  Medicated,,  Endo/Other  negative endocrine ROS    Renal/GU negative Renal ROS  negative genitourinary   Musculoskeletal negative musculoskeletal ROS (+)    Abdominal   Peds  Hematology negative hematology ROS (+)   Anesthesia Other Findings Breast ca  Reproductive/Obstetrics negative OB ROS                             Anesthesia Physical Anesthesia Plan  ASA: 2  Anesthesia Plan: MAC   Post-op Pain Management: Tylenol PO (pre-op)*   Induction:   PONV Risk Score and Plan: 2 and Treatment may vary due to age or medical condition, Ondansetron, Midazolam and Propofol infusion  Airway Management Planned: Natural Airway and Simple Face Mask  Additional Equipment: None  Intra-op Plan:   Post-operative Plan:   Informed Consent: I have reviewed the patients History and Physical, chart, labs and discussed the procedure including the risks, benefits and alternatives for the proposed anesthesia with the patient or authorized representative who has indicated his/her understanding and acceptance.     Dental advisory given  Plan Discussed with: CRNA  Anesthesia Plan Comments:         Anesthesia Quick Evaluation

## 2023-06-18 NOTE — Op Note (Signed)
  PRE-OPERATIVE DIAGNOSIS:  un-needed Port-A-Cath for left breast cancer  POST-OPERATIVE DIAGNOSIS:  Same   PROCEDURE:  Procedure(s):  REMOVAL PORT-A-CATH  SURGEON:  Surgeon(s):  [name], MD  ANESTHESIA:   MAC + local  EBL:   Minimal  SPECIMEN:  None  Complications : none known  Procedure:   Pt was  identified in the holding area and taken to the operating room where she was placed supine on the operating room table.  MAC anesthesia was induced.  The left upper chest was prepped and draped.  The prior incision was anesthetized with local anesthetic.  The incision was opened with a #15 blade.  The subcutaneous tissue was divided with the cautery.  The port was identified and the capsule opened.  The four 2-0 prolene sutures were removed.  The port was then removed and pressure held on the tract.  The catheter appeared intact without evidence of breakage, length was 25 cm.  The wound was inspected for hemostasis, which was achieved with cautery.  The wound was closed with 3-0 vicryl deep dermal interrupted sutures and 4-0 Monocryl running subcuticular suture.  The wound was cleaned, dried, and dressed with dermabond.  The patient was awakened from anesthesia and taken to the PACU in stable condition.  Needle, sponge, and instrument counts are correct.

## 2023-06-18 NOTE — H&P (Signed)
 Erika [name] is an 63 y.o. female.   Chief Complaint: port in place HPI:  Patient has undergone multidiscliplinary care for left breast cancer.  She has completed chemo and desires port removal.   Past Medical History:  Diagnosis Date   Anxiety    Breast cancer (HCC)    Colitis 04/07/2008   seen on CT (at ER visit)   Depression    Family history of adverse reaction to anesthesia    mother with h/o nausea   GERD (gastroesophageal reflux disease)    History of radiation therapy    Left breast-01/27/23-03/13/23- Dr. Antony Blackbird   Hyperplastic colon polyp    Hypertension    Liver hemangioma 04/07/2008   seen on CT   Personal history of chemotherapy    Personal history of radiation therapy     Past Surgical History:  Procedure Laterality Date   "freezing of cervix"     ABDOMINAL HYSTERECTOMY     BREAST BIOPSY Left 05/15/2022   [place] LT BREAST BX W LOC DEV 1ST LESION IMG BX SPEC US GUIDE 05/15/2022 [facility]   BREAST BIOPSY  11/03/2022   MM LT RADIOACTIVE SEED LOC MAMMO GUIDE 11/03/2022 [facility]   BREAST BIOPSY  11/03/2022   MM LT RADIOACTIVE SEED EA ADD LESION LOC MAMMO GUIDE 11/03/2022 [facility]   BREAST BIOPSY Left 11/03/2022   [place] LT RADIOACTIVE SEED LOC 11/03/2022 [facility]   BREAST LUMPECTOMY Left 11/04/2022   BREAST LUMPECTOMY WITH RADIOACTIVE SEED AND SENTINEL LYMPH NODE BIOPSY Left 11/04/2022   Procedure: LEFT BREAST SEED BRACKETED LUMPECTOMY, LEFT SENTINEL LYMPH NODE BIOPSY;  Surgeon: [name], MD;  Location: [facility];  Service: General;  Laterality: Left;  PEC BLOCK   BREAST RECONSTRUCTION Left 11/14/2022   Procedure: LEFT  ONCOPLASTIC BREAST RECONSTRUCTION;  Surgeon: Glenna Fellows, MD;  Location: [facility];  Service: Plastics;  Laterality: Left;   BREAST REDUCTION SURGERY Right 11/14/2022   Procedure: MAMMARY REDUCTION  (BREAST);  Surgeon: Glenna Fellows, MD;  Location: [facility];  Service: Plastics;  Laterality: Right;   CESAREAN SECTION     NASAL SINUS SURGERY     PORTACATH PLACEMENT N/A 05/29/2022   Procedure: INSERTION PORT-A-CATH;  Surgeon: [name], MD;  Location: [facility];  Service: General;  Laterality: N/A;   RADIOACTIVE SEED GUIDED AXILLARY SENTINEL LYMPH NODE Left 11/04/2022   Procedure: SEED TARGETED LEFT AXILLARY LYMPH NODE EXCISIONAL BIOPSY;  Surgeon: [name], MD;  Location:  SURGERY CENTER;  Service: General;  Laterality: Left;   REDUCTION MAMMAPLASTY Right 11/14/2022   right kneecap surgery +      Family History  Problem Relation Age of Onset   Kidney cancer Mother        dx after 4; early stage   Hypertension Father    Heart disease Father    Colon polyps Father    Lung cancer Father 58       smoking hx   Colon polyps Sister    Hypertension Brother    Cancer Maternal Uncle        ? blood cancer; dx after 50   Cancer Maternal Grandmother        ? gallbladder ? colon; d. after 6   Diabetes Paternal Grandmother    Social History:  reports that she has never smoked. She has never used smokeless tobacco. She reports current alcohol use of about 14.0 standard drinks of alcohol per week. She reports that she  does not use drugs.  Allergies:  Allergies  Allergen Reactions   Gabapentin (Once-Daily) Hives   Penicillins Nausea Only    Medications Prior to Admission  Medication Sig Dispense Refill   aspirin EC 81 MG tablet Take 81 mg by mouth daily.     cetirizine (ZYRTEC) 5 MG tablet Take 5 mg by mouth daily.     lisinopril (ZESTRIL) 20 MG tablet Take 20 mg by mouth daily.     rosuvastatin (CRESTOR) 20 MG tablet Take 20 mg by mouth daily.     sertraline (ZOLOFT) 100 MG tablet Take 100 mg by mouth daily.     ALPRAZolam (XANAX) 0.5 MG tablet 1 TABLET ORALLY TWICE DAILY AS NEEDED 30 DAYS (Patient not taking: Reported on 04/23/2023)     diphenoxylate-atropine (LOMOTIL) 2.5-0.025 MG tablet Take 1 tablet by mouth 4 (four)  times daily as needed for diarrhea or loose stools. 30 tablet 0   omeprazole (PRILOSEC) 20 MG capsule Take by mouth.      No results found for this or any previous visit (from the past 48 hours). No results found.  Review of Systems  All other systems reviewed and are negative.   Blood pressure 118/81, pulse 73, temperature (!) 97.2 F (36.2 C), temperature source Temporal, resp. rate 20, height 5\' 8"  (1.727 m), weight 73.7 kg, SpO2 96%. Physical Exam Vitals and nursing note reviewed.  Constitutional:      General: She is not in acute distress.    Appearance: Normal appearance. She is normal weight. She is not ill-appearing.  HENT:     Head: Normocephalic and atraumatic.  Eyes:     General: No scleral icterus.    Pupils: Pupils are equal, round, and reactive to light.  Cardiovascular:     Rate and Rhythm: Normal rate.  Pulmonary:     Effort: Pulmonary effort is normal. No respiratory distress.     Comments: Left port in place.  Abdominal:     General: Abdomen is flat.  Musculoskeletal:     Cervical back: Neck supple.  Skin:    General: Skin is warm and dry.     Capillary Refill: Capillary refill takes 2 to 3 seconds.  Neurological:     General: No focal deficit present.     Mental Status: She is alert and oriented to person, place, and time.  Psychiatric:        Mood and Affect: Mood normal.        Behavior: Behavior normal.        Thought Content: Thought content normal.        Judgment: Judgment normal.      Assessment/Plan Left breast cancer Port in place.   Plan port removal  Reviewed procedure and risks.  Patient wishes to proceed.    [name], MD 06/18/2023, 8:35 AM

## 2023-06-18 NOTE — Transfer of Care (Signed)
 Immediate Anesthesia Transfer of Care Note  Patient: Erika [name]  Procedure(s) Performed: PORT REMOVAL (Chest)  Patient Location: PACU  Anesthesia Type:MAC  Level of Consciousness: awake, alert , and oriented  Airway & Oxygen Therapy: Patient Spontanous Breathing  Post-op Assessment: Report given to RN and Post -op Vital signs reviewed and stable  Post vital signs: Reviewed and stable  Last Vitals:  Vitals Value Taken Time  BP    Temp    Pulse 72 06/18/23 0939  Resp 14 06/18/23 0939  SpO2 98 % 06/18/23 0939  Vitals shown include unfiled device data.  Last Pain:  Vitals:   06/18/23 0739  TempSrc:   PainSc: 0-No pain         Complications: No notable events documented.

## 2023-06-19 ENCOUNTER — Encounter (HOSPITAL_BASED_OUTPATIENT_CLINIC_OR_DEPARTMENT_OTHER): Payer: Self-pay | Admitting: General Surgery

## 2023-06-25 ENCOUNTER — Ambulatory Visit (INDEPENDENT_AMBULATORY_CARE_PROVIDER_SITE_OTHER): Admitting: Podiatry

## 2023-06-25 ENCOUNTER — Encounter: Payer: Self-pay | Admitting: Podiatry

## 2023-06-25 DIAGNOSIS — L600 Ingrowing nail: Secondary | ICD-10-CM

## 2023-06-25 DIAGNOSIS — G629 Polyneuropathy, unspecified: Secondary | ICD-10-CM

## 2023-06-25 MED ORDER — PREGABALIN 75 MG PO CAPS: 75.0000 mg | ORAL_CAPSULE | Freq: Two times a day (BID) | ORAL | 2 refills | Status: DC

## 2023-06-25 NOTE — Patient Instructions (Signed)
 Soak Instructions    THE DAY AFTER THE PROCEDURE  Place 1/4 cup of epsom salts in a quart of warm tap water.  Submerge your foot or feet with outer bandage intact for the initial soak; this will allow the bandage to become moist and wet for easy lift off.  Once you remove your bandage, continue to soak in the solution for 20 minutes.  This soak should be done twice a day.  Next, remove your foot or feet from solution, blot dry the affected area and cover.  You may use a band aid large enough to cover the area or use gauze and tape.  Apply other medications to the area as directed by the doctor such as polysporin neosporin.  IF YOUR SKIN BECOMES IRRITATED WHILE USING THESE INSTRUCTIONS, IT IS OKAY TO SWITCH TO  WHITE VINEGAR AND WATER. Or you may use antibacterial soap and water to keep the toe clean  Monitor for any signs/symptoms of infection. Call the office immediately if any occur or go directly to the emergency room. Call with any questions/concerns.  --   Pregabalin Capsules What is this medication? PREGABALIN (pre GAB a lin) treats nerve pain. It may also be used to prevent and control seizures in people with epilepsy. It works by calming overactive nerves in your body. This medicine may be used for other purposes; ask your health care provider or pharmacist if you have questions. COMMON BRAND NAME(S): Lyrica What should I tell my care team before I take this medication? They need to know if you have any of these conditions: Heart failure Kidney disease Lung disease Substance use disorder Suicidal thoughts, plans or attempt by you or a family member An unusual or allergic reaction to pregabalin, other medications, foods, dyes, or preservatives Pregnant or trying to get pregnant Breast-feeding How should I use this medication? Take this medication by mouth with water. Take it as directed on the prescription label at the same time every day. You can take it with or without food.  If it upsets your stomach, take it with food. Keep taking it unless your care team tells you to stop. A special MedGuide will be given to you by the pharmacist with each prescription and refill. Be sure to read this information carefully each time. Talk to your care team about the use of this medication in children. While it may be prescribed for children as young as 1 month for selected conditions, precautions do apply. Overdosage: If you think you have taken too much of this medicine contact a poison control center or emergency room at once. NOTE: This medicine is only for you. Do not share this medicine with others. What if I miss a dose? If you miss a dose, take it as soon as you can. If it is almost time for your next dose, take only that dose. Do not take double or extra doses. What may interact with this medication? This medication may interact with the following: Alcohol Antihistamines for allergy, cough, and cold Certain medications for anxiety or sleep Certain medications for blood pressure, heart disease Certain medications for depression like amitriptyline, fluoxetine, sertraline Certain medications for diabetes, like pioglitazone, rosiglitazone Certain medications for seizures like phenobarbital, primidone General anesthetics like halothane, isoflurane, methoxyflurane, propofol Medications that relax muscles for surgery Opioid medications for pain Phenothiazines like chlorpromazine, mesoridazine, prochlorperazine, thioridazine This list may not describe all possible interactions. Give your health care provider a list of all the medicines, herbs, non-prescription drugs, or dietary  supplements you use. Also tell them if you smoke, drink alcohol, or use illegal drugs. Some items may interact with your medicine. What should I watch for while using this medication? Visit your care team for regular checks on your progress. Tell your care team if your symptoms do not start to get better  or if they get worse. Do not suddenly stop taking this medication. You may develop a severe reaction. Your care team will tell you how much medication to take. If your care team wants you to stop the medication, the dose may be slowly lowered over time to avoid any side effects. This medication may affect your coordination, reaction time, or judgment. Do not drive or operate machinery until you know how this medication affects you. Sit up or stand slowly to reduce the risk of dizzy or fainting spells. Drinking alcohol with this medication can increase the risk of these side effects. If you or your family notice any changes in your behavior, such as new or worsening depression, thoughts of harming yourself, anxiety, other unusual or disturbing thoughts, or memory loss, call your care team right away. Wear a medical ID bracelet or chain if you are taking this medication for seizures. Carry a card that describes your condition. List the medications and doses you take on the card. This medication may make it more difficult to father a child. Talk to your care team if you are concerned about your fertility. What side effects may I notice from receiving this medication? Side effects that you should report to your care team as soon as possible: Allergic reactions or angioedema--skin rash, itching, hives, swelling of the face, eyes, lips, tongue, arms, or legs, trouble swallowing or breathing Blurry vision Thoughts of suicide or self-harm, worsening mood, feelings of depression Trouble breathing Side effects that usually do not require medical attention (report to your care team if they continue or are bothersome): Dizziness Drowsiness Dry mouth Nausea Swelling of the ankles, feet, hands Vomiting Weight gain This list may not describe all possible side effects. Call your doctor for medical advice about side effects. You may report side effects to FDA at 1-800-FDA-1088. Where should I keep my  medication? Keep out of the reach of children and pets. This medication can be abused. Keep it in a safe place to protect it from theft. Do not share it with anyone. It is only for you. Selling or giving away this medication is dangerous and against the law. Store at [employer] C (77 degrees F). Get rid of any unused medication after the expiration date. This medication may cause harm and death if it is taken by other adults, children, or pets. It is important to get rid of the medication as soon as you no longer need it, or it is expired. You can do this in two ways: Take the medication to a medication take-back program. Check with your pharmacy or law enforcement to find a location. If you cannot return the medication, check the label or package insert to see if the medication should be thrown out in the garbage or flushed down the toilet. If you are not sure, ask your care team. If it is safe to put it in the trash, take the medication out of the container. Mix the medication with cat litter, dirt, coffee grounds, or other unwanted substance. Seal the mixture in a bag or container. Put it in the trash. NOTE: This sheet is a summary. It may not cover all possible information.  If you have questions about this medicine, talk to your doctor, pharmacist, or health care provider.  2024 Elsevier/Gold Standard (2020-12-25 00:00:00)

## 2023-06-25 NOTE — Progress Notes (Signed)
  Subjective:  Patient ID: JAQUEL COOMER, female    DOB: Feb 03, 1961,  MRN: [id number]  Chief Complaint  Patient presents with   Ingrown Toenail    Rm#11 right big toe ingrown redness painful patient states has been soaking daily no relief.    Discussed the use of AI scribe software for clinical note transcription with the patient, who gave verbal consent to proceed.  History of Present Illness ROBERTTA HALFHILL is a 63 year old female who presents with toenail deformity and neuropathy following chemotherapy.  She experiences persistent toenail issues. She was having fingernail issues as well and she has been using a topical strengthener on the fingernails. One of the toenails, on the right deviates laterally and remains deformed despite previous antibiotic treatment.  Neuropathy affects the bottom of her feet, causing numbness and balance issues. She has fallen and sustained a black eye due to these balance issues. Neuropathy is worse in one foot, with tingling and stinging sensations at night disrupting sleep. She has not taken medication for neuropathy but has tried wearing socks to alleviate symptoms.  She completed chemotherapy and radiation for breast cancer, during which she used ice on her feet to manage symptoms. She attends physical therapy for balance issues. No current cancer treatments.      Objective:    Physical Exam   General: AAO x3, NAD  Dermatological: Incurvation present along the hallux toenail localized edema erythema along the lateral aspect likely more from inflammation as opposed to infection.  There is no drainage or pus.  There is no ascending cellulitis.  No fluctuation, crepitation, malodor.  Vascular: Dorsalis Pedis artery and Posterior Tibial artery pedal pulses are 2/4 bilateral with immedate capillary fill time.  There is no pain with calf compression, swelling, warmth, erythema.   Neruologic: Grossly intact via light touch bilateral.     Musculoskeletal: Tenderness on the ingrown toenail.  No other areas of discomfort. Gait: Unassisted, Nonantalgic.   No images are attached to the encounter.    Results     Assessment:   1. Ingrown toenail   2. Neuropathy      Plan:  Patient was evaluated and treated and all questions answered.  Assessment and Plan Assessment & Plan Onychocryptosis Chronic lateral toenail growth with separation, inflamed but not infected. Previous management included nail trimming and protection. Plan for nail removal and phenol application to help prevent recurrence.She prefers scheduling post-beach trip to avoid complications from soaking and sand exposure. - Schedule nail removal procedure post-beach trip. - Perform nail removal with local anesthesia and female application to prevent recurrence. - Prescribe antibiotics post-procedure to prevent infection. - Provide post-procedure care instructions: soak in Epsom salts and apply antibiotic ointment.  Chemotherapy-induced peripheral neuropathy Numbness and tingling in feet, especially nocturnal, affecting balance and causing a fall. Result of prior cancer treatments. Gabapentin was not tolerated due to rash. Lyrica (pregabalin) proposed as an alternative. She is eager to try Lyrica to improve sleep and reduce symptoms. Informed of potential side effects.Physical therapy considered for balance improvement. - Prescribe Lyrica (pregabalin) 75 mg twice daily, starting with nighttime dosing to assess tolerance. - Advise reporting of side effects such as drowsiness or rash. - Consider physical therapy for balance improvement.  Vivi Barrack DPM     Return for ingrown toenail removal .

## 2023-07-10 ENCOUNTER — Other Ambulatory Visit: Payer: Self-pay | Admitting: *Deleted

## 2023-07-10 ENCOUNTER — Ambulatory Visit (INDEPENDENT_AMBULATORY_CARE_PROVIDER_SITE_OTHER): Admitting: Podiatry

## 2023-07-10 ENCOUNTER — Encounter: Payer: Self-pay | Admitting: Podiatry

## 2023-07-10 ENCOUNTER — Telehealth: Payer: Self-pay | Admitting: *Deleted

## 2023-07-10 ENCOUNTER — Encounter: Payer: Self-pay | Admitting: Adult Health

## 2023-07-10 DIAGNOSIS — L600 Ingrowing nail: Secondary | ICD-10-CM

## 2023-07-10 DIAGNOSIS — R197 Diarrhea, unspecified: Secondary | ICD-10-CM

## 2023-07-10 NOTE — Telephone Encounter (Signed)
 This RN spoke with pt per her My Chart message stating on going diarrhea not controlled well and interfering with her ADL's.   She states diarrhea can start in am after getting up - whether she eats or not - but absolutely occurs after eating.  She takes imodium AD and lomotil only in the am- with some benefit but then the diarrhea continues thru the day.  She does not take the anti diarrhea medication until after she has a "watery stool"  She does not repeat use of anti diarrhea after am dosing.  This RN recommended for pt to be more proactive with dosing - but also need for pt to come in for lab for stool sample for evaluation of imbalance of gut flora that could be more serious.  Pt verbalized understanding.  Plan per discussion and review with [facility]/NP - pt will use anti diarrhea med over the weekend for benefit but on Monday 4/7 she will hold med and come in for lab for stool sample.  Will put on Lindsey schedule for phone call to follow up on 4/9 post lab resulting.

## 2023-07-10 NOTE — Patient Instructions (Signed)

## 2023-07-12 NOTE — Telephone Encounter (Signed)
 Please note I would like c diff, stool ova and parasite, stool culture, and stool panel.

## 2023-07-13 ENCOUNTER — Inpatient Hospital Stay

## 2023-07-13 ENCOUNTER — Encounter: Payer: Self-pay | Admitting: Adult Health

## 2023-07-13 NOTE — Progress Notes (Signed)
 Subjective: Chief Complaint  Patient presents with   Ingrown Toenail    Patient states she has an ingrown toe nail on right hallux    63 year old female presents the [place] about concerns.  She said that she still gets tenderness on the right lateral ingrown toenail on the big toe.  No drainage or pus or any changes that saw her last.  She presented to have this removed.  Objective: AAO x3, NAD DP/PT pulses palpable bilaterally, CRT less than 3 seconds Incurvation present to right lateral hallux toenail with tenderness to palpation. There are not signs of infection.  Slight edema.  No erythema or warmth.  No open lesions. No pain with calf compression, swelling, warmth, erythema  Assessment: Ingrown toenail right hallux  Plan: -All treatment options discussed with the patient including all alternatives, risks, complications.  -At this time, the patient is requesting partial nail removal with chemical matricectomy to the symptomatic portion of the nail. Risks and complications were discussed with the patient for which they understand and written consent was obtained. Under sterile conditions a total of 3 mL of a mixture of 2% lidocaine plain and 0.5% Marcaine plain was infiltrated in a hallux block fashion. Once anesthetized, the skin was prepped in sterile fashion. A tourniquet was then applied. Next the lateral aspect of hallux nail border was then sharply excised making sure to remove the entire offending nail border. Once the nails were ensured to be removed area was debrided and the underlying skin was intact. There is no purulence identified in the procedure. Next phenol was then applied under standard conditions and copiously irrigated.  Silvadene was applied. A dry sterile dressing was applied. After application of the dressing the tourniquet was removed and there is found to be an immediate capillary refill time to the digit. The patient tolerated the procedure well any complications. Post  procedure instructions were discussed the patient for which he verbally understood. Discussed signs/symptoms of infection and directed to call the office immediately should any occur or go directly to the emergency room. In the meantime, encouraged to call the office with any questions, concerns, changes symptoms. -Patient encouraged to call the office with any questions, concerns, change in symptoms.   Vivi Barrack DPM

## 2023-07-15 ENCOUNTER — Telehealth: Admitting: Adult Health

## 2023-07-16 ENCOUNTER — Other Ambulatory Visit: Payer: Self-pay | Admitting: *Deleted

## 2023-07-16 ENCOUNTER — Inpatient Hospital Stay: Attending: Adult Health

## 2023-07-16 DIAGNOSIS — A048 Other specified bacterial intestinal infections: Secondary | ICD-10-CM | POA: Insufficient documentation

## 2023-07-16 DIAGNOSIS — R197 Diarrhea, unspecified: Secondary | ICD-10-CM

## 2023-07-16 LAB — C DIFFICILE QUICK SCREEN W PCR REFLEX
C Diff antigen: NEGATIVE
C Diff interpretation: NOT DETECTED
C Diff toxin: NEGATIVE

## 2023-07-17 ENCOUNTER — Other Ambulatory Visit: Payer: Self-pay | Admitting: Adult Health

## 2023-07-17 DIAGNOSIS — A044 Other intestinal Escherichia coli infections: Secondary | ICD-10-CM

## 2023-07-17 LAB — GASTROINTESTINAL PANEL BY PCR, STOOL (REPLACES STOOL CULTURE)

## 2023-07-17 MED ORDER — CIPROFLOXACIN HCL 500 MG PO TABS: 500.0000 mg | ORAL_TABLET | Freq: Two times a day (BID) | ORAL | 0 refills | Status: AC

## 2023-07-17 NOTE — Progress Notes (Signed)
 LMOM to review the Ecoli in her stool.  Reviewed pathogen with ID NP Rexene Alberts who recommended Cipro 500mg  PO bid 3-5 days.     Prescription sent to her pharmacy.    Lillard Anes, NP 07/17/23 1:19 PM Medical Oncology and Hematology [facility] [address] [place], [place] [address] Tel. [phone]    Fax. [phone]

## 2023-07-19 ENCOUNTER — Encounter: Payer: Self-pay | Admitting: Adult Health

## 2023-07-29 ENCOUNTER — Telehealth: Admitting: Adult Health

## 2023-08-04 ENCOUNTER — Encounter: Payer: Self-pay | Admitting: Hematology and Oncology

## 2023-08-04 NOTE — Telephone Encounter (Signed)
 No entry

## 2023-09-08 ENCOUNTER — Encounter: Payer: Self-pay | Admitting: Hematology and Oncology

## 2023-09-14 ENCOUNTER — Encounter: Payer: Self-pay | Admitting: Hematology and Oncology

## 2023-09-15 ENCOUNTER — Inpatient Hospital Stay: Payer: 59 | Attending: Adult Health | Admitting: Adult Health

## 2023-09-15 ENCOUNTER — Encounter: Payer: Self-pay | Admitting: Adult Health

## 2023-09-15 VITALS — BP 112/64 | HR 75 | Temp 97.6°F | Resp 18 | Ht 68.0 in | Wt 165.7 lb

## 2023-09-15 DIAGNOSIS — Z79899 Other long term (current) drug therapy: Secondary | ICD-10-CM | POA: Insufficient documentation

## 2023-09-15 DIAGNOSIS — Z9221 Personal history of antineoplastic chemotherapy: Secondary | ICD-10-CM | POA: Diagnosis not present

## 2023-09-15 DIAGNOSIS — Z923 Personal history of irradiation: Secondary | ICD-10-CM | POA: Insufficient documentation

## 2023-09-15 DIAGNOSIS — Z7982 Long term (current) use of aspirin: Secondary | ICD-10-CM | POA: Diagnosis not present

## 2023-09-15 DIAGNOSIS — Z1722 Progesterone receptor negative status: Secondary | ICD-10-CM | POA: Diagnosis not present

## 2023-09-15 DIAGNOSIS — C50412 Malignant neoplasm of upper-outer quadrant of left female breast: Secondary | ICD-10-CM | POA: Diagnosis present

## 2023-09-15 DIAGNOSIS — Z1731 Human epidermal growth factor receptor 2 positive status: Secondary | ICD-10-CM | POA: Insufficient documentation

## 2023-09-15 DIAGNOSIS — Z171 Estrogen receptor negative status [ER-]: Secondary | ICD-10-CM | POA: Insufficient documentation

## 2023-09-15 DIAGNOSIS — Z13820 Encounter for screening for osteoporosis: Secondary | ICD-10-CM | POA: Diagnosis not present

## 2023-09-15 MED ORDER — CLOBETASOL PROPIONATE 0.05 % EX OINT: 1.0000 | TOPICAL_OINTMENT | Freq: Two times a day (BID) | CUTANEOUS | 0 refills | Status: AC

## 2023-09-15 NOTE — Progress Notes (Unsigned)
 SURVIVORSHIP VISIT:  BRIEF ONCOLOGIC HISTORY:  Oncology History  Malignant neoplasm of upper-outer quadrant of left breast in female, estrogen receptor negative (HCC)  05/15/2022 Initial Diagnosis   Palpable mass in the left breast at 1 o'clock position measured 4.2 cm ultrasound-guided biopsy revealed grade 3 IDC ER/PR negative HER2 positive Ki-67 35%, left axillary lymph node: Biopsy positive   05/21/2022 Cancer Staging   Staging form: Breast, AJCC 8th Edition - Clinical stage from 05/21/2022: Stage IIB (cT2, cN1, cM0, G3, ER-, PR-, HER2+) - Signed by Cameron Cea, MD on 05/21/2022 Stage prefix: Initial diagnosis Histologic grading system: 3 grade system   05/29/2022 Genetic Testing   Negative [facility] Multi-Cancer +RNA Panel.  Report date is 05/29/2022.   The Multi-Cancer + RNA Panel offered by [facility] includes sequencing and/or deletion/duplication analysis of the following 70 genes:  AIP*, ALK, APC*, ATM*, AXIN2*, BAP1*, BARD1*, BLM*, BMPR1A*, BRCA1*, BRCA2*, BRIP1*, CDC73*, CDH1*, CDK4, CDKN1B*, CDKN2A, CHEK2*, CTNNA1*, DICER1*, EPCAM (del/dup only), EGFR, FH*, FLCN*, GREM1 (promoter dup only), HOXB13, KIT, LZTR1, MAX*, MBD4, MEN1*, MET, MITF, MLH1*, MSH2*, MSH3*, MSH6*, MUTYH*, NF1*, NF2*, NTHL1*, PALB2*, PDGFRA, PMS2*, POLD1*, POLE*, POT1*, PRKAR1A*, PTCH1*, PTEN*, RAD51C*, RAD51D*, RB1*, RET, SDHA* (sequencing only), SDHAF2*, SDHB*, SDHC*, SDHD*, SMAD4*, SMARCA4*, SMARCB1*, SMARCE1*, STK11*, SUFU*, TMEM127*, TP53*, TSC1*, TSC2*, VHL*. RNA analysis is performed for * genes.   06/05/2022 - 10/29/2022 Chemotherapy   Patient is on Treatment Plan : BREAST  Docetaxel  + Carboplatin  + Trastuzumab  + Pertuzumab   (TCHP) q21d      11/04/2022 Surgery   Left lumpectomy: No residual invasive cancer identified. 0/4 lymph nodes negative pathologic complete response    11/20/2022 - 06/04/2023 Chemotherapy   Patient is on Treatment Plan : BREAST Trastuzumab   + Pertuzumab  q21d x 13 cycles     01/27/2023 -  03/13/2023 Radiation Therapy   Adjuvant radiation Plan Name: Breast_L_BH Site: Breast, Left Technique: 3D Mode: Photon Dose Per Fraction: 1.8 Gy Prescribed Dose (Delivered / Prescribed): 50.4 Gy / 50.4 Gy Prescribed Fxs (Delivered / Prescribed): 28 / 28   Plan Name: Brst_L_SCV_BH Site: Sclav-LT Technique: 3D Mode: Photon Dose Per Fraction: 1.8 Gy Prescribed Dose (Delivered / Prescribed): 50.4 Gy / 50.4 Gy Prescribed Fxs (Delivered / Prescribed): 28 / 28   Plan Name: Brst_L_BH_Bst Site: Breast, Left Technique: 3D Mode: Photon Dose Per Fraction: 2 Gy Prescribed Dose (Delivered / Prescribed): 10 Gy / 10 Gy Prescribed Fxs (Delivered / Prescribed): 5 / 5     INTERVAL HISTORY:  Erika [name] to review her survivorship care plan detailing her treatment course for breast cancer, as well as monitoring long-term side effects of that treatment, education regarding health maintenance, screening, and overall wellness and health promotion.     Overall, Erika [name] reports feeling quite well.  She denies any new issues today and brought some questions for her visit including the timing of Guardant reveal testing, and considerations for lymphedema prevention.  REVIEW OF SYSTEMS:  Review of Systems  Constitutional:  Negative for appetite change, chills, fatigue, fever and unexpected weight change.  HENT:   Negative for hearing loss, lump/mass and trouble swallowing.   Eyes:  Negative for eye problems and icterus.  Respiratory:  Negative for chest tightness, cough and shortness of breath.   Cardiovascular:  Negative for chest pain, leg swelling and palpitations.  Gastrointestinal:  Negative for abdominal distention, abdominal pain, constipation, diarrhea, nausea and vomiting.  Endocrine: Negative for hot flashes.  Genitourinary:  Negative for difficulty urinating.   Musculoskeletal:  Negative for arthralgias.  Skin:  Negative for itching and rash.  Neurological:  Negative for dizziness,  extremity weakness, headaches and numbness.  Hematological:  Negative for adenopathy. Does not bruise/bleed easily.  Psychiatric/Behavioral:  Negative for depression. The patient is not nervous/anxious.   Breast: Denies any new nodularity, masses, tenderness, nipple changes, or nipple discharge.       PAST MEDICAL/SURGICAL HISTORY:  Past Medical History:  Diagnosis Date   Anxiety    Breast cancer (HCC)    Colitis 04/07/2008   seen on CT (at ER visit)   Depression    Family history of adverse reaction to anesthesia    mother with h/o nausea   GERD (gastroesophageal reflux disease)    History of radiation therapy    Left breast-01/27/23-03/13/23- Dr. Retta Caster   Hyperplastic colon polyp    Hypertension    Liver hemangioma 04/07/2008   seen on CT   Personal history of chemotherapy    Personal history of radiation therapy    Port-A-Cath in place 06/03/2022   Past Surgical History:  Procedure Laterality Date   freezing of cervix     ABDOMINAL HYSTERECTOMY     BREAST BIOPSY Left 05/15/2022   US  LT BREAST BX W LOC DEV 1ST LESION IMG BX SPEC US  GUIDE 05/15/2022 [facility]   BREAST BIOPSY  11/03/2022   MM LT RADIOACTIVE SEED LOC MAMMO GUIDE 11/03/2022 [facility]   BREAST BIOPSY  11/03/2022   MM LT RADIOACTIVE SEED EA ADD LESION LOC MAMMO GUIDE 11/03/2022 [facility]   BREAST BIOPSY Left 11/03/2022   US  LT RADIOACTIVE SEED LOC 11/03/2022 [facility]   BREAST LUMPECTOMY Left 11/04/2022   BREAST LUMPECTOMY WITH RADIOACTIVE SEED AND SENTINEL LYMPH NODE BIOPSY Left 11/04/2022   Procedure: LEFT BREAST SEED BRACKETED LUMPECTOMY, LEFT SENTINEL LYMPH NODE BIOPSY;  Surgeon: Lockie Rima, MD;  Location: [facility];  Service: General;  Laterality: Left;  PEC BLOCK   BREAST RECONSTRUCTION Left 11/14/2022   Procedure: LEFT  ONCOPLASTIC BREAST RECONSTRUCTION;  Surgeon: Alger Infield, MD;  Location: [facility];  Service: Plastics;   Laterality: Left;   BREAST REDUCTION SURGERY Right 11/14/2022   Procedure: MAMMARY REDUCTION  (BREAST);  Surgeon: Alger Infield, MD;  Location: [facility];  Service: Plastics;  Laterality: Right;   CESAREAN SECTION     NASAL SINUS SURGERY     PORT-A-CATH REMOVAL N/A 06/18/2023   Procedure: PORT REMOVAL;  Surgeon: Lockie Rima, MD;  Location: [facility];  Service: General;  Laterality: N/A;   PORTACATH PLACEMENT N/A 05/29/2022   Procedure: INSERTION PORT-A-CATH;  Surgeon: Lockie Rima, MD;  Location: [facility];  Service: General;  Laterality: N/A;   RADIOACTIVE SEED GUIDED AXILLARY SENTINEL LYMPH NODE Left 11/04/2022   Procedure: SEED TARGETED LEFT AXILLARY LYMPH NODE EXCISIONAL BIOPSY;  Surgeon: Lockie Rima, MD;  Location:  SURGERY CENTER;  Service: General;  Laterality: Left;   REDUCTION MAMMAPLASTY Right 11/14/2022   right kneecap surgery +       ALLERGIES:  Allergies  Allergen Reactions   Gabapentin  (Once-Daily) Hives   Chlorhexidine  Itching    Stated pre-operative wipes (CHG wipes) made her itch   Penicillins Nausea Only     CURRENT MEDICATIONS:  Outpatient Encounter Medications as of 09/15/2023  Medication Sig   ALPRAZolam (XANAX) 0.5 MG tablet    aspirin EC 81 MG tablet Take 81 mg by mouth daily.   cetirizine  (ZYRTEC ) 5 MG tablet Take 5 mg by mouth daily.   ciprofloxacin  (  CIPRO ) 500 MG tablet Take 1 tablet (500 mg total) by mouth 2 (two) times daily.   diphenoxylate -atropine  (LOMOTIL ) 2.5-0.025 MG tablet Take 1 tablet by mouth 4 (four) times daily as needed for diarrhea or loose stools.   lisinopril (ZESTRIL) 20 MG tablet Take 20 mg by mouth daily.   omeprazole (PRILOSEC) 20 MG capsule Take by mouth.   pregabalin  (LYRICA ) 75 MG capsule Take 1 capsule (75 mg total) by mouth 2 (two) times daily. (Patient taking differently: Take 75 mg by mouth daily. At night)   rosuvastatin (CRESTOR) 20 MG tablet Take 20 mg by mouth daily.    sertraline (ZOLOFT) 100 MG tablet Take 100 mg by mouth daily.   Facility-Administered Encounter Medications as of 09/15/2023  Medication   heparin  lock flush 100 unit/mL   sodium chloride  flush (NS) 0.9 % injection 3 mL     ONCOLOGIC FAMILY HISTORY:  Family History  Problem Relation Age of Onset   Kidney cancer Mother        dx after 107; early stage   Hypertension Father    Heart disease Father    Colon polyps Father    Lung cancer Father 14       smoking hx   Colon polyps Sister    Hypertension Brother    Cancer Maternal Uncle        ? blood cancer; dx after 50   Cancer Maternal Grandmother        ? gallbladder ? colon; d. after 52   Diabetes Paternal Grandmother      SOCIAL HISTORY:  Social History   Socioeconomic History   Marital status: Married    Spouse name: Not on file   Number of children: 0   Years of education: Not on file   Highest education level: Not on file  Occupational History   Occupation: Real[employer]  Tobacco Use   Smoking status: Never   Smokeless tobacco: Never   Tobacco comments:    smoked 1 year while in college  Vaping Use   Vaping status: Never Used  Substance and Sexual Activity   Alcohol use: Yes    Alcohol/week: 14.0 standard drinks of alcohol    Types: 14 Glasses of wine per week    Comment: wine - daily   Drug use: No   Sexual activity: Not on file  Other Topics Concern   Not on file  Social History Narrative   1 caffeine drinks daily    Social Drivers of Health   Financial Resource Strain: Low Risk  (01/20/2022)   Received from [facility], [facility]   Overall Financial Resource Strain (CARDIA)    Difficulty of Paying Living Expenses: Not hard at all  Food Insecurity: No Food Insecurity (01/13/2023)   Hunger Vital Sign    Worried About Running Out of Food in the Last Year: Never true    Ran Out of Food in the Last Year: Never true  Transportation Needs: Not on file  Physical Activity: Insufficiently Active  (01/20/2022)   Received from [facility], [facility]   Exercise Vital Sign    Days of Exercise per Week: 2 days    Minutes of Exercise per Session: 20 min  Stress: Stress Concern Present (01/20/2022)   Received from [facility], [facility] - Occupational Stress Questionnaire    Feeling of Stress : To some extent  Social Connections: Socially Integrated (01/20/2022)   Received from [facility], [facility]  Social Network    How would you rate your social network (family, work, friends)?: Good participation with social networks  Intimate Partner Violence: Not At Risk (01/13/2023)   Humiliation, Afraid, Rape, and Kick questionnaire    Fear of Current or Ex-Partner: No    Emotionally Abused: No    Physically Abused: No    Sexually Abused: No     OBSERVATIONS/OBJECTIVE:  BP 112/64 (BP Location: Right Arm, Patient Position: Sitting)   Pulse 75   Temp 97.6 F (36.4 C) (Temporal)   Resp 18   Ht 5' 8 (1.727 m)   Wt 165 lb 11.2 oz (75.2 kg)   SpO2 98%   BMI 25.19 kg/m  GENERAL: Patient is a well appearing female in no acute distress HEENT:  Sclerae anicteric.  Oropharynx clear and moist. No ulcerations or evidence of oropharyngeal candidiasis. Neck is supple.  NODES:  No cervical, supraclavicular, or axillary lymphadenopathy palpated.  BREAST EXAM:  left breast s/p lumpectomy and radiation, no sign of local recurrence, right breast benign.  LUNGS:  Clear to auscultation bilaterally.  No wheezes or rhonchi. HEART:  Regular rate and rhythm. No murmur appreciated. ABDOMEN:  Soft, nontender.  Positive, normoactive bowel sounds. No organomegaly palpated. MSK:  No focal spinal tenderness to palpation. Full range of motion bilaterally in the upper extremities. EXTREMITIES:  No peripheral edema.   SKIN:  Clear with no obvious rashes or skin changes. No nail dyscrasia. NEURO:  Nonfocal. Well oriented.  Appropriate  affect.   LABORATORY DATA:  None for this visit.  DIAGNOSTIC IMAGING:  None for this visit.      ASSESSMENT AND PLAN:  Ms.. [name] is a pleasant 63 y.o. female with Stage IIB left breast invasive ductal carcinoma, ER-/PR-/HER2+, diagnosed in 05/2022, treated with neoadjvuant chemotherapy,  lumpectomy, adjuvant radiation therapy, and maintenance herceptin  perjeta  completed in 11/2022.  She presents to the Survivorship Clinic for our initial meeting and routine follow-up post-completion of treatment for breast cancer.    1. Stage IIB left breast cancer:  Ms. Criado is continuing to recover from definitive treatment for breast cancer. She will follow-up with her medical oncologist, Dr.  Lee Public in 6 months  with history and physical exam per surveillance protocol.   Her mammogram is due 05/2024; orders placed today.  We discussed Guardant Reveal testing every 6 months.     Today, a comprehensive survivorship care plan and treatment summary was reviewed with the patient today detailing her breast cancer diagnosis, treatment course, potential late/long-term effects of treatment, appropriate follow-up care with recommendations for the future, and patient education resources.  A copy of this summary, along with a letter will be sent to the patient's primary care provider via mail/fax/In Basket message after today's visit.    2. Bone health:    She was given education on specific activities to promote bone health.  3. Cancer screening:  Due to Ms. Traub's history and her age, she should receive screening for skin cancers, colon cancer, and gynecologic cancers.  The information and recommendations are listed on the patient's comprehensive care plan/treatment summary and were reviewed in detail with the patient.    4. Health maintenance and wellness promotion: Ms. Flippo was encouraged to consume 5-7 servings of fruits and vegetables per day. We reviewed the Nutrition [employer] and ACLM handouts.  She was  also encouraged to engage in moderate to vigorous exercise for 30 minutes per day most days of the week.  She was instructed to limit her alcohol  consumption and continue to abstain from tobacco use.     5. Support services/counseling: It is not uncommon for this period of the patient's cancer care trajectory to be one of many emotions and stressors.   She was given information regarding our available services and encouraged to contact me with any questions or for help enrolling in any of our support group/programs.    Follow up instructions:    -Return to cancer center in 6 months for f/u   -Mammogram due in 05/2024 -She is welcome to return back to the Survivorship Clinic at any time; no additional follow-up needed at this time.  -Consider referral back to survivorship as a long-term survivor for continued surveillance  The patient was provided an opportunity to ask questions and all were answered. The patient agreed with the plan and demonstrated an understanding of the instructions.   Total encounter time:30 minutes*in face-to-face visit time, chart review, lab review, care coordination, order entry, and documentation of the encounter time.    Alwin Baars, NP 09/15/23 2:23 PM Medical Oncology and Hematology [facility] [address] [place], [place] [address] Tel. [phone]    Fax. [phone]  *Total Encounter Time as defined by the Centers for Medicare and Medicaid Services includes, in addition to the face-to-face time of a patient visit (documented in the note above) non-face-to-face time: obtaining and reviewing outside history, ordering and reviewing medications, tests or procedures, care coordination (communications with other health care professionals or caregivers) and documentation in the medical record.

## 2023-10-05 ENCOUNTER — Encounter: Payer: Self-pay | Admitting: Adult Health

## 2023-11-07 ENCOUNTER — Other Ambulatory Visit: Payer: Self-pay | Admitting: Podiatry

## 2023-12-08 ENCOUNTER — Other Ambulatory Visit: Payer: Self-pay | Admitting: Adult Health

## 2023-12-08 DIAGNOSIS — Z13820 Encounter for screening for osteoporosis: Secondary | ICD-10-CM

## 2023-12-09 ENCOUNTER — Telehealth: Payer: Self-pay | Admitting: *Deleted

## 2023-12-09 NOTE — Telephone Encounter (Signed)
 Per Morna Kendall, NP, called pt with message below and left on personal cell. Advised to call office with questions or concern.

## 2023-12-09 NOTE — Telephone Encounter (Signed)
-----   Message from Erika [name] sent at 12/08/2023  5:53 PM EDT ----- Please let patient know that [facility] is no longer doing bone density testing.  I placed orders for her to undergo testing at [facility]( [facility] in February.

## 2024-03-16 ENCOUNTER — Inpatient Hospital Stay: Attending: Hematology and Oncology | Admitting: Hematology and Oncology

## 2024-03-16 VITALS — BP 115/68 | HR 94 | Temp 97.8°F | Resp 19 | Wt 176.3 lb

## 2024-03-16 DIAGNOSIS — C50412 Malignant neoplasm of upper-outer quadrant of left female breast: Secondary | ICD-10-CM | POA: Diagnosis not present

## 2024-03-16 DIAGNOSIS — T451X5A Adverse effect of antineoplastic and immunosuppressive drugs, initial encounter: Secondary | ICD-10-CM | POA: Insufficient documentation

## 2024-03-16 DIAGNOSIS — Z1722 Progesterone receptor negative status: Secondary | ICD-10-CM | POA: Diagnosis not present

## 2024-03-16 DIAGNOSIS — Z7982 Long term (current) use of aspirin: Secondary | ICD-10-CM | POA: Diagnosis not present

## 2024-03-16 DIAGNOSIS — G620 Drug-induced polyneuropathy: Secondary | ICD-10-CM | POA: Diagnosis not present

## 2024-03-16 DIAGNOSIS — Z171 Estrogen receptor negative status [ER-]: Secondary | ICD-10-CM

## 2024-03-16 DIAGNOSIS — Z1731 Human epidermal growth factor receptor 2 positive status: Secondary | ICD-10-CM | POA: Diagnosis not present

## 2024-03-16 DIAGNOSIS — Z79899 Other long term (current) drug therapy: Secondary | ICD-10-CM | POA: Diagnosis not present

## 2024-03-16 DIAGNOSIS — E669 Obesity, unspecified: Secondary | ICD-10-CM | POA: Diagnosis not present

## 2024-03-16 DIAGNOSIS — F419 Anxiety disorder, unspecified: Secondary | ICD-10-CM | POA: Diagnosis not present

## 2024-03-16 NOTE — Progress Notes (Signed)
 Patient Care Team: Katina Pfeiffer, PA-C as PCP - General (Family Medicine) Aron Shoulders, MD as Consulting Physician (General Surgery) Odean Potts, MD as Consulting Physician (Hematology and Oncology) Shannon Agent, MD as Consulting Physician (Radiation Oncology)  DIAGNOSIS:  Encounter Diagnosis  Name Primary?   Malignant neoplasm of upper-outer quadrant of left breast in female, estrogen receptor negative (HCC) Yes    SUMMARY OF ONCOLOGIC HISTORY: Oncology History  Malignant neoplasm of upper-outer quadrant of left breast in female, estrogen receptor negative (HCC)  05/15/2022 Initial Diagnosis   Palpable mass in the left breast at 1 o'clock position measured 4.2 cm ultrasound-guided biopsy revealed grade 3 IDC ER/PR negative HER2 positive Ki-67 35%, left axillary lymph node: Biopsy positive   05/21/2022 Cancer Staging   Staging form: Breast, AJCC 8th Edition - Clinical stage from 05/21/2022: Stage IIB (cT2, cN1, cM0, G3, ER-, PR-, HER2+) - Signed by Odean Potts, MD on 05/21/2022 Stage prefix: Initial diagnosis Histologic grading system: 3 grade system   05/29/2022 Genetic Testing   Negative [facility] Multi-Cancer +RNA Panel.  Report date is 05/29/2022.   The Multi-Cancer + RNA Panel offered by [facility] includes sequencing and/or deletion/duplication analysis of the following 70 genes:  AIP*, ALK, APC*, ATM*, AXIN2*, BAP1*, BARD1*, BLM*, BMPR1A*, BRCA1*, BRCA2*, BRIP1*, CDC73*, CDH1*, CDK4, CDKN1B*, CDKN2A, CHEK2*, CTNNA1*, DICER1*, EPCAM (del/dup only), EGFR, FH*, FLCN*, GREM1 (promoter dup only), HOXB13, KIT, LZTR1, MAX*, MBD4, MEN1*, MET, MITF, MLH1*, MSH2*, MSH3*, MSH6*, MUTYH*, NF1*, NF2*, NTHL1*, PALB2*, PDGFRA, PMS2*, POLD1*, POLE*, POT1*, PRKAR1A*, PTCH1*, PTEN*, RAD51C*, RAD51D*, RB1*, RET, SDHA* (sequencing only), SDHAF2*, SDHB*, SDHC*, SDHD*, SMAD4*, SMARCA4*, SMARCB1*, SMARCE1*, STK11*, SUFU*, TMEM127*, TP53*, TSC1*, TSC2*, VHL*. RNA analysis is performed for * genes.    06/05/2022 - 10/29/2022 Chemotherapy   Patient is on Treatment Plan : BREAST  Docetaxel  + Carboplatin  + Trastuzumab  + Pertuzumab   (TCHP) q21d      11/04/2022 Surgery   Left lumpectomy: No residual invasive cancer identified. 0/4 lymph nodes negative pathologic complete response    11/20/2022 - 06/04/2023 Chemotherapy   Patient is on Treatment Plan : BREAST Trastuzumab   + Pertuzumab  q21d x 13 cycles     01/27/2023 - 03/13/2023 Radiation Therapy   Adjuvant radiation Plan Name: Breast_L_BH Site: Breast, Left Technique: 3D Mode: Photon Dose Per Fraction: 1.8 Gy Prescribed Dose (Delivered / Prescribed): 50.4 Gy / 50.4 Gy Prescribed Fxs (Delivered / Prescribed): 28 / 28   Plan Name: Brst_L_SCV_BH Site: Sclav-LT Technique: 3D Mode: Photon Dose Per Fraction: 1.8 Gy Prescribed Dose (Delivered / Prescribed): 50.4 Gy / 50.4 Gy Prescribed Fxs (Delivered / Prescribed): 28 / 28   Plan Name: Brst_L_BH_Bst Site: Breast, Left Technique: 3D Mode: Photon Dose Per Fraction: 2 Gy Prescribed Dose (Delivered / Prescribed): 10 Gy / 10 Gy Prescribed Fxs (Delivered / Prescribed): 5 / 5     CHIEF COMPLIANT: Surveillance of breast cancer  HISTORY OF PRESENT ILLNESS:  History of Present Illness Erika [name] is a 63 year old female with stage IIB, grade 3, 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  She is about one year from her last oncology visit, with interim follow-up by another provider, and remains on maintenance trastuzumab  and pertuzumab . She has ongoing anxiety about recurrence and prefers serial blood-based surveillance Delsa), with the most recent kit sent in June, instead of annual imaging due to concerns about radiation exposure and cost. She asks about her recurrence risk  with HER2-positive  disease.  She reports new intermittent neuropathic pruritus under the skin on her back and foot, not relieved by scratching and distinct from pain or numbness. She relates this to prior neuropathy after surgery and is concerned about its prognosis and possible nerve injury. She denies neuropathic pain or numbness.  She is worried about lymphedema risk and requests SOZO bioimpedance spectroscopy for early detection and monitoring. She is familiar with the physical therapy facility that offers this.  She reports a 35-pound weight loss over three months on GLP-1 agonist therapy (Zepbound) prescribed by her cardiologist for sleep apnea, with recent plateau at the highest dose. She is concerned about the safety of GLP-1 agonists in relation to cancer recurrence. She has joint stiffness and is interested in safe over-the-counter options for joint inflammation but is cautious about new supplements after cancer treatment, given her long-term statin use and family history of hyperlipidemia.  She has not received a flu vaccine this season and asks about influenza, COVID, and RSV vaccination. She has not had a COVID vaccine in two years and has not received RSV vaccination.      ALLERGIES:  is allergic to gabapentin  (once-daily), chlorhexidine , and penicillins.  MEDICATIONS:  Current Outpatient Medications  Medication Sig Dispense Refill   ALPRAZolam (XANAX) 0.5 MG tablet      aspirin EC 81 MG tablet Take 81 mg by mouth daily.     cetirizine  (ZYRTEC ) 5 MG tablet Take 5 mg by mouth daily.     ciprofloxacin  (CIPRO ) 500 MG tablet Take 1 tablet (500 mg total) by mouth 2 (two) times daily. 10 tablet 0   clobetasol  ointment (TEMOVATE ) 0.05 % Apply 1 Application topically 2 (two) times daily. 30 g 0   diphenoxylate -atropine  (LOMOTIL ) 2.5-0.025 MG tablet Take 1 tablet by mouth 4 (four) times daily as needed for diarrhea or loose stools. 30 tablet 0   lisinopril (ZESTRIL) 20 MG tablet Take 20 mg by mouth  daily.     omeprazole (PRILOSEC) 20 MG capsule Take by mouth.     pregabalin  (LYRICA ) 75 MG capsule TAKE 1 CAPSULE BY MOUTH TWICE A DAY 60 capsule 2   rosuvastatin (CRESTOR) 20 MG tablet Take 20 mg by mouth daily.     sertraline (ZOLOFT) 100 MG tablet Take 100 mg by mouth daily.     No current facility-administered medications for this visit.   Facility-Administered Medications Ordered in Other Visits  Medication Dose Route Frequency Provider Last Rate Last Admin   heparin  lock flush 100 unit/mL  250 Units Intracatheter PRN Odean Potts, MD       sodium chloride  flush (NS) 0.9 % injection 3 mL  3 mL Intracatheter PRN Odean Potts, MD        PHYSICAL EXAMINATION: ECOG PERFORMANCE STATUS: 1 - Symptomatic but completely ambulatory  Vitals:   03/16/24 1024  BP: 115/68  Pulse: 94  Resp: 19  Temp: 97.8 F (36.6 C)  SpO2: 97%   Filed Weights   03/16/24 1024  Weight: 176 lb 4.8 oz (80 kg)    LABORATORY DATA:  I have reviewed the data as listed    Latest Ref Rng & Units 04/23/2023   12:33 PM 01/06/2023    1:46 PM 12/15/2022    8:14 AM  CMP  Glucose 70 - 99 mg/dL 876  887  98   BUN 8 - 23 mg/dL 22  24  25    Creatinine 0.44 - 1.00 mg/dL 9.04  9.12  9.09   Sodium  135 - 145 mmol/L 142  142  142   Potassium 3.5 - 5.1 mmol/L 3.8  3.9  3.7   Chloride 98 - 111 mmol/L 107  108  109   CO2 22 - 32 mmol/L 28  28  27    Calcium 8.9 - 10.3 mg/dL 9.5  9.3  9.5   Total Protein 6.5 - 8.1 g/dL 6.9  6.5  6.6   Total Bilirubin 0.0 - 1.2 mg/dL 0.4  0.3  0.3   Alkaline Phos 38 - 126 U/L 88  73  71   AST 15 - 41 U/L 23  17  18    ALT 0 - 44 U/L 28  18  18      Lab Results  Component Value Date   WBC 5.6 04/23/2023   HGB 11.2 (L) 04/23/2023   HCT 33.0 (L) 04/23/2023   MCV 99.1 04/23/2023   PLT 235 04/23/2023   NEUTROABS 3.8 04/23/2023    ASSESSMENT & PLAN:  Malignant neoplasm of upper-outer quadrant of left breast in female, estrogen receptor negative (HCC) 05/15/2022: Stage IIB ER/PR  negative, HER2 positive breast cancer here today for evaluation and follow-up prior to receiving cycle 6 of neoadjuvant Abraxane , carboplatin , Herceptin , and Perjeta .   Treatment plan: 1. Neoadjuvant chemotherapy with TCH Perjeta  6 cycles completed 09/18/2022 (Taxotere  switched with Abraxane ) followed by Herceptin  Perjeta  maintenance versus Kadcyla maintenance (based on response to neoadjuvant chemo) for 1 year completed 06/04/23 2. 11/04/2022: Left lumpectomy: No residual invasive cancer identified.  0/4 lymph nodes negative pathologic complete response 3. Followed by adjuvant radiation therapy started 01/28/2023-03/13/2023 ------------------------------------------------------------------------ Breast Cancer Surveillance: Mammograms: 05/27/2023: Benign, breast density category C MRI liver 05/31/2023: Benign Currently doing guardant reveal for MRD testing: Negative  Return to clinic in 1 year for follow-up Assessment & Plan Estrogen receptor negative malignant neoplasm of the upper-outer quadrant of the left breast  Anxiety about recurrence; prefers blood-based surveillance over imaging. Excellent prognosis with 6% recurrence risk at 10 years for HER2-positive complete responders. Guardant Reveal MRD blood test used for surveillance; investigational but effective for early detection. Insurance coverage variable; no billing if not covered. - Guardant Reveal MRD blood test every 6 months post-completion of anti-HER2 therapy for surveillance. - Educated that Guardant Reveal MRD is investigational and not included in national guidelines, but effective for early detection; discussed insurance coverage and assured she will not be billed if not covered. - If MRD test is positive, further imaging and close monitoring will be indicated. - Provided reassurance regarding excellent prognosis after pathologic complete response.  Chemotherapy-induced peripheral neuropathy Chronic neuropathic symptoms as  intermittent pruritus due to prior chemotherapy and surgical nerve injury. Symptoms may persist; monitored as part of survivorship care. - Educated that pruritus is a form of neuropathy and may persist due to nerve injury from surgery and chemotherapy. - Reassured that this is a known sequela and some symptoms may not fully resolve. - Monitored for progression or new symptoms.  Postmastectomy lymphedema syndrome Concerned about risk; interested in early detection and prevention. Surveillance ongoing with physical therapy Sozo machine. - Ordered SOZO bioimpedance spectroscopy assessment for early detection of lymphedema at physical therapy. - Physical therapy will contact her to schedule the SOZO assessment. - Educated on the purpose and process of SOZO monitoring for lymphedema surveillance. - Continued routine lymphedema surveillance as part of long-term survivorship care.  Obesity Obesity with 35-pound weight loss over three months using GLP-1 agonist (Zepbound) for sleep apnea. Concerned about GLP-1 agonists' safety regarding cancer recurrence  and joint health. GLP-1 agonists effective for weight loss; may reduce risk of breast cancer recurrence, cardiovascular disease, stroke, and dementia. No evidence of increased breast cancer recurrence risk; may be protective, especially for estrogen receptor positive cancers. - Discussed that GLP-1 agonists (Zepbound, [place]) are effective for weight loss and may reduce risk of breast cancer recurrence, cardiovascular disease, stroke, and dementia. - Educated that GLP-1 agonists do not increase risk of breast cancer recurrence and may be protective, especially for estrogen receptor positive cancers, but are beneficial for overall health. - Advised to add exercise to address weight loss. - Recommended over-the-counter Osteo Bi-Flex for joint health and inflammation. - Encouraged to discuss continuation or prescription of GLP-1 agonist with her primary care  provider or PA, and to mention oncology support. - Educated on cost and insurance coverage options for GLP-1 agonists.      No orders of the defined types were placed in this encounter.  The patient has a good understanding of the overall plan. she agrees with it. she will call with any problems that may develop before the next visit here.  I personally spent a total of 30 minutes in the care of the patient today including preparing to see the patient, getting/reviewing separately obtained history, performing a medically appropriate exam/evaluation, counseling and educating, placing orders, referring and communicating with other health care professionals, documenting clinical information in the EHR, independently interpreting results, communicating results, and coordinating care.   Viinay K Sansa Alkema, MD 03/16/24

## 2024-03-16 NOTE — Assessment & Plan Note (Signed)
 stage IIB ER/PR negative, HER2 positive breast cancer here today for evaluation and follow-up prior to receiving cycle 6 of neoadjuvant Abraxane , carboplatin , Herceptin , and Perjeta .   Treatment plan: 1. Neoadjuvant chemotherapy with [facility] Perjeta  6 cycles completed 09/18/2022 (Taxotere  switched with Abraxane ) followed by Herceptin  Perjeta  maintenance versus Kadcyla maintenance (based on response to neoadjuvant chemo) for 1 year completed 06/04/23 2. 11/04/2022: Left lumpectomy: No residual invasive cancer identified.  0/4 lymph nodes negative pathologic complete response 3. Followed by adjuvant radiation therapy started 01/28/2023-03/13/2023 ------------------------------------------------------------------------ Breast Cancer Surveillance: Mammograms: 05/27/2023: Benign, breast density category C MRI liver 05/31/2023: Benign Recommend guardant reveal for MRD testing  Return to clinic in 1 year for follow-up

## 2024-03-22 NOTE — Therapy (Signed)
 OUTPATIENT PHYSICAL THERAPY  UPPER EXTREMITY ONCOLOGY EVALUATION  Patient Name: Erika [name] MRN: [id number] DOB:Mar 20, 1961, 63 y.o., female Today's Date: 03/23/2024  END OF SESSION:   Past Medical History:  Diagnosis Date   Anxiety    Breast cancer (HCC)    Colitis 04/07/2008   seen on CT (at ER visit)   Depression    Family history of adverse reaction to anesthesia    mother with h/o nausea   GERD (gastroesophageal reflux disease)    History of radiation therapy    Left breast-01/27/23-03/13/23- Dr. Lynwood Nasuti   Hyperplastic colon polyp    Hypertension    Liver hemangioma 04/07/2008   seen on CT   Personal history of chemotherapy    Personal history of radiation therapy    Port-A-Cath in place 06/03/2022   Past Surgical History:  Procedure Laterality Date   freezing of cervix     ABDOMINAL HYSTERECTOMY     BREAST BIOPSY Left 05/15/2022   US  LT BREAST BX W LOC DEV 1ST LESION IMG BX SPEC US  GUIDE 05/15/2022 [facility]   BREAST BIOPSY  11/03/2022   MM LT RADIOACTIVE SEED LOC MAMMO GUIDE 11/03/2022 [facility]   BREAST BIOPSY  11/03/2022   MM LT RADIOACTIVE SEED EA ADD LESION LOC MAMMO GUIDE 11/03/2022 [facility]   BREAST BIOPSY Left 11/03/2022   US  LT RADIOACTIVE SEED LOC 11/03/2022 [facility]   BREAST LUMPECTOMY Left 11/04/2022   BREAST LUMPECTOMY WITH RADIOACTIVE SEED AND SENTINEL LYMPH NODE BIOPSY Left 11/04/2022   Procedure: LEFT BREAST SEED BRACKETED LUMPECTOMY, LEFT SENTINEL LYMPH NODE BIOPSY;  Surgeon: Aron Shoulders, MD;  Location: [facility];  Service: General;  Laterality: Left;  PEC BLOCK   BREAST RECONSTRUCTION Left 11/14/2022   Procedure: LEFT  ONCOPLASTIC BREAST RECONSTRUCTION;  Surgeon: Arelia Filippo, MD;  Location: [facility];  Service: Plastics;  Laterality: Left;   BREAST REDUCTION SURGERY Right 11/14/2022   Procedure: MAMMARY REDUCTION  (BREAST);  Surgeon: Arelia Filippo, MD;   Location: [facility];  Service: Plastics;  Laterality: Right;   CESAREAN SECTION     NASAL SINUS SURGERY     PORT-A-CATH REMOVAL N/A 06/18/2023   Procedure: PORT REMOVAL;  Surgeon: Aron Shoulders, MD;  Location: [facility];  Service: General;  Laterality: N/A;   PORTACATH PLACEMENT N/A 05/29/2022   Procedure: INSERTION PORT-A-CATH;  Surgeon: Aron Shoulders, MD;  Location: [facility];  Service: General;  Laterality: N/A;   RADIOACTIVE SEED GUIDED AXILLARY SENTINEL LYMPH NODE Left 11/04/2022   Procedure: SEED TARGETED LEFT AXILLARY LYMPH NODE EXCISIONAL BIOPSY;  Surgeon: Aron Shoulders, MD;  Location:  SURGERY CENTER;  Service: General;  Laterality: Left;   REDUCTION MAMMAPLASTY Right 11/14/2022   right kneecap surgery +     Patient Active Problem List   Diagnosis Date Noted   Genetic testing 05/30/2022   Therapeutic drug monitoring 05/27/2022   Malignant neoplasm of upper-outer quadrant of left breast in female, estrogen receptor negative (HCC) 05/19/2022    PCP: Charmaine Bright PA-C  REFERRING PROVIDER: Odean Potts, MD  REFERRING DIAG: C50.412,Z17.1 (ICD-10-CM) - Malignant neoplasm of upper-outer quadrant of left breast in female, estrogen receptor negative (HCC)  THERAPY DIAG:  Lymphedema, not elsewhere classified  Disorder of the skin and subcutaneous tissue related to radiation, unspecified  Malignant neoplasm of upper-outer quadrant of left breast in female, estrogen receptor negative (HCC)  ONSET DATE: 11/04/22  Rationale for Evaluation and Treatment: Rehabilitation  SUBJECTIVE:  SUBJECTIVE STATEMENT: I want to make sure I do everything I can to prevent lymphedema.   PERTINENT HISTORY: Patient was diagnosed on 05/08/2022 with left grade 3 invasive ductal  carcinoma breast cancer. It measures 4.2 cm and is located in the upper outer quadrant. It is ER/PR negative and HER2 positive with a Ki67 of 35%. 11/04/22- L breast lumpectomy and SLNB 0/4, 11/14/22- bilateral breast reduction. Had completed chemo and radiation as well as immunotherapy in Feb 2025  PAIN:  Are you having pain? No  PRECAUTIONS: Other: at risk of L UE lymphedema  RED FLAGS: None   WEIGHT BEARING RESTRICTIONS: No  FALLS:  Has patient fallen in last 6 months? 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  COGNITION: Overall cognitive status: Within functional limits for tasks assessed   POSTURE: forward head, rounded shoulders  UPPER EXTREMITY AROM/PROM: WFL    L-DEX FLOWSHEETS - 03/23/24 1100       L-DEX LYMPHEDEMA SCREENING   Measurement Type Unilateral    L-DEX MEASUREMENT EXTREMITY Upper Extremity    POSITION  Standing    DOMINANT SIDE Right    At Risk Side Left    BASELINE SCORE (UNILATERAL) 1.2    L-DEX SCORE (UNILATERAL) 2.6    VALUE CHANGE (UNILAT) 1.4          L-DEX LYMPHEDEMA SCREENING: The patient was assessed using the L-Dex machine today to produce a lymphedema index baseline score. The patient will be reassessed on a regular basis (typically every 3 months) to obtain new L-Dex scores. If the score is > 6.5 points away from his/her baseline score indicating onset of subclinical lymphedema, it will be recommended to wear a compression garment for 4 weeks, 12  hours per day and then be reassessed. If the score continues to be > 6.5 points from baseline at reassessment, we will initiate lymphedema treatment. Assessing in this manner has a 95% rate of preventing clinically significant lymphedema.  QUICK DASH SURVEY:   Erika [name] - 03/23/24 0001     Open a tight or new jar No difficulty    Do heavy household chores (wash walls, wash floors) No difficulty    Carry a shopping bag or briefcase No difficulty    Wash your back No difficulty    Use a knife to cut food No difficulty    Recreational activities in which you take some force or impact through your arm, shoulder, or hand (golf, hammering, tennis) No difficulty    During the past week, to what extent has your arm, shoulder or hand problem interfered with your normal social activities with family, friends, neighbors, or groups? Not at all    During the past week, to what extent has your arm, shoulder or hand problem limited your work or other regular daily activities Not at all    Arm, shoulder, or hand pain. None    Tingling (pins and needles) in your arm, shoulder, or hand None    Difficulty Sleeping No difficulty    DASH Score 0 %  TREATMENT DATE:  03/23/24: See education section  PATIENT EDUCATION:  Education details: lymphedema risk reduction practices - issued handout, emailed pt link to ABC class about reducing risk of lymphedema, discussed wearing compression sleeve when doing activities with increased movement of LUE, educated pt about SOZO and how it works to assess for subclinical lymphedema.  Person educated: Patient Education method: Chief Technology Officer Education comprehension: verbalized understanding  HOME EXERCISE PROGRAM: None  ASSESSMENT:  CLINICAL IMPRESSION: Patient is a 63 y.o. female who was seen today for physical therapy  evaluation and treatment to learn about how to reduce her risk of developing lymphedema. Pt had initial baseline SOZO evaluation in 2024 but has not had a follow up. Completed ldex screening today and her number was only slightly elevated from baseline so she does not have subclinical lymphedema. Pt was eager to learn how to reduce her risk of developing lymphedema. Educated pt in lymphedema risk reduction practices and issued handout as well as link to video for decreasing lymphedema risk. Pt has no other needs for skilled PT services and will be discharged from skilled PT services at this time.     OBJECTIVE IMPAIRMENTS: decreased knowledge of condition.   ACTIVITY LIMITATIONS: none  PARTICIPATION LIMITATIONS: none  PERSONAL FACTORS: none are also affecting patient's functional outcome.   REHAB POTENTIAL: Excellent  CLINICAL DECISION MAKING: Stable/uncomplicated  EVALUATION COMPLEXITY: Low  GOALS: Goals reviewed with patient? Yes  SHORT TERM GOALS=LONG TERM GOALS Target date: 03/23/24  Pt will be educated in lymphedema risk reduction practices to decrease risk of developing lymphedema in the future.  Baseline: Goal status: MET  PLAN:  PT FREQUENCY: eval only  PLANNED INTERVENTIONS: [id number]- Self Care, Patient/Family education, Balance training, Joint mobilization, Therapeutic exercises, Therapeutic activity, Neuromuscular re-education, Gait training, and Self Care  PLAN FOR NEXT SESSION: eval only, continue ldex screenings every 3 months for 2-3 years post op   [employer], PT 03/23/2024, 11:15 AM

## 2024-03-23 ENCOUNTER — Encounter: Payer: Self-pay | Admitting: Physical Therapy

## 2024-03-23 ENCOUNTER — Ambulatory Visit: Attending: Hematology and Oncology | Admitting: Physical Therapy

## 2024-03-23 ENCOUNTER — Other Ambulatory Visit: Payer: Self-pay

## 2024-03-23 DIAGNOSIS — Z171 Estrogen receptor negative status [ER-]: Secondary | ICD-10-CM | POA: Diagnosis present

## 2024-03-23 DIAGNOSIS — C50412 Malignant neoplasm of upper-outer quadrant of left female breast: Secondary | ICD-10-CM | POA: Diagnosis present

## 2024-03-23 DIAGNOSIS — I890 Lymphedema, not elsewhere classified: Secondary | ICD-10-CM | POA: Diagnosis present

## 2024-03-23 DIAGNOSIS — L599 Disorder of the skin and subcutaneous tissue related to radiation, unspecified: Secondary | ICD-10-CM | POA: Insufficient documentation

## 2024-03-24 ENCOUNTER — Encounter: Payer: Self-pay | Admitting: Hematology and Oncology

## 2024-03-29 ENCOUNTER — Other Ambulatory Visit (HOSPITAL_BASED_OUTPATIENT_CLINIC_OR_DEPARTMENT_OTHER)

## 2024-04-13 ENCOUNTER — Encounter: Payer: Self-pay | Admitting: Adult Health

## 2024-04-28 ENCOUNTER — Ambulatory Visit (HOSPITAL_BASED_OUTPATIENT_CLINIC_OR_DEPARTMENT_OTHER)
Admission: RE | Admit: 2024-04-28 | Discharge: 2024-04-28 | Disposition: A | Discharge status: Home / Self Care | Source: Ambulatory Visit | Attending: Adult Health | Admitting: Adult Health

## 2024-04-28 DIAGNOSIS — Z13820 Encounter for screening for osteoporosis: Secondary | ICD-10-CM | POA: Diagnosis present

## 2024-04-29 ENCOUNTER — Ambulatory Visit: Payer: Self-pay

## 2024-05-27 ENCOUNTER — Encounter

## 2024-06-13 ENCOUNTER — Ambulatory Visit

## 2025-03-16 ENCOUNTER — Inpatient Hospital Stay: Admitting: Hematology and Oncology
